# Patient Record
Sex: Female | Born: 1990 | Race: White | Hispanic: No | Marital: Single | Smoking: Former smoker
Health system: Southern US, Community
[De-identification: ages and names within clinical notes are randomized; demographics above are authoritative.]

## PROBLEM LIST (undated history)

## (undated) ENCOUNTER — Inpatient Hospital Stay (HOSPITAL_COMMUNITY): Admission: RE | Payer: Self-pay | Source: Ambulatory Visit

## (undated) ENCOUNTER — Inpatient Hospital Stay (HOSPITAL_COMMUNITY): Payer: Self-pay

## (undated) DIAGNOSIS — Z803 Family history of malignant neoplasm of breast: Secondary | ICD-10-CM

## (undated) DIAGNOSIS — E079 Disorder of thyroid, unspecified: Secondary | ICD-10-CM

## (undated) DIAGNOSIS — B999 Unspecified infectious disease: Secondary | ICD-10-CM

## (undated) DIAGNOSIS — R51 Headache: Secondary | ICD-10-CM

## (undated) DIAGNOSIS — K219 Gastro-esophageal reflux disease without esophagitis: Secondary | ICD-10-CM

## (undated) HISTORY — DX: Family history of malignant neoplasm of breast: Z80.3

## (undated) HISTORY — DX: Gastro-esophageal reflux disease without esophagitis: K21.9

## (undated) HISTORY — PX: MOUTH SURGERY: SHX715

---
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
## 2010-10-03 LAB — URINALYSIS, ROUTINE W REFLEX MICROSCOPIC
Bilirubin Urine: NEGATIVE
Ketones, ur: NEGATIVE
Specific Gravity, Urine: 1.016
Urobilinogen, UA: 1
pH: 6

## 2010-10-03 LAB — URINE MICROSCOPIC-ADD ON

## 2010-10-03 LAB — URINE CULTURE: Colony Count: 50000

## 2012-07-07 LAB — CULTURE, OB URINE: Urine Culture, OB: NEGATIVE

## 2012-07-07 LAB — CYSTIC FIBROSIS DIAGNOSTIC STUDY: INTERPRETATION-CFDNA: NEGATIVE

## 2012-07-07 LAB — OB RESULTS CONSOLE HIV ANTIBODY (ROUTINE TESTING): HIV: NONREACTIVE

## 2012-07-07 LAB — OB RESULTS CONSOLE RPR: RPR: NONREACTIVE

## 2012-07-07 LAB — OB RESULTS CONSOLE GC/CHLAMYDIA
CHLAMYDIA, DNA PROBE: NEGATIVE
GC PROBE AMP, GENITAL: NEGATIVE

## 2012-07-07 LAB — OB RESULTS CONSOLE HEPATITIS B SURFACE ANTIGEN: Hepatitis B Surface Ag: NEGATIVE

## 2012-07-07 LAB — OB RESULTS CONSOLE PLATELET COUNT: Platelets: 243 10*3/uL

## 2012-07-07 LAB — OB RESULTS CONSOLE RUBELLA ANTIBODY, IGM: Rubella: IMMUNE

## 2012-07-07 LAB — OB RESULTS CONSOLE HGB/HCT, BLOOD
HCT: 42 %
Hemoglobin: 14.4 g/dL

## 2012-07-07 LAB — CYTOLOGY - PAP: Pap: NEGATIVE

## 2012-11-24 LAB — OB RESULTS CONSOLE HGB/HCT, BLOOD
HEMATOCRIT: 38 %
Hemoglobin: 12.6 g/dL

## 2013-01-04 ENCOUNTER — Inpatient Hospital Stay (HOSPITAL_COMMUNITY)
Admission: AD | Admit: 2013-01-04 | Discharge: 2013-01-04 | Disposition: A | Discharge status: Home / Self Care | Payer: Medicaid - Out of State | Source: Ambulatory Visit | Attending: Obstetrics & Gynecology | Admitting: Obstetrics & Gynecology

## 2013-01-04 ENCOUNTER — Encounter (HOSPITAL_COMMUNITY): Payer: Self-pay | Admitting: *Deleted

## 2013-01-04 DIAGNOSIS — O4700 False labor before 37 completed weeks of gestation, unspecified trimester: Secondary | ICD-10-CM | POA: Insufficient documentation

## 2013-01-04 DIAGNOSIS — Z87891 Personal history of nicotine dependence: Secondary | ICD-10-CM | POA: Insufficient documentation

## 2013-01-04 DIAGNOSIS — R109 Unspecified abdominal pain: Secondary | ICD-10-CM | POA: Insufficient documentation

## 2013-01-04 DIAGNOSIS — R42 Dizziness and giddiness: Secondary | ICD-10-CM

## 2013-01-04 DIAGNOSIS — N898 Other specified noninflammatory disorders of vagina: Secondary | ICD-10-CM

## 2013-01-04 DIAGNOSIS — M549 Dorsalgia, unspecified: Secondary | ICD-10-CM | POA: Insufficient documentation

## 2013-01-04 HISTORY — DX: Disorder of thyroid, unspecified: E07.9

## 2013-01-04 HISTORY — DX: Headache: R51

## 2013-01-04 LAB — URINE MICROSCOPIC-ADD ON

## 2013-01-04 LAB — WET PREP, GENITAL
Trich, Wet Prep: NONE SEEN
Yeast Wet Prep HPF POC: NONE SEEN

## 2013-01-04 LAB — FETAL FIBRONECTIN: Fetal Fibronectin: NEGATIVE

## 2013-01-04 LAB — URINALYSIS, ROUTINE W REFLEX MICROSCOPIC
Bilirubin Urine: NEGATIVE
Glucose, UA: NEGATIVE mg/dL
Ketones, ur: NEGATIVE mg/dL
pH: 6 (ref 5.0–8.0)

## 2013-01-04 MED ORDER — LACTATED RINGERS IV BOLUS (SEPSIS)
1000.0000 mL | Freq: Once | INTRAVENOUS | Status: AC
  Administered 2013-01-04: 1000 mL via INTRAVENOUS

## 2013-01-04 MED ORDER — METRONIDAZOLE 500 MG PO TABS: 500.0000 mg | ORAL_TABLET | Freq: Two times a day (BID) | ORAL | Status: DC

## 2013-01-04 NOTE — Progress Notes (Signed)
Patient states she had episodes of dizziness and fainting when she was around 22 years old. Unknown etiology. States she had none of this with prior pregnancy. States after she delivered, she had problems with thyroid and was on medication for about 1 year. States she stopped medication on her own due to loss of insurance. At some point when tested again, values were normal so medication not resumed. States she has had episodes of dizziness with this pregnancy and passed out one time.

## 2013-01-04 NOTE — MAU Note (Addendum)
Hasn't been seen in a month, recently moved here.  Sent over by [facility] for check up (they have her prenatal record), does not have an appt yet, waiting on insurance.  Has been having a lot of pain, stretching in upper abd and back pain. Mild UTI symptoms

## 2013-01-04 NOTE — MAU Provider Note (Signed)
History     CSN: [id number]  Arrival date and time: 01/04/13 1352   None     Chief Complaint  Patient presents with  . Abdominal Pain  . Back Pain   HPI Dana Gonzalez is a 22 y.o. G3P1101 at [redacted]w[redacted]d by pt report of R=21. Pt has transferred her care from [facility] and has not yet established care in the area. Pt is currently trying to adjust her medicaid to the area. Pt states she is here to "get checked out." on further review of systems, pt reports feeling dizzy when standing or walking for prolonged periods of time, Pt also reports 1 episode of syncope. Pt reports poor fluid intake. Normal fetal movement, no LOF, no VB, ?ctx. When she walks from >44min she starts having pain in her back that moves to the front of her abdomen.   Pt states that her first pregnancy was induced at 38wks for size and her second pregnancy was and IUFD from an umbilical cord complication at 8 months. Reports no problems with HTN or Diabetes with this pregnancy. Reports not needing thyroid medication this pregnancy as well.  OB History   Grav Para Term Preterm Abortions TAB SAB Ect Mult Living   3 2 1 1      1       Past Medical History  Diagnosis Date  . Thyroid disease   . NWGNFAOZ(308.6)     Past Surgical History  Procedure Laterality Date  . Vaginal delivery      X 2  . Mouth surgery      Family History  Problem Relation Age of Onset  . Cancer Mother   . Hypertension Father   . Diabetes Maternal Grandmother   . Heart disease Paternal Grandmother     History  Substance Use Topics  . Smoking status: Former Smoker    Types: Cigarettes  . Smokeless tobacco: Never Used  . Alcohol Use: No    Allergies: Not on File  Prescriptions prior to admission  Medication Sig Dispense Refill  . calcium carbonate (TUMS - DOSED IN MG ELEMENTAL CALCIUM) 500 MG chewable tablet Chew 2 tablets by mouth at bedtime as needed for indigestion or heartburn.      . Prenatal Vit-Fe Fumarate-FA (PRENATAL  MULTIVITAMIN) TABS tablet Take 1 tablet by mouth daily at 12 noon.        ROS No f/c, sob, n/v, d/c, weakness, cp, headache, urinary symptoms, some constipation  Physical Exam   Blood pressure 118/78, pulse 95, temperature 98.3 F (36.8 C), temperature source Oral, resp. rate 18, height 5\' 5"  (1.651 m), weight 81.647 kg (180 lb), SpO2 99.00%.  Physical Exam VSS, NAD, +orthostatics based on pulse RRR no mgt CTAB no wrc Gravid, NTTP, ND No c/c/e Dilation: 3 Effacement (%): 20 Station: -2 Exam by:: Dr. Ike Gonzalez  SSE: white discharge, thick appearing cervix  FHT: 140s mod var, mult accels >15x15 no decels Toco: q101min ctx  Results for Dana, Gonzalez (MRN [id number]) as of 01/04/2013 15:56  Ref. Range 01/04/2013 14:19 01/04/2013 15:12  Color, Urine Latest Range: YELLOW  YELLOW   APPearance Latest Range: CLEAR  CLEAR   Specific Gravity, Urine Latest Range: 1.005-1.030  1.015   pH Latest Range: 5.0-8.0  6.0   Glucose Latest Range: NEGATIVE mg/dL NEGATIVE   Bilirubin Urine Latest Range: NEGATIVE  NEGATIVE   Ketones, ur Latest Range: NEGATIVE mg/dL NEGATIVE   Protein Latest Range: NEGATIVE mg/dL NEGATIVE   Urobilinogen, UA Latest Range: 0.0-1.0 mg/dL 0.2  Nitrite Latest Range: NEGATIVE  NEGATIVE   Leukocytes, UA Latest Range: NEGATIVE  SMALL (A)   Hgb urine dipstick Latest Range: NEGATIVE  NEGATIVE   WBC, UA Latest Range: <3 WBC/hpf 0-2   Squamous Epithelial / LPF Latest Range: RARE  MANY (A)   Bacteria, UA Latest Range: RARE  FEW (A)   Fetal Fibronectin Latest Range: NEGATIVE   NEGATIVE   Results for Dana, Gonzalez (MRN [id number]) as of 01/04/2013 16:24  Ref. Range 01/04/2013 15:45  Yeast Wet Prep HPF POC Latest Range: NONE SEEN  NONE SEEN  Trich, Wet Prep Latest Range: NONE SEEN  NONE SEEN  Clue Cells Wet Prep HPF POC Latest Range: NONE SEEN  FEW (A)  WBC, Wet Prep HPF POC Latest Range: NONE SEEN  FEW (A)    MAU Course  Procedures  MDM FFN, Wet mount, GC/C Requested  medical records   Assessment and Plan  Dana Gonzalez is a 22 y.o. G3P1101 at [redacted]w[redacted]d with care in [facility]. FFN negative. No steroids at this time. Will send message to our clinic to establish care. Recommend pt decrease her activity to a level below back pain/ctx. Pt to increase hydration with 1/2 gatorade 1/2 water. Discharged at this time.  +clue cells, and discharge, will tx for BV with flagyl 500mg  BID x7d Dana Gonzalez 01/04/2013, 3:48 PM

## 2013-01-04 NOTE — MAU Note (Signed)
Passed out when shopping a couple wks ago,  Any time she is standing or goes for a walk, she gets dizzy

## 2013-01-07 NOTE — L&D Delivery Note (Signed)
Delivery Note At 9:03 AM a viable female was delivered via Vaginal, Spontaneous Delivery (Presentation: Left Occiput Anterior).  APGAR: 9, 9; weight .   Placenta status: Intact, Spontaneous.  Cord: 3 vessels with the following complications: None.   Anesthesia: Epidural  Episiotomy: None Lacerations: 2nd degree Suture Repair: 3.0 vicryl Est. Blood Loss (mL): 350  Mom to postpartum.  Baby to Couplet care / Skin to Skin.  Mother admitted in active labor and was augmented with pit. Progressed to complete. Came to room with 3 sets of contractions delivered NSVD over 2nd degree tear. Active management with pit and traction delivered intact placenta w/ 3v cord. Tear repaired in usual manner with 3.0 vicryl on CT. EBL 350. Uterine sweep with several large clots then hemostatic. Counts correct.  Fredrik Rigger 02/07/2013, 9:26 AM

## 2013-01-10 ENCOUNTER — Inpatient Hospital Stay (HOSPITAL_COMMUNITY)
Admission: AD | Admit: 2013-01-10 | Discharge: 2013-01-11 | Disposition: A | Discharge status: Home / Self Care | Payer: Medicaid - Out of State | Source: Ambulatory Visit | Attending: Obstetrics & Gynecology | Admitting: Obstetrics & Gynecology

## 2013-01-10 ENCOUNTER — Encounter (HOSPITAL_COMMUNITY): Payer: Self-pay | Admitting: *Deleted

## 2013-01-10 DIAGNOSIS — O479 False labor, unspecified: Secondary | ICD-10-CM

## 2013-01-10 DIAGNOSIS — O4700 False labor before 37 completed weeks of gestation, unspecified trimester: Secondary | ICD-10-CM | POA: Insufficient documentation

## 2013-01-10 DIAGNOSIS — Z87891 Personal history of nicotine dependence: Secondary | ICD-10-CM | POA: Insufficient documentation

## 2013-01-10 NOTE — MAU Note (Signed)
Pt G3 P1 at 34.1wks with contractions every 61min.  SVE 3cm on 12/30.

## 2013-01-11 DIAGNOSIS — O479 False labor, unspecified: Secondary | ICD-10-CM

## 2013-01-11 LAB — URINALYSIS, ROUTINE W REFLEX MICROSCOPIC
Bilirubin Urine: NEGATIVE
Glucose, UA: NEGATIVE mg/dL
Hgb urine dipstick: NEGATIVE
Ketones, ur: NEGATIVE mg/dL
Leukocytes, UA: NEGATIVE
NITRITE: NEGATIVE
PROTEIN: NEGATIVE mg/dL
UROBILINOGEN UA: 0.2 mg/dL (ref 0.0–1.0)
pH: 6 (ref 5.0–8.0)

## 2013-01-11 MED ORDER — ONDANSETRON 4 MG PO TBDP
4.0000 mg | ORAL_TABLET | Freq: Once | ORAL | Status: AC
  Administered 2013-01-11: 4 mg via ORAL
  Filled 2013-01-11: qty 1

## 2013-01-11 MED ORDER — OXYCODONE-ACETAMINOPHEN 5-325 MG PO TABS
2.0000 | ORAL_TABLET | Freq: Once | ORAL | Status: AC
  Administered 2013-01-11: 2 via ORAL
  Filled 2013-01-11: qty 2

## 2013-01-11 NOTE — MAU Provider Note (Signed)
History     CSN: [id number]  Arrival date and time: 01/10/13 2313   None     Chief Complaint  Patient presents with  . Contractions   HPI Dana Gonzalez is a 23 y.o. G3P1101 at [redacted]w[redacted]d by pt report of R=21 who presents for 3 hours of regular contractions.  Pt has transferred her care from [facility] and has not yet established care in the area.  Pt reports poor fluid intake- only drinks [place] tea all day long. Normal fetal movement, no LOF, no VB. Was seen in MAU last week and was contracting on toco without feeling them at that time and had a negative FFN.   Pt states that her first pregnancy was induced at 38wks for size and her second pregnancy was and IUFD from an umbilical cord complication at 8 months. Reports no problems with HTN or Diabetes with this pregnancy. Reports not needing thyroid medication this pregnancy as well although has needed it in the past.   OB History   Grav Para Term Preterm Abortions TAB SAB Ect Mult Living   3 2 1 1      1       Past Medical History  Diagnosis Date  . Thyroid disease   . WYOVZCHY(850.2)     Past Surgical History  Procedure Laterality Date  . Vaginal delivery      X 2  . Mouth surgery      Family History  Problem Relation Age of Onset  . Cancer Mother   . Hypertension Father   . Diabetes Maternal Grandmother   . Heart disease Paternal Grandmother     History  Substance Use Topics  . Smoking status: Former Smoker    Types: Cigarettes  . Smokeless tobacco: Never Used  . Alcohol Use: No    Allergies: No Known Allergies  Prescriptions prior to admission  Medication Sig Dispense Refill  . calcium carbonate (TUMS - DOSED IN MG ELEMENTAL CALCIUM) 500 MG chewable tablet Chew 2 tablets by mouth at bedtime as needed for indigestion or heartburn.      . metroNIDAZOLE (FLAGYL) 500 MG tablet Take 1 tablet (500 mg total) by mouth 2 (two) times daily.  14 tablet  0  . Prenatal Vit-Fe Fumarate-FA (PRENATAL MULTIVITAMIN) TABS tablet Take  1 tablet by mouth daily at 12 noon.        ROS No f/c, sob, n/v, d/c, weakness, cp, headache, urinary symptoms, some constipation  Physical Exam   Blood pressure 132/67, pulse 100, temperature 98 F (36.7 C), temperature source Oral, resp. rate 18, height 5\' 7"  (1.702 m), weight 82.736 kg (182 lb 6.4 oz).  Physical Exam VSS, NAD,  RRR no m/r/g CTAB  Gravid, NTTP, ND No c/c/e Dilation: 3 Effacement (%): 30 Station: -3 Presentation: Vertex Exam by:: Dana Becton MD   FHT: 145, mod var, mult accels >15x15 no decels Toco: q2-42min ctx  Results for HELIA, HAESE (MRN [id number]) as of 01/04/2013 15:56  Ref. Range 01/04/2013 14:19 01/04/2013 15:12  Color, Urine Latest Range: YELLOW  YELLOW   APPearance Latest Range: CLEAR  CLEAR   Specific Gravity, Urine Latest Range: 1.005-1.030  1.015   pH Latest Range: 5.0-8.0  6.0   Glucose Latest Range: NEGATIVE mg/dL NEGATIVE   Bilirubin Urine Latest Range: NEGATIVE  NEGATIVE   Ketones, ur Latest Range: NEGATIVE mg/dL NEGATIVE   Protein Latest Range: NEGATIVE mg/dL NEGATIVE   Urobilinogen, UA Latest Range: 0.0-1.0 mg/dL 0.2   Nitrite Latest Range: NEGATIVE  NEGATIVE  Leukocytes, UA Latest Range: NEGATIVE  SMALL (A)   Hgb urine dipstick Latest Range: NEGATIVE  NEGATIVE   WBC, UA Latest Range: <3 WBC/hpf 0-2   Squamous Epithelial / LPF Latest Range: RARE  MANY (A)   Bacteria, UA Latest Range: RARE  FEW (A)   Fetal Fibronectin Latest Range: NEGATIVE   NEGATIVE   Results for PHILLIP, SANDLER (MRN [id number]) as of 01/04/2013 16:24  Ref. Range 01/04/2013 15:45  Yeast Wet Prep HPF POC Latest Range: NONE SEEN  NONE SEEN  Trich, Wet Prep Latest Range: NONE SEEN  NONE SEEN  Clue Cells Wet Prep HPF POC Latest Range: NONE SEEN  FEW (A)  WBC, Wet Prep HPF POC Latest Range: NONE SEEN  FEW (A)    MAU Course  Procedures     Assessment and Plan  Dana Gonzalez is a 23 y.o. G3P1101 at [redacted]w[redacted]d with care in [facility] and here for contraction pain.    Cervix unchanged from one week ago No indications for steroids, FFN or tocolysis given GA.  Based on story, suspect some component of dehydration and too much caffeine Po hydrated here in the MAU with improvement in symptoms but continued contractions on TOCO - pt comfortable though and palpating mild on exam Cervix rechecked in 2 hours and unchanged- likely braxton hicks Finish last day of flagyl for BV.  - cont rehydrating with things that are noncaffeinated  F/u as scheduled in [facility] on 1/7 FWB- cat I tracing    PTL precautions discussed   Rip Hawes L 01/11/2013, 12:27 AM

## 2013-01-11 NOTE — Discharge Instructions (Signed)

## 2013-01-12 NOTE — MAU Provider Note (Signed)
Attestation of Attending Supervision of Fellow: Evaluation and management procedures were performed by the Fellow under my supervision and collaboration.  I have reviewed the Fellow's note and chart, and I agree with the management and plan.    

## 2013-01-13 ENCOUNTER — Encounter: Payer: Self-pay | Admitting: Family Medicine

## 2013-01-21 ENCOUNTER — Inpatient Hospital Stay (HOSPITAL_COMMUNITY)
Admission: AD | Admit: 2013-01-21 | Discharge: 2013-01-21 | Disposition: A | Discharge status: Home / Self Care | Payer: Medicaid Other | Source: Ambulatory Visit | Attending: Obstetrics & Gynecology | Admitting: Obstetrics & Gynecology

## 2013-01-21 ENCOUNTER — Inpatient Hospital Stay (HOSPITAL_COMMUNITY): Payer: Medicaid Other

## 2013-01-21 ENCOUNTER — Telehealth: Payer: Self-pay | Admitting: General Practice

## 2013-01-21 ENCOUNTER — Encounter (HOSPITAL_COMMUNITY): Payer: Self-pay | Admitting: *Deleted

## 2013-01-21 DIAGNOSIS — R109 Unspecified abdominal pain: Secondary | ICD-10-CM | POA: Insufficient documentation

## 2013-01-21 DIAGNOSIS — O4690 Antepartum hemorrhage, unspecified, unspecified trimester: Secondary | ICD-10-CM

## 2013-01-21 DIAGNOSIS — O4700 False labor before 37 completed weeks of gestation, unspecified trimester: Secondary | ICD-10-CM | POA: Insufficient documentation

## 2013-01-21 LAB — CBC
HCT: 35.6 % — ABNORMAL LOW (ref 36.0–46.0)
Hemoglobin: 12.4 g/dL (ref 12.0–15.0)
MCH: 32.9 pg (ref 26.0–34.0)
MCHC: 34.8 g/dL (ref 30.0–36.0)
MCV: 94.4 fL (ref 78.0–100.0)
PLATELETS: 180 10*3/uL (ref 150–400)
RBC: 3.77 MIL/uL — AB (ref 3.87–5.11)
RDW: 14 % (ref 11.5–15.5)
WBC: 8.6 10*3/uL (ref 4.0–10.5)

## 2013-01-21 LAB — URINALYSIS, ROUTINE W REFLEX MICROSCOPIC
BILIRUBIN URINE: NEGATIVE
GLUCOSE, UA: NEGATIVE mg/dL
KETONES UR: NEGATIVE mg/dL
Nitrite: NEGATIVE
PH: 7.5 (ref 5.0–8.0)
PROTEIN: NEGATIVE mg/dL
Specific Gravity, Urine: <1.005 — ABNORMAL LOW (ref 1.005–1.030)
Urobilinogen, UA: 0.2 mg/dL (ref 0.0–1.0)

## 2013-01-21 LAB — URINE MICROSCOPIC-ADD ON

## 2013-01-21 LAB — TYPE AND SCREEN
ABO/RH(D): A POS
Antibody Screen: NEGATIVE

## 2013-01-21 LAB — ABO/RH: ABO/RH(D): A POS

## 2013-01-21 NOTE — MAU Note (Signed)
Pt reports having some vaginal bleeding that started today. reports some mild cramping and good fetal movement.

## 2013-01-21 NOTE — MAU Provider Note (Signed)
None     Chief Complaint:  Vaginal Bleeding   Dana Gonzalez is  23 y.o. G3P1101 at [redacted]w[redacted]d presents complaining of Vaginal Bleeding Pt reports today she had vaginal bleeding on wiping after urination. No blood in the toilet only on TP. Pt reports mild cramping, excellent fetal movement, no LOF. Pt has hx of third trimester IUFD at ~34wk. Pt here for evaluation of bleeding  Otherwise pt reports in normal state of health. Recently transferred care to [facility] from out of state and has not been seen at clinic yet. Review of patient records unable to find blood type or 20wk [place].  Obstetrical/Gynecological History: OB History   Grav Para Term Preterm Abortions TAB SAB Ect Mult Living   3 2 1 1      1      Past Medical History: Past Medical History  Diagnosis Date  . Thyroid disease   . ONGEXBMW(413.2)     Past Surgical History: Past Surgical History  Procedure Laterality Date  . Vaginal delivery      X 2  . Mouth surgery      Family History: Family History  Problem Relation Age of Onset  . Cancer Mother   . Hypertension Father   . Diabetes Maternal Grandmother   . Heart disease Paternal Grandmother     Social History: History  Substance Use Topics  . Smoking status: Former Smoker    Types: Cigarettes  . Smokeless tobacco: Never Used  . Alcohol Use: No    Allergies: No Known Allergies  Meds:  Prescriptions prior to admission  Medication Sig Dispense Refill  . calcium carbonate (TUMS - DOSED IN MG ELEMENTAL CALCIUM) 500 MG chewable tablet Chew 2 tablets by mouth at bedtime as needed for indigestion or heartburn.      . Prenatal Vit-Fe Fumarate-FA (PRENATAL MULTIVITAMIN) TABS tablet Take 1 tablet by mouth daily at 12 noon.        Review of Systems -   Review of Systems  No f/c, sob, nv, d/c, ha, weaknnes, CP, urinary symptoms or other complaints at [facility] stime.   Physical Exam  Blood pressure 107/71, pulse 121, temperature 98.2 F (36.8 C), temperature source  Oral, resp. rate 18. GENERAL: Well-developed, well-nourished female in no acute distress.  LUNGS: Clear to auscultation bilaterally.  HEART: Regular rate and rhythm. ABDOMEN: Soft, nontender, nondistended, gravid.  EXTREMITIES: Nontender, no edema, 2+ distal pulses. Dilation: 1 Effacement (%): 30 Cervical Position: Posterior Station: -3 Presentation: Vertex Exam by:: Dr. Leslie Andrea  Presentation: cephalic  SSE: dark red blood in mucous from cervical Os. Os appeared closed  FHT:  Baseline rate 150-160s bpm   Variability moderate  Accelerations present   Decelerations none Contractions: uterine irritability   Labs: Results for orders placed during the hospital encounter of 01/21/13 (from the past 24 hour(s))  CBC   Collection Time    01/21/13  1:25 PM      Result Value Range   WBC 8.6  4.0 - 10.5 K/uL   RBC 3.77 (*) 3.87 - 5.11 MIL/uL   Hemoglobin 12.4  12.0 - 15.0 g/dL   HCT 35.6 (*) 36.0 - 46.0 %   MCV 94.4  78.0 - 100.0 fL   MCH 32.9  26.0 - 34.0 pg   MCHC 34.8  30.0 - 36.0 g/dL   RDW 14.0  11.5 - 15.5 %   Platelets 180  150 - 400 K/uL   Imaging Studies:  No results found.  MDM: unable to find [place]  or blood type from [facility]. Will eval for palcenta location and for abruption. Will also collect blood type for possible rhogam.  Assessment: Dana Gonzalez is  23 y.o. G3P1101 at [redacted]w[redacted]d presents with minimal vaginal bleeding from the OS. Likely related to cervical change. Reassuring placenta location, non painful, low concern for abruption. Reassuring fetal status. Encouraged pt to have strict PTl precautions. Pt comfortable with discharge.  Discussed with Dr. Fanny Dance, Lyda Kalata 1/15/20151:48 PM

## 2013-01-21 NOTE — Discharge Instructions (Signed)
Vaginal Bleeding During Pregnancy, Third Trimester A small amount of bleeding (spotting) from the vagina is relatively common in pregnancy. Various things can cause bleeding or spotting in pregnancy. Sometimes the bleeding is normal and is not a problem. However, bleeding during the third trimester can also be a sign of something serious for the mother and the baby. Be sure to tell your health care provider about any vaginal bleeding right away.  Some possible causes of vaginal bleeding during the third trimester include:   The placenta may be partially or completely covering the opening to the cervix (placenta previa).   The placenta may have separated from the uterus (abruption of the placenta).   There may be an infection or growth on the cervix.   You may be starting labor, called discharging of the mucus plug.   The placenta may grow into the muscle layer of the uterus (placenta accreta).  HOME CARE INSTRUCTIONS  Watch your condition for any changes. The following actions may help to lessen any discomfort you are feeling:   Follow your health care provider's instructions for limiting your activity. If your health care provider orders bed rest, you may need to stay in bed and only get up to use the bathroom. However, your health care provider may allow you to continue light activity.  If needed, make plans for someone to help with your regular activities and responsibilities while you are on bed rest.  Keep track of the number of pads you use each day, how often you change pads, and how soaked (saturated) they are. Write this down.  Do not use tampons. Do not douche.  Do not have sexual intercourse or orgasms until approved by your health care provider.  Follow your health care provider's advice about lifting, driving, and physical activities.  If you pass any tissue from your vagina, save the tissue so you can show it to your health care provider.   Only take over-the-counter  or prescription medicines as directed by your health care provider.  Do not take aspirin because it can make you bleed.   Keep all follow-up appointments as directed by your health care provider. SEEK MEDICAL CARE IF:  You have any vaginal bleeding during any part of your pregnancy.  You have cramps or labor pains. SEEK IMMEDIATE MEDICAL CARE IF:   You have severe cramps or pain in your back or belly (abdomen).  You have a fever, not controlled by medicine.  You have chills.  You have a gush of fluid from the vagina.  You pass large clots or tissue from your vagina.  Your bleeding increases.  You feel lightheaded or weak.  You pass out.  You feel less movement or no movement of the baby.  MAKE SURE YOU:  Understand these instructions.  Will watch your condition.  Will get help right away if you are not doing well or get worse. Document Released: 03/16/2002 Document Revised: 10/14/2012 Document Reviewed: 08/31/2012 Exit[employer] Patient Information 2014 [employer].  

## 2013-01-21 NOTE — Telephone Encounter (Signed)
Patient called in to front office stating she just went to the bathroom and saw some bright red blood when she wiped and she hasn't been seen here in our office yet but has an appt. Asked patient to quantify the amount of blood and if she had had sex recently. Patient stated she hasn't had sex recently and there was enough blood to worry her. Encouraged her to go to MAU for evaluation to see what's going on but not to worry at this point. Patient verbalized understanding stated she would go and had no further questions

## 2013-01-25 ENCOUNTER — Encounter: Payer: Self-pay | Admitting: Obstetrics and Gynecology

## 2013-01-25 ENCOUNTER — Ambulatory Visit (INDEPENDENT_AMBULATORY_CARE_PROVIDER_SITE_OTHER): Payer: Medicaid Other | Admitting: Obstetrics and Gynecology

## 2013-01-25 VITALS — BP 121/79 | Temp 97.0°F | Wt 181.7 lb

## 2013-01-25 DIAGNOSIS — O0933 Supervision of pregnancy with insufficient antenatal care, third trimester: Secondary | ICD-10-CM | POA: Insufficient documentation

## 2013-01-25 DIAGNOSIS — O0993 Supervision of high risk pregnancy, unspecified, third trimester: Secondary | ICD-10-CM | POA: Insufficient documentation

## 2013-01-25 DIAGNOSIS — O0930 Supervision of pregnancy with insufficient antenatal care, unspecified trimester: Secondary | ICD-10-CM

## 2013-01-25 DIAGNOSIS — Z8759 Personal history of other complications of pregnancy, childbirth and the puerperium: Secondary | ICD-10-CM | POA: Insufficient documentation

## 2013-01-25 DIAGNOSIS — O3660X0 Maternal care for excessive fetal growth, unspecified trimester, not applicable or unspecified: Secondary | ICD-10-CM

## 2013-01-25 DIAGNOSIS — O09299 Supervision of pregnancy with other poor reproductive or obstetric history, unspecified trimester: Secondary | ICD-10-CM

## 2013-01-25 LAB — POCT URINALYSIS DIP (DEVICE)
Bilirubin Urine: NEGATIVE
GLUCOSE, UA: NEGATIVE mg/dL
KETONES UR: NEGATIVE mg/dL
Nitrite: NEGATIVE
Protein, ur: NEGATIVE mg/dL
SPECIFIC GRAVITY, URINE: 1.015 (ref 1.005–1.030)
Urobilinogen, UA: 0.2 mg/dL (ref 0.0–1.0)
pH: 7 (ref 5.0–8.0)

## 2013-01-25 LAB — OB RESULTS CONSOLE GBS: GBS: POSITIVE

## 2013-01-25 NOTE — Patient Instructions (Signed)
Third Trimester of Pregnancy The third trimester is from week 29 through week 42, months 7 through 9. The third trimester is a time when the fetus is growing rapidly. At the end of the ninth month, the fetus is about 20 inches in length and weighs 6 10 pounds.  BODY CHANGES Your body goes through many changes during pregnancy. The changes vary from woman to woman.   Your weight will continue to increase. You can expect to gain 25 35 pounds (11 16 kg) by the end of the pregnancy.  You may begin to get stretch marks on your hips, abdomen, and breasts.  You may urinate more often because the fetus is moving lower into your pelvis and pressing on your bladder.  You may develop or continue to have heartburn as a result of your pregnancy.  You may develop constipation because certain hormones are causing the muscles that push waste through your intestines to slow down.  You may develop hemorrhoids or swollen, bulging veins (varicose veins).  You may have pelvic pain because of the weight gain and pregnancy hormones relaxing your joints between the bones in your pelvis. Back aches may result from over exertion of the muscles supporting your posture.  Your breasts will continue to grow and be tender. A yellow discharge may leak from your breasts called colostrum.  Your belly button may stick out.  You may feel short of breath because of your expanding uterus.  You may notice the fetus "dropping," or moving lower in your abdomen.  You may have a bloody mucus discharge. This usually occurs a few days to a week before labor begins.  Your cervix becomes thin and soft (effaced) near your due date. WHAT TO EXPECT AT YOUR PRENATAL EXAMS  You will have prenatal exams every 2 weeks until week 36. Then, you will have weekly prenatal exams. During a routine prenatal visit:  You will be weighed to make sure you and the fetus are growing normally.  Your blood pressure is taken.  Your abdomen will  be measured to track your baby's growth.  The fetal heartbeat will be listened to.  Any test results from the previous visit will be discussed.  You may have a cervical check near your due date to see if you have effaced. At around 36 weeks, your caregiver will check your cervix. At the same time, your caregiver will also perform a test on the secretions of the vaginal tissue. This test is to determine if a type of bacteria, Group B streptococcus, is present. Your caregiver will explain this further. Your caregiver may ask you:  What your birth plan is.  How you are feeling.  If you are feeling the baby move.  If you have had any abnormal symptoms, such as leaking fluid, bleeding, severe headaches, or abdominal cramping.  If you have any questions. Other tests or screenings that may be performed during your third trimester include:  Blood tests that check for low iron levels (anemia).  Fetal testing to check the health, activity level, and growth of the fetus. Testing is done if you have certain medical conditions or if there are problems during the pregnancy. FALSE LABOR You may feel small, irregular contractions that eventually go away. These are called Braxton Hicks contractions, or false labor. Contractions may last for hours, days, or even weeks before true labor sets in. If contractions come at regular intervals, intensify, or become painful, it is best to be seen by your caregiver.    SIGNS OF LABOR   Menstrual-like cramps.  Contractions that are 5 minutes apart or less.  Contractions that start on the top of the uterus and spread down to the lower abdomen and back.  A sense of increased pelvic pressure or back pain.  A watery or bloody mucus discharge that comes from the vagina. If you have any of these signs before the 37th week of pregnancy, call your caregiver right away. You need to go to the hospital to get checked immediately. HOME CARE INSTRUCTIONS   Avoid all  smoking, herbs, alcohol, and unprescribed drugs. These chemicals affect the formation and growth of the baby.  Follow your caregiver's instructions regarding medicine use. There are medicines that are either safe or unsafe to take during pregnancy.  Exercise only as directed by your caregiver. Experiencing uterine cramps is a good sign to stop exercising.  Continue to eat regular, healthy meals.  Wear a good support bra for breast tenderness.  Do not use hot tubs, steam rooms, or saunas.  Wear your seat belt at all times when driving.  Avoid raw meat, uncooked cheese, cat litter boxes, and soil used by cats. These carry germs that can cause birth defects in the baby.  Take your prenatal vitamins.  Try taking a stool softener (if your caregiver approves) if you develop constipation. Eat more high-fiber foods, such as fresh vegetables or fruit and whole grains. Drink plenty of fluids to keep your urine clear or pale yellow.  Take warm sitz baths to soothe any pain or discomfort caused by hemorrhoids. Use hemorrhoid cream if your caregiver approves.  If you develop varicose veins, wear support hose. Elevate your feet for 15 minutes, 3 4 times a day. Limit salt in your diet.  Avoid heavy lifting, wear low heal shoes, and practice good posture.  Rest a lot with your legs elevated if you have leg cramps or low back pain.  Visit your dentist if you have not gone during your pregnancy. Use a soft toothbrush to brush your teeth and be gentle when you floss.  A sexual relationship may be continued unless your caregiver directs you otherwise.  Do not travel far distances unless it is absolutely necessary and only with the approval of your caregiver.  Take prenatal classes to understand, practice, and ask questions about the labor and delivery.  Make a trial run to the hospital.  Pack your hospital bag.  Prepare the baby's nursery.  Continue to go to all your prenatal visits as directed  by your caregiver. SEEK MEDICAL CARE IF:  You are unsure if you are in labor or if your water has broken.  You have dizziness.  You have mild pelvic cramps, pelvic pressure, or nagging pain in your abdominal area.  You have persistent nausea, vomiting, or diarrhea.  You have a bad smelling vaginal discharge.  You have pain with urination. SEEK IMMEDIATE MEDICAL CARE IF:   You have a fever.  You are leaking fluid from your vagina.  You have spotting or bleeding from your vagina.  You have severe abdominal cramping or pain.  You have rapid weight loss or gain.  You have shortness of breath with chest pain.  You notice sudden or extreme swelling of your face, hands, ankles, feet, or legs.  You have not felt your baby move in over an hour.  You have severe headaches that do not go away with medicine.  You have vision changes. Document Released: 12/18/2000 Document Revised: 08/26/2012 Document Reviewed:   02/25/2012 [employer] Patient Information 2014 [place].  Contraception Choices Contraception (birth control) is the use of any methods or devices to prevent pregnancy. Below are some methods to help avoid pregnancy. HORMONAL METHODS   Contraceptive implant This is a thin, plastic tube containing progesterone hormone. It does not contain estrogen hormone. Your health care provider inserts the tube in the inner part of the upper arm. The tube can remain in place for up to 3 years. After 3 years, the implant must be removed. The implant prevents the ovaries from releasing an egg (ovulation), thickens the cervical mucus to prevent sperm from entering the uterus, and thins the lining of the inside of the uterus.  Progesterone-only injections These injections are given every 3 months by your health care provider to prevent pregnancy. This synthetic progesterone hormone stops the ovaries from releasing eggs. It also thickens cervical mucus and changes the uterine lining. This  makes it harder for sperm to survive in the uterus.  Birth control pills These pills contain estrogen and progesterone hormone. They work by preventing the ovaries from releasing eggs (ovulation). They also cause the cervical mucus to thicken, preventing the sperm from entering the uterus. Birth control pills are prescribed by a health care provider.Birth control pills can also be used to treat heavy periods.  Minipill This type of birth control pill contains only the progesterone hormone. They are taken every day of each month and must be prescribed by your health care provider.  Birth control patch The patch contains hormones similar to those in birth control pills. It must be changed once a week and is prescribed by a health care provider.  Vaginal ring The ring contains hormones similar to those in birth control pills. It is left in the vagina for 3 weeks, removed for 1 week, and then a new one is put back in place. The patient must be comfortable inserting and removing the ring from the vagina.A health care provider's prescription is necessary.  Emergency contraception Emergency contraceptives prevent pregnancy after unprotected sexual intercourse. This pill can be taken right after sex or up to 5 days after unprotected sex. It is most effective the sooner you take the pills after having sexual intercourse. Most emergency contraceptive pills are available without a prescription. Check with your pharmacist. Do not use emergency contraception as your only form of birth control. BARRIER METHODS   Female condom This is a thin sheath (latex or rubber) that is worn over the penis during sexual intercourse. It can be used with spermicide to increase effectiveness.  Female condom. This is a soft, loose-fitting sheath that is put into the vagina before sexual intercourse.  Diaphragm This is a soft, latex, dome-shaped barrier that must be fitted by a health care provider. It is inserted into the vagina,  along with a spermicidal jelly. It is inserted before intercourse. The diaphragm should be left in the vagina for 6 to 8 hours after intercourse.  Cervical cap This is a round, soft, latex or plastic cup that fits over the cervix and must be fitted by a health care provider. The cap can be left in place for up to 48 hours after intercourse.  Sponge This is a soft, circular piece of polyurethane foam. The sponge has spermicide in it. It is inserted into the vagina after wetting it and before sexual intercourse.  Spermicides These are chemicals that kill or block sperm from entering the cervix and uterus. They come in the form of creams, jellies,  suppositories, foam, or tablets. They do not require a prescription. They are inserted into the vagina with an applicator before having sexual intercourse. The process must be repeated every time you have sexual intercourse. INTRAUTERINE CONTRACEPTION  Intrauterine device (IUD) This is a T-shaped device that is put in a woman's uterus during a menstrual period to prevent pregnancy. There are 2 types:  Copper IUD This type of IUD is wrapped in copper wire and is placed inside the uterus. Copper makes the uterus and fallopian tubes produce a fluid that kills sperm. It can stay in place for 10 years.  Hormone IUD This type of IUD contains the hormone progestin (synthetic progesterone). The hormone thickens the cervical mucus and prevents sperm from entering the uterus, and it also thins the uterine lining to prevent implantation of a fertilized egg. The hormone can weaken or kill the sperm that get into the uterus. It can stay in place for 3 5 years, depending on which type of IUD is used. PERMANENT METHODS OF CONTRACEPTION  Female tubal ligation This is when the woman's fallopian tubes are surgically sealed, tied, or blocked to prevent the egg from traveling to the uterus.  Hysteroscopic sterilization This involves placing a small coil or insert into each  fallopian tube. Your doctor uses a technique called hysteroscopy to do the procedure. The device causes scar tissue to form. This results in permanent blockage of the fallopian tubes, so the sperm cannot fertilize the egg. It takes about 3 months after the procedure for the tubes to become blocked. You must use another form of birth control for these 3 months.  Female sterilization This is when the female has the tubes that carry sperm tied off (vasectomy).This blocks sperm from entering the vagina during sexual intercourse. After the procedure, the man can still ejaculate fluid (semen). NATURAL PLANNING METHODS  Natural family planning This is not having sexual intercourse or using a barrier method (condom, diaphragm, cervical cap) on days the woman could become pregnant.  Calendar method This is keeping track of the length of each menstrual cycle and identifying when you are fertile.  Ovulation method This is avoiding sexual intercourse during ovulation.  Symptothermal method This is avoiding sexual intercourse during ovulation, using a thermometer and ovulation symptoms.  Post ovulation method This is timing sexual intercourse after you have ovulated. Regardless of which type or method of contraception you choose, it is important that you use condoms to protect against the transmission of sexually transmitted infections (STIs). Talk with your health care provider about which form of contraception is most appropriate for you. Document Released: 12/24/2004 Document Revised: 08/26/2012 Document Reviewed: 06/18/2012 [facility] Patient Information 2014 [place].  Breastfeeding Deciding to breastfeed is one of the best choices you can make for you and your baby. A change in hormones during pregnancy causes your breast tissue to grow and increases the number and size of your milk ducts. These hormones also allow proteins, sugars, and fats from your blood supply to make breast milk in your  milk-producing glands. Hormones prevent breast milk from being released before your baby is born as well as prompt milk flow after birth. Once breastfeeding has begun, thoughts of your baby, as well as his or her sucking or crying, can stimulate the release of milk from your milk-producing glands.  BENEFITS OF BREASTFEEDING For Your Baby  Your first milk (colostrum) helps your baby's digestive system function better.   There are antibodies in your milk that help your baby  fight off infections.   Your baby has a lower incidence of asthma, allergies, and sudden infant death syndrome.   The nutrients in breast milk are better for your baby than infant formulas and are designed uniquely for your baby's needs.   Breast milk improves your baby's brain development.   Your baby is less likely to develop other conditions, such as childhood obesity, asthma, or type 2 diabetes mellitus.  For You   Breastfeeding helps to create a very special bond between you and your baby.   Breastfeeding is convenient. Breast milk is always available at the correct temperature and costs nothing.   Breastfeeding helps to burn calories and helps you lose the weight gained during pregnancy.   Breastfeeding makes your uterus contract to its prepregnancy size faster and slows bleeding (lochia) after you give birth.   Breastfeeding helps to lower your risk of developing type 2 diabetes mellitus, osteoporosis, and breast or ovarian cancer later in life. SIGNS THAT YOUR BABY IS HUNGRY Early Signs of Hunger  Increased alertness or activity.  Stretching.  Movement of the head from side to side.  Movement of the head and opening of the mouth when the corner of the mouth or cheek is stroked (rooting).  Increased sucking sounds, smacking lips, cooing, sighing, or squeaking.  Hand-to-mouth movements.  Increased sucking of fingers or hands. Late Signs of Hunger  Fussing.  Intermittent crying. Extreme  Signs of Hunger Signs of extreme hunger will require calming and consoling before your baby will be able to breastfeed successfully. Do not wait for the following signs of extreme hunger to occur before you initiate breastfeeding:   Restlessness.  A loud, strong cry.   Screaming. BREASTFEEDING BASICS Breastfeeding Initiation  Find a comfortable place to sit or lie down, with your neck and back well supported.  Place a pillow or rolled up blanket under your baby to bring him or her to the level of your breast (if you are seated). Nursing pillows are specially designed to help support your arms and your baby while you breastfeed.  Make sure that your baby's abdomen is facing your abdomen.   Gently massage your breast. With your fingertips, massage from your chest wall toward your nipple in a circular motion. This encourages milk flow. You may need to continue this action during the feeding if your milk flows slowly.  Support your breast with 4 fingers underneath and your thumb above your nipple. Make sure your fingers are well away from your nipple and your baby's mouth.   Stroke your baby's lips gently with your finger or nipple.   When your baby's mouth is open wide enough, quickly bring your baby to your breast, placing your entire nipple and as much of the colored area around your nipple (areola) as possible into your baby's mouth.   More areola should be visible above your baby's upper lip than below the lower lip.   Your baby's tongue should be between his or her lower gum and your breast.   Ensure that your baby's mouth is correctly positioned around your nipple (latched). Your baby's lips should create a seal on your breast and be turned out (everted).  It is common for your baby to suck about 2 3 minutes in order to start the flow of breast milk. Latching Teaching your baby how to latch on to your breast properly is very important. An improper latch can cause nipple  pain and decreased milk supply for you and poor weight  gain in your baby. Also, if your baby is not latched onto your nipple properly, he or she may swallow some air during feeding. This can make your baby fussy. Burping your baby when you switch breasts during the feeding can help to get rid of the air. However, teaching your baby to latch on properly is still the best way to prevent fussiness from swallowing air while breastfeeding. Signs that your baby has successfully latched on to your nipple:    Silent tugging or silent sucking, without causing you pain.   Swallowing heard between every 3 4 sucks.    Muscle movement above and in front of his or her ears while sucking.  Signs that your baby has not successfully latched on to nipple:   Sucking sounds or smacking sounds from your baby while breastfeeding.  Nipple pain. If you think your baby has not latched on correctly, slip your finger into the corner of your baby's mouth to break the suction and place it between your baby's gums. Attempt breastfeeding initiation again. Signs of Successful Breastfeeding Signs from your baby:   A gradual decrease in the number of sucks or complete cessation of sucking.   Falling asleep.   Relaxation of his or her body.   Retention of a small amount of milk in his or her mouth.   Letting go of your breast by himself or herself. Signs from you:  Breasts that have increased in firmness, weight, and size 1 3 hours after feeding.   Breasts that are softer immediately after breastfeeding.  Increased milk volume, as well as a change in milk consistency and color by the 5th day of breastfeeding.   Nipples that are not sore, cracked, or bleeding. Signs That Your [employer] is Getting Enough Milk  Wetting at least 3 diapers in a 24-hour period. The urine should be clear and pale yellow by age 33 days.  At least 3 stools in a 24-hour period by age 33 days. The stool should be soft and yellow.  At  least 3 stools in a 24-hour period by age 25 days. The stool should be seedy and yellow.  No loss of weight greater than 10% of birth weight during the first 39 days of age.  Average weight gain of 4 7 ounces (120 210 mL) per week after age 22 days.  Consistent daily weight gain by age 20 days, without weight loss after the age of 2 weeks. After a feeding, your baby may spit up a small amount. This is common. BREASTFEEDING FREQUENCY AND DURATION Frequent feeding will help you make more milk and can prevent sore nipples and breast engorgement. Breastfeed when you feel the need to reduce the fullness of your breasts or when your baby shows signs of hunger. This is called "breastfeeding on demand." Avoid introducing a pacifier to your baby while you are working to establish breastfeeding (the first 4 6 weeks after your baby is born). After this time you may choose to use a pacifier. Research has shown that pacifier use during the first year of a baby's life decreases the risk of sudden infant death syndrome (SIDS). Allow your baby to feed on each breast as long as he or she wants. Breastfeed until your baby is finished feeding. When your baby unlatches or falls asleep while feeding from the first breast, offer the second breast. Because newborns are often sleepy in the first few weeks of life, you may need to awaken your baby to get him or  her to feed. Breastfeeding times will vary from baby to baby. However, the following rules can serve as a guide to help you ensure that your baby is properly fed:  Newborns (babies 64 weeks of age or younger) may breastfeed every 1 3 hours.  Newborns should not go longer than 3 hours during the day or 5 hours during the night without breastfeeding.  You should breastfeed your baby a minimum of 8 times in a 24-hour period until you begin to introduce solid foods to your baby at around 74 months of age. BREAST MILK PUMPING Pumping and storing breast milk allows you to  ensure that your baby is exclusively fed your breast milk, even at times when you are unable to breastfeed. This is especially important if you are going back to work while you are still breastfeeding or when you are not able to be present during feedings. Your lactation consultant can give you guidelines on how long it is safe to store breast milk.  A breast pump is a machine that allows you to pump milk from your breast into a sterile bottle. The pumped breast milk can then be stored in a refrigerator or freezer. Some breast pumps are operated by hand, while others use electricity. Ask your lactation consultant which type will work best for you. Breast pumps can be purchased, but some hospitals and breastfeeding support groups lease breast pumps on a monthly basis. A lactation consultant can teach you how to hand express breast milk, if you prefer not to use a pump.  CARING FOR YOUR BREASTS WHILE YOU BREASTFEED Nipples can become dry, cracked, and sore while breastfeeding. The following recommendations can help keep your breasts moisturized and healthy:  Avoid using soap on your nipples.   Wear a supportive bra. Although not required, special nursing bras and tank tops are designed to allow access to your breasts for breastfeeding without taking off your entire bra or top. Avoid wearing underwire style bras or extremely tight bras.  Air dry your nipples for 3 81minutes after each feeding.   Use only cotton bra pads to absorb leaked breast milk. Leaking of breast milk between feedings is normal.   Use lanolin on your nipples after breastfeeding. Lanolin helps to maintain your skin's normal moisture barrier. If you use pure lanolin you do not need to wash it off before feeding your baby again. Pure lanolin is not toxic to your baby. You may also hand express a few drops of breast milk and gently massage that milk into your nipples and allow the milk to air dry. In the first few weeks after giving  birth, some women experience extremely full breasts (engorgement). Engorgement can make your breasts feel heavy, warm, and tender to the touch. Engorgement peaks within 3 5 days after you give birth. The following recommendations can help ease engorgement:  Completely empty your breasts while breastfeeding or pumping. You may want to start by applying warm, moist heat (in the shower or with warm water-soaked hand towels) just before feeding or pumping. This increases circulation and helps the milk flow. If your baby does not completely empty your breasts while breastfeeding, pump any extra milk after he or she is finished.  Wear a snug bra (nursing or regular) or tank top for 1 2 days to signal your body to slightly decrease milk production.  Apply ice packs to your breasts, unless this is too uncomfortable for you.  Make sure that your baby is latched on and positioned  properly while breastfeeding. If engorgement persists after 48 hours of following these recommendations, contact your health care provider or a Science writer. OVERALL HEALTH CARE RECOMMENDATIONS WHILE BREASTFEEDING  Eat healthy foods. Alternate between meals and snacks, eating 3 of each per day. Because what you eat affects your breast milk, some of the foods may make your baby more irritable than usual. Avoid eating these foods if you are sure that they are negatively affecting your baby.  Drink milk, fruit juice, and water to satisfy your thirst (about 10 glasses a day).   Rest often, relax, and continue to take your prenatal vitamins to prevent fatigue, stress, and anemia.  Continue breast self-awareness checks.  Avoid chewing and smoking tobacco.  Avoid alcohol and drug use. Some medicines that may be harmful to your baby can pass through breast milk. It is important to ask your health care provider before taking any medicine, including all over-the-counter and prescription medicine as well as vitamin and herbal  supplements. It is possible to become pregnant while breastfeeding. If birth control is desired, ask your health care provider about options that will be safe for your baby. SEEK MEDICAL CARE IF:   You feel like you want to stop breastfeeding or have become frustrated with breastfeeding.  You have painful breasts or nipples.  Your nipples are cracked or bleeding.  Your breasts are red, tender, or warm.  You have a swollen area on either breast.  You have a fever or chills.  You have nausea or vomiting.  You have drainage other than breast milk from your nipples.  Your breasts do not become full before feedings by the 5th day after you give birth.  You feel sad and depressed.  Your baby is too sleepy to eat well.  Your baby is having trouble sleeping.   Your baby is wetting less than 3 diapers in a 24-hour period.  Your baby has less than 3 stools in a 24-hour period.  Your baby's skin or the white part of his or her eyes becomes yellow.   Your baby is not gaining weight by 40 days of age. SEEK IMMEDIATE MEDICAL CARE IF:   Your baby is overly tired (lethargic) and does not want to wake up and feed.  Your baby develops an unexplained fever. Document Released: 12/24/2004 Document Revised: 08/26/2012 Document Reviewed: 06/17/2012 [facility] Patient Information 2014 [place].

## 2013-01-25 NOTE — Progress Notes (Signed)
IOL scheduled on February 7th @ 0700.  [place] scheduled for January 23rd for 0730

## 2013-01-25 NOTE — Progress Notes (Signed)
   Subjective:    Dana Gonzalez is a [id number] [redacted]w[redacted]d being seen today for her first obstetrical visit.  Her obstetrical history is significant for lapse in care, history of stillborn at 82 weeks. Patient does intend to breast feed. Pregnancy history fully reviewed.  Patient reports no complaints.  Filed Vitals:   01/25/13 0949  BP: 121/79  Temp: 97 F (36.1 C)  Weight: 181 lb 11.2 oz (82.419 kg)    HISTORY: OB History  Gravida Para Term Preterm AB SAB TAB Ectopic Multiple Living  3 2 1 1  0 0 0 0 0 1    # Outcome Date GA Lbr Len/2nd Weight Sex Delivery Anes PTL Lv  3 CUR           2 TRM 05/18/08 [redacted]w[redacted]d  8 lb 6 oz (3.799 kg) F SVD EPI  Y  1 PRE 10/22/06 [redacted]w[redacted]d    SVD Local  SB     Comments: amniotic band around umbilical cord     Past Medical History  Diagnosis Date  . Thyroid disease   . DJSHFWYO(378.5)    Past Surgical History  Procedure Laterality Date  . Vaginal delivery      X 2  . Mouth surgery     Family History  Problem Relation Age of Onset  . Cancer Mother   . Hypertension Father   . Diabetes Maternal Grandmother   . Heart disease Paternal Grandmother      Exam    Uterus:     Pelvic Exam:    Perineum: Normal Perineum   Vulva: normal   Vagina:  normal mucosa, normal discharge   pH:    Cervix: 2/thick/long/post   Adnexa: not evaluated   Bony Pelvis: android  System: Breast:  normal appearance, no masses or tenderness   Skin: normal coloration and turgor, no rashes    Neurologic: oriented, no focal deficits   Extremities: normal strength, tone, and muscle mass   HEENT extra ocular movement intact   Mouth/Teeth mucous membranes moist, pharynx normal without lesions   Neck supple and no masses   Cardiovascular: regular rate and rhythm   Respiratory:  chest clear, no wheezing, crepitations, rhonchi, normal symmetric air entry   Abdomen: soft, gravid   Urinary:       Assessment:    Pregnancy: [id number] Patient Active Problem List   Diagnosis  Date Noted  . Insufficient prenatal care in third trimester 01/25/2013  . Supervision of high risk pregnancy in third trimester 01/25/2013  . History of stillbirth 01/25/2013        Plan:     Initial labs drawn. Prenatal vitamins. Problem list reviewed and updated. Genetic Screening discussed Quad Screen: results reviewed.  Ultrasound discussed; fetal survey: ordered. Will schedule induction at 39 weeks secondary to history of stillborn at 32 weeks Will start twice weekly NST Cultures today FM/labor precautions reviewed Patient undecided on birth control- information provided  Follow up in 1 weeks. 50% of 30 min visit spent on counseling and coordination of care.     Dana Gonzalez 01/25/2013  Addemdum: Patient left before NST. Will contact to reschedule

## 2013-01-25 NOTE — Progress Notes (Signed)
Pulse- 118 Reports vaginal pressure and occasional contractions

## 2013-01-26 ENCOUNTER — Ambulatory Visit (INDEPENDENT_AMBULATORY_CARE_PROVIDER_SITE_OTHER): Payer: Medicaid Other | Admitting: *Deleted

## 2013-01-26 VITALS — BP 124/71

## 2013-01-26 DIAGNOSIS — O09299 Supervision of pregnancy with other poor reproductive or obstetric history, unspecified trimester: Secondary | ICD-10-CM

## 2013-01-26 DIAGNOSIS — Z8759 Personal history of other complications of pregnancy, childbirth and the puerperium: Secondary | ICD-10-CM

## 2013-01-26 LAB — GC/CHLAMYDIA PROBE AMP
CT PROBE, AMP APTIMA: NEGATIVE
GC PROBE AMP APTIMA: NEGATIVE

## 2013-01-26 NOTE — Progress Notes (Signed)
NST reviewed and reactive.  Kei Mcelhiney L. Harraway-Smith, M.D., FACOG    

## 2013-01-26 NOTE — Progress Notes (Signed)
P-82 

## 2013-01-27 ENCOUNTER — Encounter: Payer: Self-pay | Admitting: Obstetrics and Gynecology

## 2013-01-27 DIAGNOSIS — O99820 Streptococcus B carrier state complicating pregnancy: Secondary | ICD-10-CM | POA: Insufficient documentation

## 2013-01-27 LAB — CULTURE, BETA STREP (GROUP B ONLY)

## 2013-01-28 ENCOUNTER — Telehealth (HOSPITAL_COMMUNITY): Payer: Self-pay | Admitting: *Deleted

## 2013-01-28 NOTE — Telephone Encounter (Signed)
Preadmission screen  

## 2013-01-29 ENCOUNTER — Ambulatory Visit (INDEPENDENT_AMBULATORY_CARE_PROVIDER_SITE_OTHER): Payer: Medicaid Other | Admitting: *Deleted

## 2013-01-29 ENCOUNTER — Ambulatory Visit (HOSPITAL_COMMUNITY): Payer: Medicaid Other

## 2013-01-29 ENCOUNTER — Ambulatory Visit (HOSPITAL_COMMUNITY)
Admission: RE | Admit: 2013-01-29 | Discharge: 2013-01-29 | Disposition: A | Discharge status: Home / Self Care | Payer: Medicaid Other | Source: Ambulatory Visit | Attending: Obstetrics and Gynecology | Admitting: Obstetrics and Gynecology

## 2013-01-29 VITALS — BP 111/69

## 2013-01-29 DIAGNOSIS — Z8759 Personal history of other complications of pregnancy, childbirth and the puerperium: Secondary | ICD-10-CM

## 2013-01-29 DIAGNOSIS — O09299 Supervision of pregnancy with other poor reproductive or obstetric history, unspecified trimester: Secondary | ICD-10-CM | POA: Insufficient documentation

## 2013-01-29 DIAGNOSIS — O0930 Supervision of pregnancy with insufficient antenatal care, unspecified trimester: Secondary | ICD-10-CM | POA: Insufficient documentation

## 2013-01-29 DIAGNOSIS — O0993 Supervision of high risk pregnancy, unspecified, third trimester: Secondary | ICD-10-CM

## 2013-01-29 DIAGNOSIS — Z3689 Encounter for other specified antenatal screening: Secondary | ICD-10-CM | POA: Insufficient documentation

## 2013-01-29 NOTE — Progress Notes (Signed)
NST 01/29/13 reactive

## 2013-01-29 NOTE — Progress Notes (Signed)
P = 97   [place] for growth done today

## 2013-02-01 ENCOUNTER — Encounter: Payer: Self-pay | Admitting: Obstetrics and Gynecology

## 2013-02-01 ENCOUNTER — Ambulatory Visit (INDEPENDENT_AMBULATORY_CARE_PROVIDER_SITE_OTHER): Payer: Medicaid Other | Admitting: Obstetrics & Gynecology

## 2013-02-01 VITALS — BP 126/83 | Temp 97.5°F | Wt 184.8 lb

## 2013-02-01 DIAGNOSIS — O09299 Supervision of pregnancy with other poor reproductive or obstetric history, unspecified trimester: Secondary | ICD-10-CM

## 2013-02-01 DIAGNOSIS — O99820 Streptococcus B carrier state complicating pregnancy: Secondary | ICD-10-CM

## 2013-02-01 DIAGNOSIS — O0933 Supervision of pregnancy with insufficient antenatal care, third trimester: Secondary | ICD-10-CM

## 2013-02-01 DIAGNOSIS — O09899 Supervision of other high risk pregnancies, unspecified trimester: Secondary | ICD-10-CM

## 2013-02-01 DIAGNOSIS — O0930 Supervision of pregnancy with insufficient antenatal care, unspecified trimester: Secondary | ICD-10-CM

## 2013-02-01 DIAGNOSIS — O3660X0 Maternal care for excessive fetal growth, unspecified trimester, not applicable or unspecified: Secondary | ICD-10-CM | POA: Insufficient documentation

## 2013-02-01 DIAGNOSIS — Z23 Encounter for immunization: Secondary | ICD-10-CM

## 2013-02-01 DIAGNOSIS — Z22330 Carrier of Group B streptococcus: Secondary | ICD-10-CM

## 2013-02-01 DIAGNOSIS — Z8759 Personal history of other complications of pregnancy, childbirth and the puerperium: Secondary | ICD-10-CM

## 2013-02-01 DIAGNOSIS — O0993 Supervision of high risk pregnancy, unspecified, third trimester: Secondary | ICD-10-CM

## 2013-02-01 LAB — POCT URINALYSIS DIP (DEVICE)
Bilirubin Urine: NEGATIVE
Glucose, UA: NEGATIVE mg/dL
KETONES UR: NEGATIVE mg/dL
NITRITE: NEGATIVE
PH: 7 (ref 5.0–8.0)
Protein, ur: NEGATIVE mg/dL
Specific Gravity, Urine: 1.015 (ref 1.005–1.030)
Urobilinogen, UA: 0.2 mg/dL (ref 0.0–1.0)

## 2013-02-01 MED ORDER — TETANUS-DIPHTH-ACELL PERTUSSIS 5-2.5-18.5 LF-MCG/0.5 IM SUSP: 0.5000 mL | Freq: Once | INTRAMUSCULAR | Status: DC

## 2013-02-01 NOTE — Progress Notes (Signed)
P= 103  Pt reports seeing mucous plug.  Has concerns about baby's weight on ultrasound this past Friday.

## 2013-02-01 NOTE — Patient Instructions (Signed)
Return to clinic for any obstetric concerns or go to MAU for evaluation  

## 2013-02-01 NOTE — Progress Notes (Signed)
01/29/13 EFW 3823g (8 lbs 7 oz)/>90%tile, AC >97%tile, AFI 45.62 cm, cephalic. Allayed fears about ultrasound; ultrasound EFW is not without error.  Will treat like any other patient in labor. NST performed today was reviewed and was found to be reactive.  Continue recommended antenatal testing and prenatal care. Counseled about Tdap vaccine, will get today No other complaints or concerns.  Fetal movement and labor precautions reviewed.

## 2013-02-05 ENCOUNTER — Ambulatory Visit (INDEPENDENT_AMBULATORY_CARE_PROVIDER_SITE_OTHER): Payer: Medicaid Other | Admitting: *Deleted

## 2013-02-05 VITALS — BP 137/71

## 2013-02-05 DIAGNOSIS — Z8759 Personal history of other complications of pregnancy, childbirth and the puerperium: Secondary | ICD-10-CM

## 2013-02-05 DIAGNOSIS — O09299 Supervision of pregnancy with other poor reproductive or obstetric history, unspecified trimester: Secondary | ICD-10-CM

## 2013-02-05 LAB — US OB FOLLOW UP

## 2013-02-05 NOTE — Progress Notes (Signed)
P=106 

## 2013-02-05 NOTE — Progress Notes (Signed)
NST Reactive on 02/05/13

## 2013-02-06 ENCOUNTER — Inpatient Hospital Stay (HOSPITAL_COMMUNITY)
Admission: AD | Admit: 2013-02-06 | Discharge: 2013-02-08 | DRG: 775 | Disposition: A | Discharge status: Home / Self Care | Payer: Medicaid Other | Source: Ambulatory Visit | Attending: Obstetrics and Gynecology | Admitting: Obstetrics and Gynecology

## 2013-02-06 ENCOUNTER — Encounter (HOSPITAL_COMMUNITY): Payer: Self-pay | Admitting: *Deleted

## 2013-02-06 DIAGNOSIS — O09299 Supervision of pregnancy with other poor reproductive or obstetric history, unspecified trimester: Secondary | ICD-10-CM

## 2013-02-06 DIAGNOSIS — Z87891 Personal history of nicotine dependence: Secondary | ICD-10-CM

## 2013-02-06 DIAGNOSIS — O0930 Supervision of pregnancy with insufficient antenatal care, unspecified trimester: Secondary | ICD-10-CM

## 2013-02-06 DIAGNOSIS — IMO0001 Reserved for inherently not codable concepts without codable children: Secondary | ICD-10-CM

## 2013-02-06 DIAGNOSIS — E079 Disorder of thyroid, unspecified: Secondary | ICD-10-CM | POA: Diagnosis present

## 2013-02-06 DIAGNOSIS — O99344 Other mental disorders complicating childbirth: Secondary | ICD-10-CM | POA: Diagnosis present

## 2013-02-06 DIAGNOSIS — Z22330 Carrier of Group B streptococcus: Secondary | ICD-10-CM

## 2013-02-06 DIAGNOSIS — O99892 Other specified diseases and conditions complicating childbirth: Secondary | ICD-10-CM | POA: Diagnosis present

## 2013-02-06 DIAGNOSIS — O99284 Endocrine, nutritional and metabolic diseases complicating childbirth: Secondary | ICD-10-CM

## 2013-02-06 DIAGNOSIS — F1210 Cannabis abuse, uncomplicated: Secondary | ICD-10-CM | POA: Diagnosis present

## 2013-02-06 DIAGNOSIS — E0789 Other specified disorders of thyroid: Secondary | ICD-10-CM | POA: Diagnosis present

## 2013-02-06 DIAGNOSIS — O9989 Other specified diseases and conditions complicating pregnancy, childbirth and the puerperium: Secondary | ICD-10-CM

## 2013-02-06 LAB — CBC
HCT: 36 % (ref 36.0–46.0)
HEMOGLOBIN: 12.9 g/dL (ref 12.0–15.0)
MCH: 33.5 pg (ref 26.0–34.0)
MCHC: 35.8 g/dL (ref 30.0–36.0)
MCV: 93.5 fL (ref 78.0–100.0)
Platelets: 186 10*3/uL (ref 150–400)
RBC: 3.85 MIL/uL — ABNORMAL LOW (ref 3.87–5.11)
RDW: 13.7 % (ref 11.5–15.5)
WBC: 11.4 10*3/uL — ABNORMAL HIGH (ref 4.0–10.5)

## 2013-02-06 MED ORDER — IBUPROFEN 600 MG PO TABS
600.0000 mg | ORAL_TABLET | Freq: Four times a day (QID) | ORAL | Status: DC | PRN
  Administered 2013-02-07: 600 mg via ORAL
  Filled 2013-02-06: qty 1

## 2013-02-06 MED ORDER — PHENYLEPHRINE 40 MCG/ML (10ML) SYRINGE FOR IV PUSH (FOR BLOOD PRESSURE SUPPORT)
80.0000 ug | PREFILLED_SYRINGE | INTRAVENOUS | Status: DC | PRN
  Filled 2013-02-06: qty 10
  Filled 2013-02-06: qty 2

## 2013-02-06 MED ORDER — LACTATED RINGERS IV SOLN: 500.0000 mL | INTRAVENOUS | Status: DC | PRN

## 2013-02-06 MED ORDER — FENTANYL 2.5 MCG/ML BUPIVACAINE 1/10 % EPIDURAL INFUSION (WH - ANES)
14.0000 mL/h | INTRAMUSCULAR | Status: DC | PRN
  Filled 2013-02-06: qty 125

## 2013-02-06 MED ORDER — DEXTROSE 5 % IV SOLN
2.5000 10*6.[IU] | INTRAVENOUS | Status: DC
  Administered 2013-02-07 (×2): 2.5 10*6.[IU] via INTRAVENOUS
  Filled 2013-02-06 (×5): qty 2.5

## 2013-02-06 MED ORDER — PENICILLIN G POTASSIUM 5000000 UNITS IJ SOLR
5.0000 10*6.[IU] | Freq: Once | INTRAMUSCULAR | Status: AC
  Administered 2013-02-06: 5 10*6.[IU] via INTRAVENOUS
  Filled 2013-02-06: qty 5

## 2013-02-06 MED ORDER — LIDOCAINE HCL (PF) 1 % IJ SOLN
30.0000 mL | INTRAMUSCULAR | Status: DC | PRN
  Administered 2013-02-07: 30 mL via SUBCUTANEOUS
  Filled 2013-02-06 (×2): qty 30

## 2013-02-06 MED ORDER — LACTATED RINGERS IV SOLN: 500.0000 mL | Freq: Once | INTRAVENOUS | Status: DC

## 2013-02-06 MED ORDER — CITRIC ACID-SODIUM CITRATE 334-500 MG/5ML PO SOLN
30.0000 mL | ORAL | Status: DC | PRN
  Administered 2013-02-06: 30 mL via ORAL
  Filled 2013-02-06: qty 15

## 2013-02-06 MED ORDER — PHENYLEPHRINE 40 MCG/ML (10ML) SYRINGE FOR IV PUSH (FOR BLOOD PRESSURE SUPPORT)
80.0000 ug | PREFILLED_SYRINGE | INTRAVENOUS | Status: DC | PRN
  Filled 2013-02-06: qty 2

## 2013-02-06 MED ORDER — EPHEDRINE 5 MG/ML INJ
10.0000 mg | INTRAVENOUS | Status: AC | PRN
  Administered 2013-02-07 (×2): 10 mg via INTRAVENOUS

## 2013-02-06 MED ORDER — DIPHENHYDRAMINE HCL 50 MG/ML IJ SOLN: 12.5000 mg | INTRAMUSCULAR | Status: DC | PRN

## 2013-02-06 MED ORDER — ONDANSETRON HCL 4 MG/2ML IJ SOLN
4.0000 mg | Freq: Four times a day (QID) | INTRAMUSCULAR | Status: DC | PRN
  Administered 2013-02-06: 4 mg via INTRAVENOUS
  Filled 2013-02-06: qty 2

## 2013-02-06 MED ORDER — ACETAMINOPHEN 325 MG PO TABS: 650.0000 mg | ORAL_TABLET | ORAL | Status: DC | PRN

## 2013-02-06 MED ORDER — OXYTOCIN 40 UNITS IN LACTATED RINGERS INFUSION - SIMPLE MED
62.5000 mL/h | INTRAVENOUS | Status: DC
  Filled 2013-02-06: qty 1000

## 2013-02-06 MED ORDER — CALCIUM CARBONATE ANTACID 500 MG PO CHEW
2.0000 | CHEWABLE_TABLET | Freq: Every evening | ORAL | Status: DC | PRN
  Administered 2013-02-07: 400 mg via ORAL
  Filled 2013-02-06: qty 1

## 2013-02-06 MED ORDER — OXYCODONE-ACETAMINOPHEN 5-325 MG PO TABS: 1.0000 | ORAL_TABLET | ORAL | Status: DC | PRN

## 2013-02-06 MED ORDER — EPHEDRINE 5 MG/ML INJ
10.0000 mg | INTRAVENOUS | Status: DC | PRN
  Administered 2013-02-07: 10 mg via INTRAVENOUS
  Filled 2013-02-06 (×2): qty 4
  Filled 2013-02-06: qty 2

## 2013-02-06 MED ORDER — OXYTOCIN BOLUS FROM INFUSION: 500.0000 mL | INTRAVENOUS | Status: DC

## 2013-02-06 MED ORDER — LACTATED RINGERS IV SOLN
INTRAVENOUS | Status: DC
Start: 2013-02-06 — End: 2013-02-07
  Administered 2013-02-06 – 2013-02-07 (×2): via INTRAVENOUS

## 2013-02-06 NOTE — MAU Note (Signed)
Contractions irregular all day. More regular for an hour. Denies leaking fld or bleeding. 4cm last ck

## 2013-02-06 NOTE — H&P (Signed)
Dana Gonzalez is a 23 y.o. female G3P1101 at [redacted]w[redacted]d presenting for painful contractions that started this morning but were irregular. For the past few hours they have been stronger and regular, about every 3 minutes. This pregnancy has been relatively uncomplicated but she has been getting biweekly NSTs due to a h/o 32w stillborn and her current pregnancy had an EFW of 3800g at 36 weeks. Maternal Medical History:  Reason for admission: Contractions.  Nausea.  Contractions: Onset was 13-24 hours ago.   Frequency: regular.   Perceived severity is moderate.    Fetal activity: Perceived fetal activity is normal.   Last perceived fetal movement was within the past hour.    Prenatal complications: Bleeding.   Prenatal Complications - Diabetes: none.    OB History   Grav Para Term Preterm Abortions TAB SAB Ect Mult Living   3 2 1 1  0 0 0 0 0 1     Past Medical History  Diagnosis Date  . Thyroid disease   . KKXFGHWE(993.7)    Past Surgical History  Procedure Laterality Date  . Vaginal delivery      X 2  . Mouth surgery     Family History: family history includes Cancer in her mother; Diabetes in her maternal grandmother; Heart disease in her paternal grandmother; Hypertension in her father. Social History:  reports that she has quit smoking. Her smoking use included Cigarettes. She smoked 0.00 packs per day. She has never used smokeless tobacco. She reports that she uses illicit drugs (Marijuana). She reports that she does not drink alcohol.   Prenatal Transfer Tool  Maternal Diabetes: No Genetic Screening: Normal Maternal Ultrasounds/Referrals: Abnormal:  Findings:   Other:LGA Fetal Ultrasounds or other Referrals:  None Maternal Substance Abuse:  No Significant Maternal Medications:  None Significant Maternal Lab Results:  Lab values include: Group B Strep positive Other Comments:  Late PNC  Review of Systems  Constitutional: Negative for fever.  Gastrointestinal: Positive  for heartburn and abdominal pain. Negative for nausea, vomiting and diarrhea.  All other systems reviewed and are negative.    Dilation: 5 Effacement (%): 60 Station: -2 Exam by:: K.WIslon<RN Blood pressure 132/74, pulse 114, temperature 98.2 F (36.8 C), resp. rate 20, height 5\' 7"  (1.702 m), weight 83.553 kg (184 lb 3.2 oz), SpO2 100.00%. Maternal Exam:  Uterine Assessment: Contraction strength is moderate.  Contraction duration is 60 seconds. Contraction frequency is regular.   Abdomen: Estimated fetal weight is 3800g on 36w u/s.   Fetal presentation: vertex  Introitus: Normal vulva. Normal vagina.  Vagina is negative for discharge.  Ferning test: not done.  Nitrazine test: not done.  Pelvis: adequate for delivery.   Cervix: Cervix evaluated by digital exam.     Fetal Exam Fetal Monitor Review: Mode: ultrasound.   Baseline rate: 150.  Variability: moderate (6-25 bpm).   Pattern: accelerations present and no decelerations.    Fetal State Assessment: Category I - tracings are normal.     Physical Exam  Nursing note and vitals reviewed. Constitutional: She is oriented to person, place, and time. She appears well-developed and well-nourished. No distress.  HENT:  Head: Normocephalic and atraumatic.  Eyes: Conjunctivae are normal. Right eye exhibits no discharge. Left eye exhibits no discharge.  Neck: Normal range of motion.  Cardiovascular: Normal rate.   Respiratory: Effort normal.  GI: Soft. She exhibits no distension. There is no tenderness.  Genitourinary: Vagina normal and uterus normal. No vaginal discharge found.  Musculoskeletal: Normal range of motion.  She exhibits no edema and no tenderness.  Neurological: She is alert and oriented to person, place, and time.  Skin: Skin is warm and dry. No rash noted. She is not diaphoretic.  Psychiatric: She has a normal mood and affect. Her behavior is normal.    Prenatal labs: ABO, Rh: --/--/A POS, A POS (01/15  1325) Antibody: NEG (01/15 1325) Rubella: Immune (07/01 0000) RPR: Nonreactive (07/01 0000)  HBsAg: Negative (07/01 0000)  HIV: Non-reactive (07/01 0000)  GBS: Positive (01/19 0000)   Assessment/Plan: Pt is a 23 y.o. G3P1101 at [redacted]w[redacted]d who presents with painful contractions and cervical dilation. LaborAdmitPlan #Labor: Expectant management, active labor at term #Pain: epidural on request #FWB: category 1 #ID: GBS positive - start penicillin G prophylaxis   Beverlyn Roux 02/06/2013, 8:31 PM

## 2013-02-07 ENCOUNTER — Encounter (HOSPITAL_COMMUNITY): Payer: Self-pay | Admitting: *Deleted

## 2013-02-07 ENCOUNTER — Inpatient Hospital Stay (HOSPITAL_COMMUNITY): Payer: Medicaid Other | Admitting: Anesthesiology

## 2013-02-07 ENCOUNTER — Encounter (HOSPITAL_COMMUNITY): Payer: Medicaid Other | Admitting: Anesthesiology

## 2013-02-07 DIAGNOSIS — E0789 Other specified disorders of thyroid: Secondary | ICD-10-CM

## 2013-02-07 DIAGNOSIS — O99344 Other mental disorders complicating childbirth: Secondary | ICD-10-CM

## 2013-02-07 DIAGNOSIS — F1210 Cannabis abuse, uncomplicated: Secondary | ICD-10-CM

## 2013-02-07 LAB — RPR: RPR: NONREACTIVE

## 2013-02-07 MED ORDER — TERBUTALINE SULFATE 1 MG/ML IJ SOLN: 0.2500 mg | Freq: Once | INTRAMUSCULAR | Status: DC

## 2013-02-07 MED ORDER — SIMETHICONE 80 MG PO CHEW: 80.0000 mg | CHEWABLE_TABLET | ORAL | Status: DC | PRN

## 2013-02-07 MED ORDER — DIPHENHYDRAMINE HCL 25 MG PO CAPS: 25.0000 mg | ORAL_CAPSULE | Freq: Four times a day (QID) | ORAL | Status: DC | PRN

## 2013-02-07 MED ORDER — TETANUS-DIPHTH-ACELL PERTUSSIS 5-2.5-18.5 LF-MCG/0.5 IM SUSP: 0.5000 mL | Freq: Once | INTRAMUSCULAR | Status: DC

## 2013-02-07 MED ORDER — WITCH HAZEL-GLYCERIN EX PADS: 1.0000 "application " | MEDICATED_PAD | CUTANEOUS | Status: DC | PRN

## 2013-02-07 MED ORDER — PRENATAL MULTIVITAMIN CH
1.0000 | ORAL_TABLET | Freq: Every day | ORAL | Status: DC
  Administered 2013-02-07 – 2013-02-08 (×2): 1 via ORAL
  Filled 2013-02-07 (×2): qty 1

## 2013-02-07 MED ORDER — OXYTOCIN 40 UNITS IN LACTATED RINGERS INFUSION - SIMPLE MED
1.0000 m[IU]/min | INTRAVENOUS | Status: DC
  Administered 2013-02-07: 2 m[IU]/min via INTRAVENOUS

## 2013-02-07 MED ORDER — LANOLIN HYDROUS EX OINT: TOPICAL_OINTMENT | CUTANEOUS | Status: DC | PRN

## 2013-02-07 MED ORDER — ONDANSETRON HCL 4 MG PO TABS: 4.0000 mg | ORAL_TABLET | ORAL | Status: DC | PRN

## 2013-02-07 MED ORDER — ONDANSETRON HCL 4 MG/2ML IJ SOLN: 4.0000 mg | INTRAMUSCULAR | Status: DC | PRN

## 2013-02-07 MED ORDER — BENZOCAINE-MENTHOL 20-0.5 % EX AERO: 1.0000 "application " | INHALATION_SPRAY | CUTANEOUS | Status: DC | PRN

## 2013-02-07 MED ORDER — ZOLPIDEM TARTRATE 5 MG PO TABS: 5.0000 mg | ORAL_TABLET | Freq: Every evening | ORAL | Status: DC | PRN

## 2013-02-07 MED ORDER — SENNOSIDES-DOCUSATE SODIUM 8.6-50 MG PO TABS
2.0000 | ORAL_TABLET | ORAL | Status: DC
  Administered 2013-02-08: 2 via ORAL
  Filled 2013-02-07: qty 2

## 2013-02-07 MED ORDER — DIBUCAINE 1 % RE OINT: 1.0000 "application " | TOPICAL_OINTMENT | RECTAL | Status: DC | PRN

## 2013-02-07 MED ORDER — TERBUTALINE SULFATE 1 MG/ML IJ SOLN: 0.2500 mg | Freq: Once | INTRAMUSCULAR | Status: DC | PRN

## 2013-02-07 MED ORDER — FENTANYL 2.5 MCG/ML BUPIVACAINE 1/10 % EPIDURAL INFUSION (WH - ANES)
INTRAMUSCULAR | Status: DC | PRN
  Administered 2013-02-07: 14 mL/h via EPIDURAL

## 2013-02-07 MED ORDER — LIDOCAINE HCL (PF) 1 % IJ SOLN
INTRAMUSCULAR | Status: DC | PRN
  Administered 2013-02-07 (×3): 5 mL

## 2013-02-07 MED ORDER — OXYCODONE-ACETAMINOPHEN 5-325 MG PO TABS
1.0000 | ORAL_TABLET | ORAL | Status: DC | PRN
  Filled 2013-02-07: qty 1

## 2013-02-07 MED ORDER — IBUPROFEN 600 MG PO TABS
600.0000 mg | ORAL_TABLET | Freq: Four times a day (QID) | ORAL | Status: DC
  Administered 2013-02-07 – 2013-02-08 (×4): 600 mg via ORAL
  Filled 2013-02-07 (×4): qty 1

## 2013-02-07 NOTE — Anesthesia Preprocedure Evaluation (Signed)
Anesthesia Evaluation  Patient identified by MRN, date of birth, ID band Patient awake    Reviewed: Allergy & Precautions, H&P , NPO status , Patient's Chart, lab work & pertinent test results  History of Anesthesia Complications Negative for: history of anesthetic complications  Airway Mallampati: II TM Distance: >3 FB Neck ROM: full    Dental no notable dental hx. (+) Teeth Intact   Pulmonary neg pulmonary ROS, former smoker,  breath sounds clear to auscultation  Pulmonary exam normal       Cardiovascular negative cardio ROS  Rhythm:regular Rate:Normal     Neuro/Psych negative neurological ROS  negative psych ROS   GI/Hepatic negative GI ROS, Neg liver ROS,   Endo/Other  negative endocrine ROS  Renal/GU negative Renal ROS  negative genitourinary   Musculoskeletal   Abdominal Normal abdominal exam  (+)   Peds  Hematology negative hematology ROS (+)   Anesthesia Other Findings   Reproductive/Obstetrics (+) Pregnancy                           Anesthesia Physical Anesthesia Plan  ASA: II  Anesthesia Plan: Epidural   Post-op Pain Management:    Induction:   Airway Management Planned:   Additional Equipment:   Intra-op Plan:   Post-operative Plan:   Informed Consent: I have reviewed the patients History and Physical, chart, labs and discussed the procedure including the risks, benefits and alternatives for the proposed anesthesia with the patient or authorized representative who has indicated his/her understanding and acceptance.     Plan Discussed with:   Anesthesia Plan Comments:         Anesthesia Quick Evaluation  

## 2013-02-07 NOTE — H&P (Signed)
Attestation of Attending Supervision of Advanced Practitioner (CNM/NP): Evaluation and management procedures were performed by the Advanced Practitioner under my supervision and collaboration.  I have reviewed the Advanced Practitioner's note and chart, and I agree with the management and plan.  Mychelle Kendra 02/07/2013 3:16 AM

## 2013-02-07 NOTE — Lactation Note (Signed)
This note was copied from the chart of Dana Johari Bennetts. Lactation Consultation Note  Patient Name: Dana Gonzalez LXBWI'O Date: 02/07/2013 Reason for consult: Initial assessment Baby 1 hours old. Mom states that this is the first child she has attempted to breastfeed. Assisted mom to latch baby in football position. Reviewed basics of breastfeeding. Assisted mom to hand express drops of colostrum. Baby latched well and sucked rhythmically. Demonstrated to mom how to coax lower lip out. Mom reports no pain. Mom continued to nurse after [facility] left the room. Mom given [facility] brochure and aware of OP/BFSG services. Enc mom to call out for assistance as needed. Enc STS and feeding with cues.   Maternal Data Formula Feeding for Exclusion: No Has patient been taught Hand Expression?: Yes Does the patient have breastfeeding experience prior to this delivery?: No  Feeding Feeding Type: Breast Fed Length of feed: 0 min (attempted-too sleepy)  LATCH Score/Interventions Latch: Grasps breast easily, tongue down, lips flanged, rhythmical sucking. Intervention(s): Adjust position;Assist with latch;Breast compression  Audible Swallowing: A few with stimulation Intervention(s): Hand expression  Type of Nipple: Everted at rest and after stimulation  Comfort (Breast/Nipple): Soft / non-tender     Hold (Positioning): Assistance needed to correctly position infant at breast and maintain latch. Intervention(s): Breastfeeding basics reviewed;Support Pillows;Position options;Skin to skin  LATCH Score: 8  Lactation Tools Discussed/Used     Consult Status Consult Status: Follow-up Date: 02/08/13 Follow-up type: In-patient    Inocente Salles 02/07/2013, 9:36 PM

## 2013-02-07 NOTE — Anesthesia Procedure Notes (Signed)
Epidural Patient location during procedure: OB  Staffing Anesthesiologist: Montez Hageman Performed by: anesthesiologist   Preanesthetic Checklist Completed: patient identified, site marked, surgical consent, pre-op evaluation, timeout performed, IV checked, risks and benefits discussed and monitors and equipment checked  Epidural Patient position: sitting Prep: ChloraPrep Patient monitoring: heart rate, continuous pulse ox and blood pressure Approach: right paramedian Injection technique: LOR saline  Needle:  Needle type: Tuohy  Needle gauge: 17 G Needle length: 9 cm and 9 Needle insertion depth: 6 cm Catheter type: closed end flexible Catheter size: 20 Guage Catheter at skin depth: 10 cm Test dose: negative  Assessment Events: blood not aspirated, injection not painful, no injection resistance, negative IV test and no paresthesia  Additional Notes   Patient tolerated the insertion well without complications.

## 2013-02-07 NOTE — H&P (Addendum)
I spoke with and examined patient and agree with resident's note and plan of care.  Initiated pnc w/ [facility] [employer], last documented visit there at 27.3wks, then transferred to [facility] at 36.2wks. EFW 3823g/8lb7oz/>90% w/ AC >97% at 36.6wks, normal 1hr glucola of [address] CNM, [facility] 02/07/2013 2:30 AM

## 2013-02-07 NOTE — Anesthesia Postprocedure Evaluation (Signed)
  Anesthesia Post-op Note  Patient: Dana Gonzalez  Procedure(s) Performed: * No procedures listed *  Patient Location: Mother/Baby  Anesthesia Type:Epidural  Level of Consciousness: awake and alert   Airway and Oxygen Therapy: Patient Spontanous Breathing  Post-op Pain: mild  Post-op Assessment: Patient's Cardiovascular Status Stable, Respiratory Function Stable, No signs of Nausea or vomiting, Pain level controlled, No headache, No residual numbness and No residual motor weakness  Post-op Vital Signs: stable  Complications: No apparent anesthesia complications

## 2013-02-07 NOTE — Progress Notes (Signed)
Patient ID: Lorean Ekstrand, female   DOB: July 03, 1990, 23 y.o.   MRN: [id number] Camaryn Lumbert is a 23 y.o. G3P1101 at [redacted]w[redacted]d  admitted for active labor  Subjective: Breathing well w/ uc's, does want an epidural  Objective: BP 129/74  Pulse 114  Temp(Src) 98.2 F (36.8 C) (Oral)  Resp 18  Ht 5\' 7"  (1.702 m)  Wt 83.553 kg (184 lb 3.2 oz)  BMI 28.84 kg/m2  SpO2 100%    FHT:  FHR: 150 bpm, variability: moderate,  accelerations:  Present,  decelerations:  Absent UC:   q 2-6  SVE:   Dilation: 6 Effacement (%): 80 Station: -2 (Bulging bag) Exam by:: Knute Neu, CNM  Labs: Lab Results  Component Value Date   WBC 11.4* 02/06/2013   HGB 12.9 02/06/2013   HCT 36.0 02/06/2013   MCV 93.5 02/06/2013   PLT 186 02/06/2013    Assessment / Plan: SOL, more effaced since last exam, now w/ bbow. Pt wants to get epidural then will consider arom vs. pit augment  Labor: progressing slowly, just entering active labor Fetal Wellbeing:  Category I Pain Control:  Labor support without medications and requesting epidural Pre-eclampsia: n/a I/D:  pcn for gbs pos Anticipated MOD:  NSVD  Tawnya Crook CNM, WHNP-BC 02/07/2013, 2:25 AM

## 2013-02-07 NOTE — Progress Notes (Signed)
Dana Gonzalez is a 23 y.o. G3P1101 at [redacted]w[redacted]d admitted for active labor  Subjective:   Objective: BP 107/51  Pulse 103  Temp(Src) 98.2 F (36.8 C) (Oral)  Resp 18  Ht 5\' 7"  (1.702 m)  Wt 83.553 kg (184 lb 3.2 oz)  BMI 28.84 kg/m2  SpO2 100%   Total I/O In: -  Out: 400 [Urine:400]  FHT:  FHR: 150 bpm, variability: moderate,  accelerations:  Present,  decelerations:  Absent UC:   irregular, every 2-5 minutes SVE:   Dilation: 7 Effacement (%): 80 Station: -2 Exam by:: OINOMV, RN  Labs: Lab Results  Component Value Date   WBC 11.4* 02/06/2013   HGB 12.9 02/06/2013   HCT 36.0 02/06/2013   MCV 93.5 02/06/2013   PLT 186 02/06/2013    Assessment / Plan: Augmentation of labor, progressing well on pitocin  Labor: No change over past 2 hours, AROM at 7:15 with clear fluid Preeclampsia:  no signs or symptoms of toxicity Fetal Wellbeing:  Category I Pain Control:  Epidural I/D:  n/a Anticipated MOD:  NSVD  Beverlyn Roux 02/07/2013, 7:21 AM

## 2013-02-07 NOTE — Progress Notes (Signed)
Clinical Social Work Department PSYCHOSOCIAL ASSESSMENT - MATERNAL/CHILD 02/07/2013  Patient:  Dana Gonzalez, Dana Gonzalez  Account Number:  [id number]  Admit Date:  02/06/2013  Ardine Eng Name:   Venia Carbon    Clinical Social Worker:  Mena Simonis, LCSW   Date/Time:  02/07/2013 02:30 PM  Date Referred:  02/07/2013      Referred reason  [facility]  Substance Abuse   Other referral source:    I:  FAMILY / HOME ENVIRONMENT Child's legal guardian:  PARENT  Guardian - Name Guardian - Age Guardian - Address  [address], [place] [address]   Other household support members/support persons Other support:    II  PSYCHOSOCIAL DATA Information Source:    Occupational hygienist Employment:   FOB is employed   Museum/gallery curator resources:  [employer] [place] / Grade:   Maternity Care Coordinator / Child Services Coordination / Early Interventions:  Cultural issues impacting care:    III  STRENGTHS Strengths  Supportive family/friends  Home prepared for Child (including basic supplies)  Adequate Resources   Strength comment:    IV  RISK FACTORS AND CURRENT PROBLEMS Current Problem:       V  SOCIAL WORK ASSESSMENT Acknowledged order for Social Work consult to assess mother's history of marijuana and late [facility].  Mother was receptive to social work intervention.   She is a single parent with one other dependent age 25.  Parents cohabitate. FOB is reportedly very supportive.  Mother states that she moved to [facility] from [facility] Nov. 2014 and did have [facility] while living in [place].  She denies any hx of mental illness.  Mother states that she tried marijuana once when she was age 23.   She denies hx of any other illicit drug.  UDS on newborn was negative.  Mother was informed of the hospital's drug screening policy.  No acute social concerns related at this time.   Mother informed of social work Fish farm manager.       VI SOCIAL WORK PLAN Social Work Plan  No Further Intervention Required / No Barriers to Discharge   Type of pt/family education:   If child protective services report - county:   If child protective services report - date:   Information/referral to community resources comment:   Other social work plan:   Will continue to monitor UDS.

## 2013-02-08 ENCOUNTER — Other Ambulatory Visit: Payer: Medicaid Other

## 2013-02-08 NOTE — Progress Notes (Signed)
UR chart review completed.  

## 2013-02-08 NOTE — Discharge Instructions (Signed)

## 2013-02-08 NOTE — Discharge Summary (Signed)
Obstetric Discharge Summary Reason for Admission: onset of labor Prenatal Procedures: none Intrapartum Procedures: spontaneous vaginal delivery Postpartum Procedures: none Complications-Operative and Postpartum: 2nd degree perineal laceration Hemoglobin  Date Value Range Status  02/06/2013 12.9  12.0 - 15.0 g/dL Final  11/24/2012 12.6   Final     HCT  Date Value Range Status  02/06/2013 36.0  36.0 - 46.0 % Final  11/24/2012 38   Final    Physical Exam:  General: alert, cooperative, appears stated age and no distress Lochia: appropriate Uterine Fundus: firm Incision: n/a DVT Evaluation: No evidence of DVT seen on physical exam. No cords or calf tenderness. No significant calf/ankle edema.  Discharge Diagnoses: Term Pregnancy-delivered  Discharge Information: Date: 02/08/2013 Activity: pelvic rest Diet: routine Medications: PNV and Ibuprofen Condition: stable Instructions: refer to practice specific booklet Discharge to: home Follow-up Information   Follow up with [facility]. Schedule an appointment as soon as possible for a visit in 4 weeks. (Call as soon as possible and make appt for 2-4 weeks from your delivery date)    Specialty:  Obstetrics and Gynecology   Contact information:   [place] [address] [address] Arrived in active labor and was augmented with pitocin.  GBS positive warranted pcn. Baby girl Delivered NSVD. 2nd degree laceration sutured in the usual fashion. Low BP initially postpartum now resolved. SW consult for late prenatal care and history of substance abuse. Baby with negative UDS. No further interventions by SW at this time.   MOC: Desires Nexplanon or IUD MOF: Breast  Newborn Data: Live born female  Birth Weight: 8 lb 15.6 oz (4071 g) APGAR: 9, 9  Home with mother.  Wilnette Kales 02/08/2013, 6:57 AM  I have seen and examined this patient and agree with above documentation in  the resident's note.   Ebbie Latus, M.D. [facility] Fellow 02/08/2013 8:06 AM

## 2013-02-08 NOTE — Lactation Note (Signed)
This note was copied from the chart of Dana Layli Capshaw. Lactation Consultation Note  Patient Name: Dana Gonzalez [id number] Date: 02/08/2013 Reason for consult: Follow-up assessment Per mom baby has fed in the last hour .  Presently baby sleepy, woke up for a short interval  Attempted at the breast , baby sleepy, Lc encouraged skin to skin. Discussed milk coming in and prevention and tx of sore and engorged breast./ Instructed on use hand pump and shells . Encouraged mom to call for a latch check with feeding cues.   Maternal Data    Feeding Feeding Type: Breast Fed Length of feed: 15 min  LATCH Score/Interventions Latch: Too sleepy or reluctant, no latch achieved, no sucking elicited. Intervention(s): Skin to skin;Teach feeding cues;Waking techniques Intervention(s): Adjust position;Assist with latch;Breast massage;Breast compression  Audible Swallowing: None  Type of Nipple: Everted at rest and after stimulation (semi compress able areolas , shells provided )  Comfort (Breast/Nipple): Soft / non-tender     Hold (Positioning): Assistance needed to correctly position infant at breast and maintain latch. Intervention(s): Breastfeeding basics reviewed;Support Pillows;Position options;Skin to skin  LATCH Score: 5  Lactation Tools Discussed/Used Tools: Pump;Shells Shell Type: Inverted Breast pump type: Manual Pump Review: Setup, frequency, and cleaning Initiated by:: MAI  Date initiated:: 02/08/13   Consult Status Consult Status: Follow-up (enc to call for a latch check ) Date: 02/08/13 Follow-up type: In-patient    Myer Haff 02/08/2013, 11:31 AM

## 2013-02-09 ENCOUNTER — Encounter: Payer: Self-pay | Admitting: Nurse Practitioner

## 2013-02-10 ENCOUNTER — Ambulatory Visit (HOSPITAL_COMMUNITY)
Admission: RE | Admit: 2013-02-10 | Discharge: 2013-02-10 | Disposition: A | Discharge status: Home / Self Care | Payer: Medicaid Other | Source: Ambulatory Visit | Attending: Obstetrics and Gynecology | Admitting: Obstetrics and Gynecology

## 2013-02-10 NOTE — Lactation Note (Signed)
Adult Lactation Consultation Outpatient Visit Note  Patient Name: Dana Gonzalez                                 RJJO Venia Carbon, DOB [birth year], now 13 days old Date of Birth: 1990-07-14                                            Birth Weight  8 lb. 15.6 oz Gestational Age at Delivery: [redacted]w[redacted]d Type of Delivery: SVB  Breastfeeding History: Frequency of Breastfeeding: Attempting to BF every 2-3 hour but baby will not sustain a latch Length of Feeding: less than 5 minutes Voids: 4-5/day Stools: 5-6/day, liquid yellow  Supplementing / Method: Pumping:  Type of Pump:  Harmony Hand Pump   Frequency:  occasionally  Volume:  None  Comments: Mom is here for feeding assessment, baby is not sustaining a latch, her nipples are cracked, sore. Baby was at [facility] yesterday and weight loss had increased. Mom started to supplement yesterday with Dory Horn formula 30 ml every 3 hours. Mom reports her breast began to fill starting last night. Mom also reports unsuccessful breastfeeding with her 25 year old. The 25 year old could not sustain a latch either so she switched to formula/bottle feeding. Mom would like to be successful with this baby at the breast. Mom has history of hyperthyroid she reports a few years ago, but her labs were normal with this pregnancy. She has not been under treatment. Mom does not have [facility] yet, she reports recently moving to this area and did not get her [facility] transferred.    Consultation Evaluation: Dana Gonzalez is jaundiced today at this visit, very sleepy. Mom's right nipple has scab, the left nipple is red with some bruising. Mom's nipples are erect, but with short nipple shaft. Mom's breast are becoming engorged. Had Mom hand express small amount of colostrum prior to attempting to latch Dana Gonzalez.   Initial Feeding Assessment: Pre-feed Weight:   8 lb. 4.9 oz/3768 gm Post-feed Weight:   8 lb. 5.6 oz/3786 gm Amount Transferred:  18 ml from right breast in football hold. Comments: Could  not get Dana Gonzalez to sustain a latch so initiated #16 nipple shield. After several attempts, she latched and with lots of stimulation sustained a suckling pattern. Mom's nipple started to bleed where the scab broke open, changed the nipple shield to #20 and Mom reported this felt better. Lots of stimulation and waking techniques to keep Dana Gonzalez actively nursing, but she nursed for 12 minutes, transferring 18 ml. Mom's breast softening with baby nursing. Some intermittent dimpling noted with baby at the breast. Demonstrated to Mom how to bring bottom lip down and obtain depth with latch.   Additional Feeding Assessment: Pre-feed Weight:   8 lb. 5.6 oz/3768 gm Post-feed Weight:   8 lb. 5.8 oz/3792 gm Amount Transferred:  6 ml from left breast with nursing for 11 minutes in football hold. Comments:  Used #16 nipple shield on left breast as this fits better and Mom tolerated this well. Again baby very sleepy at the breast, lots of stimulation and awakening needed to keep her nursing. After weighing Dana Gonzalez, re-latched her to the left breast in cross cradle hold and she demonstrated a better suckling pattern and was more awake at the breast.  Additional Feeding Assessment: Pre-feed Weight:  8 lb. 5.8 oz/3792 gm Post-feed Weight:   8 lb. 6.0 oz/3798 gm Amount Transferred:  6 ml  Comments:  See above note. After this feeding had Mom post pump right breast using Harmony Hand Pump. She received 35 ml of EBM and gave this back to baby Dana Gonzalez via bottle with slow flow nipple.   Total Breast milk Transferred this Visit: 30 ml. At breast, 35 ml of EBM via bottle for total of 65 ml this feeding.  Total Supplement Given:   Additional Interventions: Engorgement care reviewed with Mom and written instructions give. Care for sore nipples reviewed with Mom, Comfort gels given with instructions.  Changed flange size to 27 for hand pump. Mom to call [facility] and get registered again. LC to send [facility] referral for  pump. Discussed feeding plan options as Mom does not have much help at home and also has elderly grandmother to care for and 69 year old. Plan A - Breastfeed every 2-3 hours keep baby nursing for 15-20 minutes each breast. Use nipple shield as instructed to help with latch. Post pump to comfort as part of engorgement treatment and give baby back any amount of EBM received. Follow engorgement plan. Plan B - Breastfeed every 2-3 hours, 1 breast for up to 30 minutes, pump the other breast for 15 minutes and give the baby any EBM received. Alternate each feeding the breast she pumps. Engorgement plan includes BF every 2-3 hours, not missing any feedings, pre-pump as needed to help with latch, keep baby actively nursing for 15-20 minutes, post pump as needed to comfort, ice packs prn.  Monitor voids/stools, refer to page 24 of Baby N Me booklet.  Follow-Up Peds today at 3:00 pm Support group Monday, 7:00 pm or Tuesday, 11:00 am.     Katrine Coho 02/10/2013, 2:47 PM

## 2013-02-11 ENCOUNTER — Other Ambulatory Visit: Payer: Medicaid Other

## 2013-02-13 ENCOUNTER — Inpatient Hospital Stay (HOSPITAL_COMMUNITY): Admission: RE | Admit: 2013-02-13 | Payer: Medicaid Other | Source: Ambulatory Visit

## 2013-03-08 ENCOUNTER — Ambulatory Visit: Payer: Medicaid Other | Admitting: Nurse Practitioner

## 2013-03-22 ENCOUNTER — Ambulatory Visit (INDEPENDENT_AMBULATORY_CARE_PROVIDER_SITE_OTHER): Payer: Medicaid Other | Admitting: Medical

## 2013-03-22 ENCOUNTER — Encounter: Payer: Self-pay | Admitting: Medical

## 2013-03-22 DIAGNOSIS — IMO0002 Reserved for concepts with insufficient information to code with codable children: Secondary | ICD-10-CM | POA: Insufficient documentation

## 2013-03-22 NOTE — Progress Notes (Signed)
Patient ID: Dana Gonzalez, female   DOB: 1990/02/25, 22 y.o.   MRN: [id number] Subjective:     Dana Gonzalez is a 23 y.o. female who presents for a postpartum visit. She is 6 weeks postpartum following a spontaneous vaginal delivery. I have fully reviewed the prenatal and intrapartum course. The delivery was at 38.1 gestational weeks. Outcome: spontaneous vaginal delivery. Anesthesia: epidural. Postpartum course has been normal. Baby's course has been normal. Baby is feeding by bottle - Gerber Gentle. Bleeding no bleeding. Bowel function is normal. Bladder function is normal. Patient is sexually active. Contraception method is none. Postpartum depression screening: negative.  The following portions of the patient's history were reviewed and updated as appropriate: allergies, current medications, past family history, past medical history, past social history, past surgical history and problem list.  Review of Systems Pertinent items are noted in HPI.   Objective:    BP 123/73  Pulse 81  Temp(Src) 98 F (36.7 C) (Oral)  Ht 5\' 6"  (1.676 m)  Wt 174 lb 4.8 oz (79.062 kg)  BMI 28.15 kg/m2  Breastfeeding? No  General:  alert and cooperative   Breasts:  not performed  Lungs: clear to auscultation bilaterally  Heart:  regular rate and rhythm, S1, S2 normal, no murmur, click, rub or gallop  Abdomen: soft, non-tender; bowel sounds normal; no masses,  no organomegaly   Vulva:  not evaluated  Vagina: not evaluated  Cervix:  not evaluated  Corpus: not examined  Adnexa:  not evaluated  Rectal Exam: Not performed.        Assessment:     Normal postpartum exam. Pap smear not done at today's visit.  last pap performed 07/07/12 was negative  Plan:    1. Contraception: none 2. Discussed birth control options. Patient will decide between Depo provera and Mirena IUD a 3. Follow up in: for birth control initiation . patient to call [facility] clinic for an appointment or as needed.

## 2013-03-22 NOTE — Progress Notes (Signed)
Patient might be interested in depo shot for birth control; does report unprotected sex yesterday

## 2013-03-22 NOTE — Patient Instructions (Signed)

## 2013-09-09 ENCOUNTER — Ambulatory Visit (INDEPENDENT_AMBULATORY_CARE_PROVIDER_SITE_OTHER): Payer: Medicaid Other

## 2013-09-09 DIAGNOSIS — R300 Dysuria: Secondary | ICD-10-CM

## 2013-09-09 LAB — POCT URINALYSIS DIP (DEVICE)
BILIRUBIN URINE: NEGATIVE
GLUCOSE, UA: 100 mg/dL — AB
Hgb urine dipstick: NEGATIVE
Ketones, ur: NEGATIVE mg/dL
NITRITE: POSITIVE — AB
Protein, ur: NEGATIVE mg/dL
Specific Gravity, Urine: 1.015 (ref 1.005–1.030)
UROBILINOGEN UA: 1 mg/dL (ref 0.0–1.0)
pH: 8.5 — ABNORMAL HIGH (ref 5.0–8.0)

## 2013-09-09 NOTE — Progress Notes (Signed)
Patient here today for urinalysis for possible UTI r/t burning with urination. Urinalysis obtained and nitrates and leuks present. Sent for culture. Informed patient we will call her with RX for ABX once we have identified the bacteria causing her UTI and appropriate ABX to treat. Patient verbalized understanding and stated we could leave a detailed message on voicemail as it is a private phone (patient works at night and sleeps during the day).

## 2013-09-10 ENCOUNTER — Other Ambulatory Visit: Payer: Self-pay | Admitting: Obstetrics & Gynecology

## 2013-09-10 MED ORDER — SULFAMETHOXAZOLE-TRIMETHOPRIM 800-160 MG PO TABS: 1.0000 | ORAL_TABLET | Freq: Two times a day (BID) | ORAL | Status: DC

## 2013-09-10 NOTE — Progress Notes (Unsigned)
Pt still having urinary symptoms.  + nitrites on UA on 09/09/13.  Will start Bactirm bid for 5 days

## 2013-09-12 LAB — URINE CULTURE

## 2013-11-08 ENCOUNTER — Encounter: Payer: Self-pay | Admitting: Medical

## 2013-11-30 ENCOUNTER — Encounter (HOSPITAL_COMMUNITY): Payer: Self-pay | Admitting: *Deleted

## 2013-11-30 ENCOUNTER — Inpatient Hospital Stay (HOSPITAL_COMMUNITY)
Admission: RE | Admit: 2013-11-30 | Discharge: 2013-11-30 | Disposition: A | Discharge status: Home / Self Care | Payer: Medicaid Other | Source: Intra-hospital | Attending: Obstetrics and Gynecology | Admitting: Obstetrics and Gynecology

## 2013-11-30 ENCOUNTER — Inpatient Hospital Stay (HOSPITAL_COMMUNITY): Payer: Medicaid Other

## 2013-11-30 DIAGNOSIS — R109 Unspecified abdominal pain: Secondary | ICD-10-CM

## 2013-11-30 DIAGNOSIS — O26899 Other specified pregnancy related conditions, unspecified trimester: Secondary | ICD-10-CM

## 2013-11-30 DIAGNOSIS — Z3201 Encounter for pregnancy test, result positive: Secondary | ICD-10-CM | POA: Diagnosis not present

## 2013-11-30 DIAGNOSIS — O9989 Other specified diseases and conditions complicating pregnancy, childbirth and the puerperium: Secondary | ICD-10-CM

## 2013-11-30 DIAGNOSIS — N898 Other specified noninflammatory disorders of vagina: Secondary | ICD-10-CM | POA: Insufficient documentation

## 2013-11-30 DIAGNOSIS — Z3A00 Weeks of gestation of pregnancy not specified: Secondary | ICD-10-CM | POA: Diagnosis not present

## 2013-11-30 DIAGNOSIS — O26891 Other specified pregnancy related conditions, first trimester: Secondary | ICD-10-CM | POA: Diagnosis not present

## 2013-11-30 DIAGNOSIS — Z87891 Personal history of nicotine dependence: Secondary | ICD-10-CM | POA: Insufficient documentation

## 2013-11-30 HISTORY — DX: Unspecified infectious disease: B99.9

## 2013-11-30 LAB — URINALYSIS, ROUTINE W REFLEX MICROSCOPIC
BILIRUBIN URINE: NEGATIVE
Glucose, UA: NEGATIVE mg/dL
Hgb urine dipstick: NEGATIVE
KETONES UR: NEGATIVE mg/dL
Leukocytes, UA: NEGATIVE
NITRITE: NEGATIVE
Protein, ur: NEGATIVE mg/dL
Specific Gravity, Urine: 1.02 (ref 1.005–1.030)
UROBILINOGEN UA: 0.2 mg/dL (ref 0.0–1.0)
pH: 6 (ref 5.0–8.0)

## 2013-11-30 LAB — CBC
HEMATOCRIT: 41.1 % (ref 36.0–46.0)
Hemoglobin: 14.3 g/dL (ref 12.0–15.0)
MCH: 32.8 pg (ref 26.0–34.0)
MCHC: 34.8 g/dL (ref 30.0–36.0)
MCV: 94.3 fL (ref 78.0–100.0)
Platelets: 227 10*3/uL (ref 150–400)
RBC: 4.36 MIL/uL (ref 3.87–5.11)
RDW: 12.5 % (ref 11.5–15.5)
WBC: 12 10*3/uL — AB (ref 4.0–10.5)

## 2013-11-30 LAB — WET PREP, GENITAL
CLUE CELLS WET PREP: NONE SEEN
TRICH WET PREP: NONE SEEN
Yeast Wet Prep HPF POC: NONE SEEN

## 2013-11-30 LAB — POCT PREGNANCY, URINE: Preg Test, Ur: POSITIVE — AB

## 2013-11-30 LAB — HCG, QUANTITATIVE, PREGNANCY: hCG, Beta Chain, Quant, S: 107530 m[IU]/mL — ABNORMAL HIGH (ref <5)

## 2013-11-30 LAB — HIV ANTIBODY (ROUTINE TESTING W REFLEX): HIV 1&2 Ab, 4th Generation: NONREACTIVE

## 2013-11-30 NOTE — Discharge Instructions (Signed)

## 2013-11-30 NOTE — MAU Provider Note (Signed)
History     CSN: [id number]  Arrival date and time: 11/30/13 1221   First Provider Initiated Contact with Patient 11/30/13 1313      Chief Complaint  Patient presents with  . Possible Pregnancy  . Abdominal Cramping  . Vaginal Discharge   Possible Pregnancy Associated symptoms include abdominal pain, nausea and vomiting. Pertinent negatives include no chest pain, chills, fever, headaches or rash.  Abdominal Cramping Associated symptoms include nausea and vomiting. Pertinent negatives include no dysuria, fever, headaches or hematuria.  Vaginal Discharge The patient's primary symptoms include vaginal discharge. Associated symptoms include abdominal pain, nausea and vomiting. Pertinent negatives include no chills, dysuria, fever, headaches, hematuria or rash.   Ms. Dana Gonzalez is a 23 year old [id number] presents to the ED with complaints of abdominal cramping, vaginal discharge, and possible pregnancy. Patient states her abdominal cramping started one week ago and worsened last night. She states the cramping feels like "period cramping" but is located in her RLQ region. She states that moving around makes the pain worse. She notes that she took a pregnancy test last week and it came back "sort of positive." She is unsure of when her last menstrual period was due to irregular periods since her last newborn 9 months ago. The last time she can recall any sort of vaginal bleeding was in September.  The patient also notes she has increased vaginal discharge that is thin.     Past Medical History  Diagnosis Date  . Thyroid disease   . Headache(784.0)   . Infection     UTI    Past Surgical History  Procedure Laterality Date  . Vaginal delivery      X 2  . Mouth surgery      Family History  Problem Relation Age of Onset  . Cancer Mother     breast to bone  . Hypertension Father   . Diabetes Maternal Grandmother   . Stroke Maternal Grandmother   . Heart disease Paternal Grandmother      History  Substance Use Topics  . Smoking status: Former Smoker    Types: Cigarettes  . Smokeless tobacco: Never Used     Comment: Aug 2015  . Alcohol Use: No    Allergies: No Known Allergies  Prescriptions prior to admission  Medication Sig Dispense Refill Last Dose  . Multiple Vitamin (MULTIVITAMIN WITH MINERALS) TABS tablet Take 1 tablet by mouth daily.   11/29/2013 at Unknown time  . sulfamethoxazole-trimethoprim (BACTRIM DS) 800-160 MG per tablet Take 1 tablet by mouth 2 (two) times daily. (Patient not taking: Reported on 11/30/2013) 10 tablet 0     Review of Systems  Constitutional: Negative for fever and chills.  Respiratory: Negative for shortness of breath.   Cardiovascular: Negative for chest pain.  Gastrointestinal: Positive for nausea, vomiting and abdominal pain.  Genitourinary: Positive for vaginal discharge. Negative for dysuria and hematuria.       Vaginal discharge   Skin: Negative for rash.  Neurological: Negative for headaches.   Physical Exam   Blood pressure 125/67, pulse 102, temperature 98.2 F (36.8 C), temperature source Oral, resp. rate 16, height 5' 6.5" (1.689 m), weight 77.293 kg (170 lb 6.4 oz), SpO2 100 %, not currently breastfeeding.  Physical Exam  Constitutional: She is oriented to person, place, and time. She appears well-developed and well-nourished.  HENT:  Head: Normocephalic.  Neck: Normal range of motion.  Cardiovascular: Normal rate, regular rhythm and normal heart sounds.   Respiratory: Effort normal and  breath sounds normal. No respiratory distress.  GI: Soft. Bowel sounds are normal. There is tenderness.  RLQ  Genitourinary: There is rash on the right labia. No tenderness in the vagina. No signs of injury around the vagina. Vaginal discharge found.  Neurological: She is alert and oriented to person, place, and time.  Skin: Skin is warm and dry.   Results for orders placed or performed during the hospital encounter of  11/30/13 (from the past 24 hour(s))  Urinalysis, Routine w reflex microscopic     Status: None   Collection Time: 11/30/13 12:42 PM  Result Value Ref Range   Color, Urine YELLOW YELLOW   APPearance CLEAR CLEAR   Specific Gravity, Urine 1.020 1.005 - 1.030   pH 6.0 5.0 - 8.0   Glucose, UA NEGATIVE NEGATIVE mg/dL   Hgb urine dipstick NEGATIVE NEGATIVE   Bilirubin Urine NEGATIVE NEGATIVE   Ketones, ur NEGATIVE NEGATIVE mg/dL   Protein, ur NEGATIVE NEGATIVE mg/dL   Urobilinogen, UA 0.2 0.0 - 1.0 mg/dL   Nitrite NEGATIVE NEGATIVE   Leukocytes, UA NEGATIVE NEGATIVE  Pregnancy, urine POC     Status: Abnormal   Collection Time: 11/30/13 12:47 PM  Result Value Ref Range   Preg Test, Ur POSITIVE (A) NEGATIVE  CBC     Status: Abnormal   Collection Time: 11/30/13  1:50 PM  Result Value Ref Range   WBC 12.0 (H) 4.0 - 10.5 K/uL   RBC 4.36 3.87 - 5.11 MIL/uL   Hemoglobin 14.3 12.0 - 15.0 g/dL   HCT 41.1 36.0 - 46.0 %   MCV 94.3 78.0 - 100.0 fL   MCH 32.8 26.0 - 34.0 pg   MCHC 34.8 30.0 - 36.0 g/dL   RDW 12.5 11.5 - 15.5 %   Platelets 227 150 - 400 K/uL  hCG, quantitative, pregnancy     Status: Abnormal   Collection Time: 11/30/13  1:50 PM  Result Value Ref Range   hCG, Beta Chain, Quant, S 107530 (H) <5 mIU/mL  Wet prep, genital     Status: Abnormal   Collection Time: 11/30/13  2:09 PM  Result Value Ref Range   Yeast Wet Prep HPF POC NONE SEEN NONE SEEN   Trich, Wet Prep NONE SEEN NONE SEEN   Clue Cells Wet Prep HPF POC NONE SEEN NONE SEEN   WBC, Wet Prep HPF POC FEW (A) NONE SEEN    US Ob Comp Less 14 Wks  11/30/2013   CLINICAL DATA:  Pregnant, abdominal pain  EXAM: OBSTETRIC <14 WK [place] AND TRANSVAGINAL OB US  TECHNIQUE: Both transabdominal and transvaginal ultrasound examinations were performed for complete evaluation of the gestation as well as the maternal uterus, adnexal regions, and pelvic cul-de-sac. Transvaginal technique was performed to assess early pregnancy.  COMPARISON:   None.  FINDINGS: Intrauterine gestational sac: Visualized/normal in shape.  Yolk sac:  Present  Embryo:  Present  Cardiac Activity: Present  Heart Rate:  172 bpm  CRL:   21.4  mm   8 w 6 d                  [place] EDC: 07/06/2014  Maternal uterus/adnexae: Small subchorionic hemorrhage.  Right ovary is within normal limits, measuring 2.9 x 1.9 x 2.3 cm.  Left ovary is within normal limits, measuring 1.5 x 1.1 x 1.1 cm.  No free fluid.  IMPRESSION: Single live intrauterine gestation, measuring 8 weeks 6 days by crown-rump length.   Electronically Signed   By: Bertis Ruddy  [place] Pink M.D.   On: 11/30/2013 15:25   US Ob Transvaginal  11/30/2013   CLINICAL DATA:  Pregnant, abdominal pain  EXAM: OBSTETRIC <14 WK [place] AND TRANSVAGINAL OB US  TECHNIQUE: Both transabdominal and transvaginal ultrasound examinations were performed for complete evaluation of the gestation as well as the maternal uterus, adnexal regions, and pelvic cul-de-sac. Transvaginal technique was performed to assess early pregnancy.  COMPARISON:  None.  FINDINGS: Intrauterine gestational sac: Visualized/normal in shape.  Yolk sac:  Present  Embryo:  Present  Cardiac Activity: Present  Heart Rate:  172 bpm  CRL:   21.4  mm   8 w 6 d                  [place] EDC: 07/06/2014  Maternal uterus/adnexae: Small subchorionic hemorrhage.  Right ovary is within normal limits, measuring 2.9 x 1.9 x 2.3 cm.  Left ovary is within normal limits, measuring 1.5 x 1.1 x 1.1 cm.  No free fluid.  IMPRESSION: Single live intrauterine gestation, measuring 8 weeks 6 days by crown-rump length.   Electronically Signed   By: Julian Hy M.D.   On: 11/30/2013 15:25    MAU Course  Procedures  MDM Ectopic pregnancy work up (hcg, ultrasound, pelvic exam) -wet prep, gc testing   Assessment and Plan  1.  Abdominal cramping  - rule out ectopic pregnancy   - HCG >100,000   - U/S showed intrauterine pregnancy. Patient notified of small subchorionic hemorrhage  2. Vaginal  discharge  - wet prep- negative  - gc test- will follow up if positive  3. Pregnancy  - Will follow up with prenatal care at outside facility or clinic.  Pt given contact information.  Wynonia Lawman K 11/30/2013, 1:26 PM   I have seen this patient and agree with the above PA student's note.  [place], [place] Certified Nurse-Midwife

## 2013-11-30 NOTE — MAU Note (Signed)
Patient states she has had a pregnancy test at home that was slightly positive. States she started cramping last night. Has had a vaginal discharge for a while. Patient has a 14 month old and states her periods have not been regular since that delivery. Nausea, no vomiting.

## 2013-12-01 LAB — GC/CHLAMYDIA PROBE AMP
CT PROBE, AMP APTIMA: NEGATIVE
GC Probe RNA: NEGATIVE

## 2013-12-09 ENCOUNTER — Encounter: Payer: Self-pay | Admitting: Obstetrics & Gynecology

## 2013-12-24 ENCOUNTER — Other Ambulatory Visit: Payer: Self-pay | Admitting: Obstetrics & Gynecology

## 2013-12-24 ENCOUNTER — Encounter: Payer: Self-pay | Admitting: Obstetrics & Gynecology

## 2013-12-24 ENCOUNTER — Ambulatory Visit (INDEPENDENT_AMBULATORY_CARE_PROVIDER_SITE_OTHER): Payer: Medicaid Other | Admitting: Obstetrics & Gynecology

## 2013-12-24 VITALS — BP 129/74 | HR 83 | Temp 97.8°F | Wt 169.0 lb

## 2013-12-24 DIAGNOSIS — O0991 Supervision of high risk pregnancy, unspecified, first trimester: Secondary | ICD-10-CM

## 2013-12-24 DIAGNOSIS — O0990 Supervision of high risk pregnancy, unspecified, unspecified trimester: Secondary | ICD-10-CM | POA: Insufficient documentation

## 2013-12-24 DIAGNOSIS — Z369 Encounter for antenatal screening, unspecified: Secondary | ICD-10-CM

## 2013-12-24 LAB — POCT URINALYSIS DIP (DEVICE)
BILIRUBIN URINE: NEGATIVE
Glucose, UA: NEGATIVE mg/dL
HGB URINE DIPSTICK: NEGATIVE
KETONES UR: NEGATIVE mg/dL
Leukocytes, UA: NEGATIVE
NITRITE: NEGATIVE
Protein, ur: NEGATIVE mg/dL
SPECIFIC GRAVITY, URINE: 1.015 (ref 1.005–1.030)
Urobilinogen, UA: 0.2 mg/dL (ref 0.0–1.0)
pH: 7 (ref 5.0–8.0)

## 2013-12-24 LAB — HIV ANTIBODY (ROUTINE TESTING W REFLEX): HIV 1&2 Ab, 4th Generation: NONREACTIVE

## 2013-12-24 NOTE — Progress Notes (Signed)
Weight gain 25-35 lbs New ob packet given Flu vaccine given from work, pt to bring in documentation

## 2013-12-24 NOTE — Progress Notes (Signed)
   Subjective: [name]    Dana Gonzalez is a [id number] [redacted]w[redacted]d being seen today for her first obstetrical visit.  Her obstetrical history is significant for h/o stillbirth . Patient does intend to breast feed. Pregnancy history fully reviewed.  Patient reports no complaints.  Filed Vitals:   12/24/13 0905  BP: 129/74  Pulse: 83  Temp: 97.8 F (36.6 C)  Weight: 169 lb (76.658 kg)    HISTORY: OB History  Gravida Para Term Preterm AB SAB TAB Ectopic Multiple Living  4 3 2 1  0 0 0 0 0 2    # Outcome Date GA Lbr Len/2nd Weight Sex Delivery Anes PTL Lv  4 Current           3 Term 02/07/13 [redacted]w[redacted]d 12:26 / 00:37 8 lb 15.6 oz (4.07 kg) F Vag-Spont EPI  Y  2 Term 05/18/08 [redacted]w[redacted]d  8 lb 6 oz (3.799 kg) F Vag-Spont EPI  Y  1 Preterm 10/22/06 [redacted]w[redacted]d    Vag-Spont Local  FD     Comments: amniotic band around umbilical cord     Past Medical History  Diagnosis Date  . Thyroid disease   . Headache(784.0)   . Infection     UTI   Past Surgical History  Procedure Laterality Date  . Vaginal delivery      X 2  . Mouth surgery     Family History  Problem Relation Age of Onset  . Cancer Mother     breast to bone  . Hypertension Father   . Diabetes Maternal Grandmother   . Stroke Maternal Grandmother   . Heart disease Paternal Grandmother      Exam    Uterus:     Pelvic Exam:    Perineum: No Hemorrhoids   Vulva: normal   Vagina:  normal mucosa   pH:     Cervix: no lesions   Adnexa: normal adnexa   Bony Pelvis: gynecoid  System: Breast:      Skin: normal coloration and turgor, no rashes    Neurologic: oriented, normal mood   Extremities: normal strength, tone, and muscle mass   HEENT thyroid without masses   Mouth/Teeth dental hygiene good   Neck supple   Cardiovascular: regular rate and rhythm   Respiratory:  appears well, vitals normal, no respiratory distress, acyanotic, normal RR   Abdomen: soft, non-tender; bowel sounds normal; no masses,  no organomegaly   Urinary:  urethral meatus normal      Assessment:    Pregnancy: [id number] Patient Active Problem List   Diagnosis Date Noted  . Supervision of high risk pregnancy, antepartum 12/24/2013  . History of stillbirth 01/25/2013        Plan:     Initial labs drawn. Prenatal vitamins. Problem list reviewed and updated. Genetic Screening discussed First Screen: ordered.  Ultrasound discussed; fetal survey: 18-20 weeks.  Follow up in 4 weeks. 50% of 30 min visit spent on counseling and coordination of care.      Janelle Culton 12/24/2013

## 2013-12-24 NOTE — Patient Instructions (Signed)
Prenatal Care  WHAT IS PRENATAL CARE?  Prenatal care means health care during your pregnancy, before your baby is born. It is very important to take care of yourself and your baby during your pregnancy by:   Getting early prenatal care. If you know you are pregnant, or think you might be pregnant, call your health care provider as soon as possible. Schedule a visit for a prenatal exam.  Getting regular prenatal care. Follow your health care provider's schedule for blood and other necessary tests. Do not miss appointments.  Doing everything you can to keep yourself and your baby healthy during your pregnancy.  Getting complete care. Prenatal care should include evaluation of the medical, dietary, educational, psychological, and social needs of you and your significant other. The medical and genetic history of your family and the family of your baby's father should be discussed with your health care provider.  Discussing with your health care provider:  Prescription, over-the-counter, and herbal medicines that you take.  Any history of substance abuse, alcohol use, smoking, and illegal drug use.  Any history of domestic abuse and violence.  Immunizations you have received.  Your nutrition and diet.  The amount of exercise you do.  Any environmental and occupational hazards to which you are exposed.  History of sexually transmitted infections for both you and your partner.  Previous pregnancies you have had. WHY IS PRENATAL CARE SO IMPORTANT?  By regularly seeing your health care provider, you help ensure that problems can be identified early so that they can be treated as soon as possible. Other problems might be prevented. Many studies have shown that early and regular prenatal care is important for the health of mothers and their babies.  HOW CAN I TAKE CARE OF MYSELF WHILE I AM PREGNANT?  Here are ways to take care of yourself and your baby:   Start or continue taking your  multivitamin with 400 micrograms (mcg) of folic acid every day.  Get early and regular prenatal care. It is very important to see a health care provider during your pregnancy. Your health care provider will check at each visit to make sure that you and your baby are healthy. If there are any problems, action can be taken right away to help you and your baby.  Eat a healthy diet that includes:  Fruits.  Vegetables.  Foods low in saturated fat.  Whole grains.  Calcium-rich foods, such as milk, yogurt, and hard cheeses.  Drink 6-8 glasses of liquids a day.  Unless your health care provider tells you not to, try to be physically active for 30 minutes, most days of the week. If you are pressed for time, you can get your activity in through 10-minute segments, three times a day.  Do not smoke, drink alcohol, or use drugs. These can cause long-term damage to your baby. Talk with your health care provider about steps to take to stop smoking. Talk with a member of your faith community, a counselor, a trusted friend, or your health care provider if you are concerned about your alcohol or drug use.  Ask your health care provider before taking any medicine, even over-the-counter medicines. Some medicines are not safe to take during pregnancy.  Get plenty of rest and sleep.  Avoid hot tubs and saunas during pregnancy.  Do not have X-rays taken unless absolutely necessary and with the recommendation of your health care provider. A lead shield can be placed on your abdomen to protect your baby when   X-rays are taken in other parts of your body.  Do not empty the cat litter when you are pregnant. It may contain a parasite that causes an infection called toxoplasmosis, which can cause birth defects. Also, use gloves when working in garden areas used by cats.  Do not eat uncooked or undercooked meats or fish.  Do not eat soft, mold-ripened cheeses (Brie, Camembert, and chevre) or soft, blue-veined  cheese (Danish blue and Roquefort).  Stay away from toxic chemicals like:  Insecticides.  Solvents (some cleaners or paint thinners).  Lead.  Mercury.  Sexual intercourse may continue until the end of the pregnancy, unless you have a medical problem or there is a problem with the pregnancy and your health care provider tells you not to.  Do not wear high-heel shoes, especially during the second half of the pregnancy. You can lose your balance and fall.  Do not take long trips, unless absolutely necessary. Be sure to see your health care provider before going on the trip.  Do not sit in one position for more than 2 hours when on a trip.  Take a copy of your medical records when going on a trip. Know where a hospital is located in the city you are visiting, in case of an emergency.  Most dangerous household products will have pregnancy warnings on their labels. Ask your health care provider about products if you are unsure.  Limit or eliminate your caffeine intake from coffee, tea, sodas, medicines, and chocolate.  Many women continue working through pregnancy. Staying active might help you stay healthier. If you have a question about the safety or the hours you work at your particular job, talk with your health care provider.  Get informed:  Read books.  Watch videos.  Go to childbirth classes for you and your significant other.  Talk with experienced moms.  Ask your health care provider about childbirth education classes for you and your partner. Classes can help you and your partner prepare for the birth of your baby.  Ask about a baby doctor (pediatrician) and methods and pain medicine for labor, delivery, and possible cesarean delivery. HOW OFTEN SHOULD I SEE MY HEALTH CARE PROVIDER DURING PREGNANCY?  Your health care provider will give you a schedule for your prenatal visits. You will have visits more often as you get closer to the end of your pregnancy. An average  pregnancy lasts about 40 weeks.  A typical schedule includes visiting your health care provider:   About once each month during your first 6 months of pregnancy.  Every 2 weeks during the next 2 months.  Weekly in the last month, until the delivery date. Your health care provider will probably want to see you more often if:  You are older than 35 years.  Your pregnancy is high risk because you have certain health problems or problems with the pregnancy, such as:  Diabetes.  High blood pressure.  The baby is not growing on schedule, according to the dates of the pregnancy. Your health care provider will do special tests to make sure you and your baby are not having any serious problems. WHAT HAPPENS DURING PRENATAL VISITS?   At your first prenatal visit, your health care provider will do a physical exam and talk to you about your health history and the health history of your partner and your family. Your health care provider will be able to tell you what date to expect your baby to be born on.  Your   first physical exam will include checks of your blood pressure, measurements of your height and weight, and an exam of your pelvic organs. Your health care provider will do a Pap test if you have not had one recently and will do cultures of your cervix to make sure there is no infection.  At each prenatal visit, there will be tests of your blood, urine, blood pressure, weight, and the progress of the baby will be checked.  At your later prenatal visits, your health care provider will check how you are doing and how your baby is developing. You may have a number of tests done as your pregnancy progresses.  Ultrasound exams are often used to check on your baby's growth and health.  You may have more urine and blood tests, as well as special tests, if needed. These may include amniocentesis to examine fluid in the pregnancy sac, stress tests to check how the baby responds to contractions, or a  biophysical profile to measure your baby's well-being. Your health care provider will explain the tests and why they are necessary.  You should be tested for high blood sugar (gestational diabetes) between the 24th and 28th weeks of your pregnancy.  You should discuss with your health care provider your plans to breastfeed or bottle-feed your baby.  Each visit is also a chance for you to learn about staying healthy during pregnancy and to ask questions. Document Released: 12/27/2002 Document Revised: 12/29/2012 Document Reviewed: 03/10/2013 Exit[employer] Patient Information 2015 [employer]. This information is not intended to replace advice given to you by your health care provider. Make sure you discuss any questions you have with your health care provider.  

## 2013-12-24 NOTE — Progress Notes (Signed)
Scheduled with MFM 12/22 for first trimester screen

## 2013-12-27 LAB — OBSTETRIC PANEL
Antibody Screen: NEGATIVE
Basophils Absolute: 0 10*3/uL (ref 0.0–0.1)
Basophils Relative: 0 % (ref 0–1)
Eosinophils Absolute: 0.1 10*3/uL (ref 0.0–0.7)
Eosinophils Relative: 1 % (ref 0–5)
HCT: 42.1 % (ref 36.0–46.0)
HEMOGLOBIN: 14.8 g/dL (ref 12.0–15.0)
Hepatitis B Surface Ag: NEGATIVE
LYMPHS ABS: 1.4 10*3/uL (ref 0.7–4.0)
Lymphocytes Relative: 16 % (ref 12–46)
MCH: 32.4 pg (ref 26.0–34.0)
MCHC: 35.2 g/dL (ref 30.0–36.0)
MCV: 92.1 fL (ref 78.0–100.0)
MONOS PCT: 4 % (ref 3–12)
MPV: 9.8 fL (ref 9.4–12.4)
Monocytes Absolute: 0.3 10*3/uL (ref 0.1–1.0)
NEUTROS ABS: 6.8 10*3/uL (ref 1.7–7.7)
NEUTROS PCT: 79 % — AB (ref 43–77)
Platelets: 224 10*3/uL (ref 150–400)
RBC: 4.57 MIL/uL (ref 3.87–5.11)
RDW: 13.2 % (ref 11.5–15.5)
RH TYPE: POSITIVE
Rubella: 5.04 Index — ABNORMAL HIGH (ref <0.90)
WBC: 8.6 10*3/uL (ref 4.0–10.5)

## 2013-12-27 LAB — CULTURE, OB URINE: Colony Count: 65000

## 2013-12-28 ENCOUNTER — Ambulatory Visit (HOSPITAL_COMMUNITY)
Admission: RE | Admit: 2013-12-28 | Discharge: 2013-12-28 | Disposition: A | Discharge status: Home / Self Care | Payer: Medicaid Other | Source: Ambulatory Visit | Attending: Obstetrics & Gynecology | Admitting: Obstetrics & Gynecology

## 2013-12-28 DIAGNOSIS — Z369 Encounter for antenatal screening, unspecified: Secondary | ICD-10-CM | POA: Insufficient documentation

## 2013-12-28 DIAGNOSIS — Z36 Encounter for antenatal screening of mother: Secondary | ICD-10-CM | POA: Insufficient documentation

## 2013-12-28 DIAGNOSIS — Z3A12 12 weeks gestation of pregnancy: Secondary | ICD-10-CM | POA: Insufficient documentation

## 2014-01-05 ENCOUNTER — Other Ambulatory Visit (HOSPITAL_COMMUNITY): Payer: Self-pay

## 2014-01-07 NOTE — L&D Delivery Note (Signed)
Patient is 24 y.o. [id number] [redacted]w[redacted]d admitted in active preterm labor, hx of   Delivery Note At 9:40 AM a viable female was delivered via Vaginal, Spontaneous Delivery (Presentation: ; Occiput Anterior).  APGAR: 9, 9; weight pending.   Placenta status: Intact, Spontaneous.  Cord: 3 vessels with the following complications: None.  Anesthesia: Epidural  Episiotomy: None Lacerations: None Est. Blood Loss (mL): 651  Mom to postpartum.  Baby to Couplet care / Skin to Skin.  Upon arrival patient was complete and pushing. She pushed with good maternal effort to deliver a healthy baby girl. Baby delivered without difficulty, was noted to have good tone and place on maternal abdomen for oral suctioning, drying and stimulation. Delayed cord clamping performed. Patient with blood loss concerning for placental abruption. FHT was reassuring throughout delivery. Placenta was delivered intact with 3V cord. Pitocin was started and uterus massaged. Patient had continued bleeding and vaginal vault was cleared of clots and placental membrane removed. Bleeding then slowed. Cytotec 800 was placed rectally. Counts of sharps, instruments, and lap pads were all correct. Vaginal canal and perineum was inspected and only with superficial perineum laceration that did not require repair.   Luiz Blare, DO 06/04/2014, 10:06 AM PGY-1, [place]

## 2014-01-25 ENCOUNTER — Ambulatory Visit (INDEPENDENT_AMBULATORY_CARE_PROVIDER_SITE_OTHER): Payer: Self-pay | Admitting: Physician Assistant

## 2014-01-25 VITALS — BP 126/66 | HR 82 | Wt 173.7 lb

## 2014-01-25 DIAGNOSIS — Z3492 Encounter for supervision of normal pregnancy, unspecified, second trimester: Secondary | ICD-10-CM

## 2014-01-25 LAB — POCT URINALYSIS DIP (DEVICE)
Bilirubin Urine: NEGATIVE
Glucose, UA: NEGATIVE mg/dL
Hgb urine dipstick: NEGATIVE
KETONES UR: NEGATIVE mg/dL
NITRITE: NEGATIVE
PH: 7 (ref 5.0–8.0)
Protein, ur: NEGATIVE mg/dL
Specific Gravity, Urine: 1.02 (ref 1.005–1.030)
Urobilinogen, UA: 0.2 mg/dL (ref 0.0–1.0)

## 2014-01-25 NOTE — Progress Notes (Signed)
Pt seen with NP student, Micker Samios.  I agree with assessment and plan.  KTC

## 2014-01-25 NOTE — Progress Notes (Signed)
Pt reports heartburn when lying flat, denies n/v. Prescribed Zantac OTC and given dietary recommendations. Pt denies vaginal bleeding, LOF, abd pain. Pt reports birth plan for vaginal birth and LORC, educated on IUD vs nexplanon. Pt reports plan to breastfeed. Scheduled for anatomical [place].  Dana Bianca, FNP-S

## 2014-01-25 NOTE — Progress Notes (Signed)
No complaints today.

## 2014-01-25 NOTE — Patient Instructions (Signed)
Second Trimester of Pregnancy The second trimester is from week 13 through week 28, months 4 through 6. The second trimester is often a time when you feel your best. Your body has also adjusted to being pregnant, and you begin to feel better physically. Usually, morning sickness has lessened or quit completely, you may have more energy, and you may have an increase in appetite. The second trimester is also a time when the fetus is growing rapidly. At the end of the sixth month, the fetus is about 9 inches long and weighs about 1 pounds. You will likely begin to feel the baby move (quickening) between 18 and 20 weeks of the pregnancy. BODY CHANGES Your body goes through many changes during pregnancy. The changes vary from woman to woman.   Your weight will continue to increase. You will notice your lower abdomen bulging out.  You may begin to get stretch marks on your hips, abdomen, and breasts.  You may develop headaches that can be relieved by medicines approved by your health care provider.  You may urinate more often because the fetus is pressing on your bladder.  You may develop or continue to have heartburn as a result of your pregnancy.  You may develop constipation because certain hormones are causing the muscles that push waste through your intestines to slow down.  You may develop hemorrhoids or swollen, bulging veins (varicose veins).  You may have back pain because of the weight gain and pregnancy hormones relaxing your joints between the bones in your pelvis and as a result of a shift in weight and the muscles that support your balance.  Your breasts will continue to grow and be tender.  Your gums may bleed and may be sensitive to brushing and flossing.  Dark spots or blotches (chloasma, mask of pregnancy) may develop on your face. This will likely fade after the baby is born.  A dark line from your belly button to the pubic area (linea nigra) may appear. This will likely fade  after the baby is born.  You may have changes in your hair. These can include thickening of your hair, rapid growth, and changes in texture. Some women also have hair loss during or after pregnancy, or hair that feels dry or thin. Your hair will most likely return to normal after your baby is born. WHAT TO EXPECT AT YOUR PRENATAL VISITS During a routine prenatal visit:  You will be weighed to make sure you and the fetus are growing normally.  Your blood pressure will be taken.  Your abdomen will be measured to track your baby's growth.  The fetal heartbeat will be listened to.  Any test results from the previous visit will be discussed. Your health care provider may ask you:  How you are feeling.  If you are feeling the baby move.  If you have had any abnormal symptoms, such as leaking fluid, bleeding, severe headaches, or abdominal cramping.  If you have any questions. Other tests that may be performed during your second trimester include:  Blood tests that check for:  Low iron levels (anemia).  Gestational diabetes (between 24 and 28 weeks).  Rh antibodies.  Urine tests to check for infections, diabetes, or protein in the urine.  An ultrasound to confirm the proper growth and development of the baby.  An amniocentesis to check for possible genetic problems.  Fetal screens for spina bifida and Down syndrome. HOME CARE INSTRUCTIONS   Avoid all smoking, herbs, alcohol, and unprescribed   drugs. These chemicals affect the formation and growth of the baby.  Follow your health care provider's instructions regarding medicine use. There are medicines that are either safe or unsafe to take during pregnancy.  Exercise only as directed by your health care provider. Experiencing uterine cramps is a good sign to stop exercising.  Continue to eat regular, healthy meals.  Wear a good support bra for breast tenderness.  Do not use hot tubs, steam rooms, or saunas.  Wear your  seat belt at all times when driving.  Avoid raw meat, uncooked cheese, cat litter boxes, and soil used by cats. These carry germs that can cause birth defects in the baby.  Take your prenatal vitamins.  Try taking a stool softener (if your health care provider approves) if you develop constipation. Eat more high-fiber foods, such as fresh vegetables or fruit and whole grains. Drink plenty of fluids to keep your urine clear or pale yellow.  Take warm sitz baths to soothe any pain or discomfort caused by hemorrhoids. Use hemorrhoid cream if your health care provider approves.  If you develop varicose veins, wear support hose. Elevate your feet for 15 minutes, 3-4 times a day. Limit salt in your diet.  Avoid heavy lifting, wear low heel shoes, and practice good posture.  Rest with your legs elevated if you have leg cramps or low back pain.  Visit your dentist if you have not gone yet during your pregnancy. Use a soft toothbrush to brush your teeth and be gentle when you floss.  A sexual relationship may be continued unless your health care provider directs you otherwise.  Continue to go to all your prenatal visits as directed by your health care provider. SEEK MEDICAL CARE IF:   You have dizziness.  You have mild pelvic cramps, pelvic pressure, or nagging pain in the abdominal area.  You have persistent nausea, vomiting, or diarrhea.  You have a bad smelling vaginal discharge.  You have pain with urination. SEEK IMMEDIATE MEDICAL CARE IF:   You have a fever.  You are leaking fluid from your vagina.  You have spotting or bleeding from your vagina.  You have severe abdominal cramping or pain.  You have rapid weight gain or loss.  You have shortness of breath with chest pain.  You notice sudden or extreme swelling of your face, hands, ankles, feet, or legs.  You have not felt your baby move in over an hour.  You have severe headaches that do not go away with  medicine.  You have vision changes. Document Released: 12/18/2000 Document Revised: 12/29/2012 Document Reviewed: 02/25/2012 ExitCare Patient Information 2015 [employer]. This information is not intended to replace advice given to you by your health care provider. Make sure you discuss any questions you have with your health care provider.  

## 2014-02-11 ENCOUNTER — Ambulatory Visit (HOSPITAL_COMMUNITY)
Admission: RE | Admit: 2014-02-11 | Discharge: 2014-02-11 | Disposition: A | Discharge status: Home / Self Care | Payer: Medicaid Other | Source: Ambulatory Visit | Attending: Physician Assistant | Admitting: Physician Assistant

## 2014-02-11 DIAGNOSIS — Z3A19 19 weeks gestation of pregnancy: Secondary | ICD-10-CM | POA: Insufficient documentation

## 2014-02-11 DIAGNOSIS — Z36 Encounter for antenatal screening of mother: Secondary | ICD-10-CM | POA: Insufficient documentation

## 2014-02-11 DIAGNOSIS — O09292 Supervision of pregnancy with other poor reproductive or obstetric history, second trimester: Secondary | ICD-10-CM | POA: Insufficient documentation

## 2014-02-11 DIAGNOSIS — Z3689 Encounter for other specified antenatal screening: Secondary | ICD-10-CM | POA: Insufficient documentation

## 2014-02-11 DIAGNOSIS — Z3492 Encounter for supervision of normal pregnancy, unspecified, second trimester: Secondary | ICD-10-CM | POA: Insufficient documentation

## 2014-02-14 ENCOUNTER — Telehealth: Payer: Self-pay

## 2014-02-14 DIAGNOSIS — O0991 Supervision of high risk pregnancy, unspecified, first trimester: Secondary | ICD-10-CM

## 2014-02-14 NOTE — Telephone Encounter (Signed)
-----   Message from Paticia Stack, PA-C sent at 02/13/2014  3:51 PM EST ----- Regarding: needs u/s Please schedule follow up ultrasound around 3/4 to complete anatomy survey.   KTC

## 2014-02-14 NOTE — Telephone Encounter (Signed)
F/u U/S scheduled for 03/11/14 at 1130. Called patient-- straight to voicemail. Left message informing her of U/S appointment date, time and location as well as clinic appointment on 2/16 and to call clinic with questions or concerns.

## 2014-02-22 ENCOUNTER — Encounter: Payer: Self-pay | Admitting: Physician Assistant

## 2014-02-22 ENCOUNTER — Ambulatory Visit (INDEPENDENT_AMBULATORY_CARE_PROVIDER_SITE_OTHER): Payer: Self-pay | Admitting: Physician Assistant

## 2014-02-22 VITALS — BP 121/68 | HR 101 | Temp 98.2°F | Wt 173.6 lb

## 2014-02-22 DIAGNOSIS — O0992 Supervision of high risk pregnancy, unspecified, second trimester: Secondary | ICD-10-CM

## 2014-02-22 LAB — POCT URINALYSIS DIP (DEVICE)
Glucose, UA: NEGATIVE mg/dL
HGB URINE DIPSTICK: NEGATIVE
KETONES UR: NEGATIVE mg/dL
LEUKOCYTES UA: NEGATIVE
NITRITE: NEGATIVE
PH: 6.5 (ref 5.0–8.0)
PROTEIN: 30 mg/dL — AB
Specific Gravity, Urine: 1.02 (ref 1.005–1.030)
Urobilinogen, UA: 1 mg/dL (ref 0.0–1.0)

## 2014-02-22 NOTE — Progress Notes (Signed)
20 weeks, stable.  Just getting over GI bug with lots of nausea and vomiting.  No more today.   Endorses fetal movement.  Denies LOF, vag bleeding, dysuria.   PNV qd RTC 4 weeks

## 2014-02-22 NOTE — Patient Instructions (Signed)
Second Trimester of Pregnancy The second trimester is from week 13 through week 28, months 4 through 6. The second trimester is often a time when you feel your best. Your body has also adjusted to being pregnant, and you begin to feel better physically. Usually, morning sickness has lessened or quit completely, you may have more energy, and you may have an increase in appetite. The second trimester is also a time when the fetus is growing rapidly. At the end of the sixth month, the fetus is about 9 inches long and weighs about 1 pounds. You will likely begin to feel the baby move (quickening) between 18 and 20 weeks of the pregnancy. BODY CHANGES Your body goes through many changes during pregnancy. The changes vary from woman to woman.   Your weight will continue to increase. You will notice your lower abdomen bulging out.  You may begin to get stretch marks on your hips, abdomen, and breasts.  You may develop headaches that can be relieved by medicines approved by your health care provider.  You may urinate more often because the fetus is pressing on your bladder.  You may develop or continue to have heartburn as a result of your pregnancy.  You may develop constipation because certain hormones are causing the muscles that push waste through your intestines to slow down.  You may develop hemorrhoids or swollen, bulging veins (varicose veins).  You may have back pain because of the weight gain and pregnancy hormones relaxing your joints between the bones in your pelvis and as a result of a shift in weight and the muscles that support your balance.  Your breasts will continue to grow and be tender.  Your gums may bleed and may be sensitive to brushing and flossing.  Dark spots or blotches (chloasma, mask of pregnancy) may develop on your face. This will likely fade after the baby is born.  A dark line from your belly button to the pubic area (linea nigra) may appear. This will likely fade  after the baby is born.  You may have changes in your hair. These can include thickening of your hair, rapid growth, and changes in texture. Some women also have hair loss during or after pregnancy, or hair that feels dry or thin. Your hair will most likely return to normal after your baby is born. WHAT TO EXPECT AT YOUR PRENATAL VISITS During a routine prenatal visit:  You will be weighed to make sure you and the fetus are growing normally.  Your blood pressure will be taken.  Your abdomen will be measured to track your baby's growth.  The fetal heartbeat will be listened to.  Any test results from the previous visit will be discussed. Your health care provider may ask you:  How you are feeling.  If you are feeling the baby move.  If you have had any abnormal symptoms, such as leaking fluid, bleeding, severe headaches, or abdominal cramping.  If you have any questions. Other tests that may be performed during your second trimester include:  Blood tests that check for:  Low iron levels (anemia).  Gestational diabetes (between 24 and 28 weeks).  Rh antibodies.  Urine tests to check for infections, diabetes, or protein in the urine.  An ultrasound to confirm the proper growth and development of the baby.  An amniocentesis to check for possible genetic problems.  Fetal screens for spina bifida and Down syndrome. HOME CARE INSTRUCTIONS   Avoid all smoking, herbs, alcohol, and unprescribed   drugs. These chemicals affect the formation and growth of the baby.  Follow your health care provider's instructions regarding medicine use. There are medicines that are either safe or unsafe to take during pregnancy.  Exercise only as directed by your health care provider. Experiencing uterine cramps is a good sign to stop exercising.  Continue to eat regular, healthy meals.  Wear a good support bra for breast tenderness.  Do not use hot tubs, steam rooms, or saunas.  Wear your  seat belt at all times when driving.  Avoid raw meat, uncooked cheese, cat litter boxes, and soil used by cats. These carry germs that can cause birth defects in the baby.  Take your prenatal vitamins.  Try taking a stool softener (if your health care provider approves) if you develop constipation. Eat more high-fiber foods, such as fresh vegetables or fruit and whole grains. Drink plenty of fluids to keep your urine clear or pale yellow.  Take warm sitz baths to soothe any pain or discomfort caused by hemorrhoids. Use hemorrhoid cream if your health care provider approves.  If you develop varicose veins, wear support hose. Elevate your feet for 15 minutes, 3-4 times a day. Limit salt in your diet.  Avoid heavy lifting, wear low heel shoes, and practice good posture.  Rest with your legs elevated if you have leg cramps or low back pain.  Visit your dentist if you have not gone yet during your pregnancy. Use a soft toothbrush to brush your teeth and be gentle when you floss.  A sexual relationship may be continued unless your health care provider directs you otherwise.  Continue to go to all your prenatal visits as directed by your health care provider. SEEK MEDICAL CARE IF:   You have dizziness.  You have mild pelvic cramps, pelvic pressure, or nagging pain in the abdominal area.  You have persistent nausea, vomiting, or diarrhea.  You have a bad smelling vaginal discharge.  You have pain with urination. SEEK IMMEDIATE MEDICAL CARE IF:   You have a fever.  You are leaking fluid from your vagina.  You have spotting or bleeding from your vagina.  You have severe abdominal cramping or pain.  You have rapid weight gain or loss.  You have shortness of breath with chest pain.  You notice sudden or extreme swelling of your face, hands, ankles, feet, or legs.  You have not felt your baby move in over an hour.  You have severe headaches that do not go away with  medicine.  You have vision changes. Document Released: 12/18/2000 Document Revised: 12/29/2012 Document Reviewed: 02/25/2012 ExitCare Patient Information 2015 [employer]. This information is not intended to replace advice given to you by your health care provider. Make sure you discuss any questions you have with your health care provider.  

## 2014-02-22 NOTE — Progress Notes (Signed)
Edema- hands  Pressure- when stand up from sitting position

## 2014-03-11 ENCOUNTER — Ambulatory Visit (HOSPITAL_COMMUNITY)
Admission: RE | Admit: 2014-03-11 | Discharge: 2014-03-11 | Disposition: A | Discharge status: Home / Self Care | Payer: Medicaid Other | Source: Ambulatory Visit | Attending: Physician Assistant | Admitting: Physician Assistant

## 2014-03-11 DIAGNOSIS — O09292 Supervision of pregnancy with other poor reproductive or obstetric history, second trimester: Secondary | ICD-10-CM | POA: Insufficient documentation

## 2014-03-11 DIAGNOSIS — Z3A24 24 weeks gestation of pregnancy: Secondary | ICD-10-CM | POA: Diagnosis not present

## 2014-03-11 DIAGNOSIS — Z36 Encounter for antenatal screening of mother: Secondary | ICD-10-CM | POA: Insufficient documentation

## 2014-03-11 DIAGNOSIS — O0991 Supervision of high risk pregnancy, unspecified, first trimester: Secondary | ICD-10-CM

## 2014-03-11 DIAGNOSIS — Z0489 Encounter for examination and observation for other specified reasons: Secondary | ICD-10-CM | POA: Insufficient documentation

## 2014-03-11 DIAGNOSIS — O09299 Supervision of pregnancy with other poor reproductive or obstetric history, unspecified trimester: Secondary | ICD-10-CM | POA: Insufficient documentation

## 2014-03-11 DIAGNOSIS — IMO0002 Reserved for concepts with insufficient information to code with codable children: Secondary | ICD-10-CM | POA: Insufficient documentation

## 2014-03-11 DIAGNOSIS — Z3A23 23 weeks gestation of pregnancy: Secondary | ICD-10-CM | POA: Insufficient documentation

## 2014-03-22 ENCOUNTER — Ambulatory Visit (INDEPENDENT_AMBULATORY_CARE_PROVIDER_SITE_OTHER): Payer: Self-pay | Admitting: Family Medicine

## 2014-03-22 ENCOUNTER — Encounter: Payer: Self-pay | Admitting: Family Medicine

## 2014-03-22 VITALS — BP 125/69 | HR 89 | Temp 98.1°F | Wt 179.5 lb

## 2014-03-22 DIAGNOSIS — Z8759 Personal history of other complications of pregnancy, childbirth and the puerperium: Secondary | ICD-10-CM

## 2014-03-22 DIAGNOSIS — O09292 Supervision of pregnancy with other poor reproductive or obstetric history, second trimester: Secondary | ICD-10-CM

## 2014-03-22 DIAGNOSIS — O0992 Supervision of high risk pregnancy, unspecified, second trimester: Secondary | ICD-10-CM

## 2014-03-22 LAB — POCT URINALYSIS DIP (DEVICE)
BILIRUBIN URINE: NEGATIVE
Glucose, UA: NEGATIVE mg/dL
Hgb urine dipstick: NEGATIVE
KETONES UR: NEGATIVE mg/dL
NITRITE: NEGATIVE
Protein, ur: NEGATIVE mg/dL
Specific Gravity, Urine: 1.01 (ref 1.005–1.030)
Urobilinogen, UA: 0.2 mg/dL (ref 0.0–1.0)
pH: 6 (ref 5.0–8.0)

## 2014-03-22 NOTE — Patient Instructions (Signed)
Third Trimester of Pregnancy The third trimester is from week 29 through week 42, months 7 through 9. The third trimester is a time when the fetus is growing rapidly. At the end of the ninth month, the fetus is about 20 inches in length and weighs 6-10 pounds.  BODY CHANGES Your body goes through many changes during pregnancy. The changes vary from woman to woman.   Your weight will continue to increase. You can expect to gain 25-35 pounds (11-16 kg) by the end of the pregnancy.  You may begin to get stretch marks on your hips, abdomen, and breasts.  You may urinate more often because the fetus is moving lower into your pelvis and pressing on your bladder.  You may develop or continue to have heartburn as a result of your pregnancy.  You may develop constipation because certain hormones are causing the muscles that push waste through your intestines to slow down.  You may develop hemorrhoids or swollen, bulging veins (varicose veins).  You may have pelvic pain because of the weight gain and pregnancy hormones relaxing your joints between the bones in your pelvis. Backaches may result from overexertion of the muscles supporting your posture.  You may have changes in your hair. These can include thickening of your hair, rapid growth, and changes in texture. Some women also have hair loss during or after pregnancy, or hair that feels dry or thin. Your hair will most likely return to normal after your baby is born.  Your breasts will continue to grow and be tender. A yellow discharge may leak from your breasts called colostrum.  Your belly button may stick out.  You may feel short of breath because of your expanding uterus.  You may notice the fetus "dropping," or moving lower in your abdomen.  You may have a bloody mucus discharge. This usually occurs a few days to a week before labor begins.  Your cervix becomes thin and soft (effaced) near your due date. WHAT TO EXPECT AT YOUR  PRENATAL EXAMS  You will have prenatal exams every 2 weeks until week 36. Then, you will have weekly prenatal exams. During a routine prenatal visit:  You will be weighed to make sure you and the fetus are growing normally.  Your blood pressure is taken.  Your abdomen will be measured to track your baby's growth.  The fetal heartbeat will be listened to.  Any test results from the previous visit will be discussed.  You may have a cervical check near your due date to see if you have effaced. At around 36 weeks, your caregiver will check your cervix. At the same time, your caregiver will also perform a test on the secretions of the vaginal tissue. This test is to determine if a type of bacteria, Group B streptococcus, is present. Your caregiver will explain this further. Your caregiver may ask you:  What your birth plan is.  How you are feeling.  If you are feeling the baby move.  If you have had any abnormal symptoms, such as leaking fluid, bleeding, severe headaches, or abdominal cramping.  If you have any questions. Other tests or screenings that may be performed during your third trimester include:  Blood tests that check for low iron levels (anemia).  Fetal testing to check the health, activity level, and growth of the fetus. Testing is done if you have certain medical conditions or if there are problems during the pregnancy. FALSE LABOR You may feel small, irregular contractions that   eventually go away. These are called Braxton Hicks contractions, or false labor. Contractions may last for hours, days, or even weeks before true labor sets in. If contractions come at regular intervals, intensify, or become painful, it is best to be seen by your caregiver.  SIGNS OF LABOR   Menstrual-like cramps.  Contractions that are 5 minutes apart or less.  Contractions that start on the top of the uterus and spread down to the lower abdomen and back.  A sense of increased pelvic  pressure or back pain.  A watery or bloody mucus discharge that comes from the vagina. If you have any of these signs before the 37th week of pregnancy, call your caregiver right away. You need to go to the hospital to get checked immediately. HOME CARE INSTRUCTIONS   Avoid all smoking, herbs, alcohol, and unprescribed drugs. These chemicals affect the formation and growth of the baby.  Follow your caregiver's instructions regarding medicine use. There are medicines that are either safe or unsafe to take during pregnancy.  Exercise only as directed by your caregiver. Experiencing uterine cramps is a good sign to stop exercising.  Continue to eat regular, healthy meals.  Wear a good support bra for breast tenderness.  Do not use hot tubs, steam rooms, or saunas.  Wear your seat belt at all times when driving.  Avoid raw meat, uncooked cheese, cat litter boxes, and soil used by cats. These carry germs that can cause birth defects in the baby.  Take your prenatal vitamins.  Try taking a stool softener (if your caregiver approves) if you develop constipation. Eat more high-fiber foods, such as fresh vegetables or fruit and whole grains. Drink plenty of fluids to keep your urine clear or pale yellow.  Take warm sitz baths to soothe any pain or discomfort caused by hemorrhoids. Use hemorrhoid cream if your caregiver approves.  If you develop varicose veins, wear support hose. Elevate your feet for 15 minutes, 3-4 times a day. Limit salt in your diet.  Avoid heavy lifting, wear low heal shoes, and practice good posture.  Rest a lot with your legs elevated if you have leg cramps or low back pain.  Visit your dentist if you have not gone during your pregnancy. Use a soft toothbrush to brush your teeth and be gentle when you floss.  A sexual relationship may be continued unless your caregiver directs you otherwise.  Do not travel far distances unless it is absolutely necessary and only  with the approval of your caregiver.  Take prenatal classes to understand, practice, and ask questions about the labor and delivery.  Make a trial run to the hospital.  Pack your hospital bag.  Prepare the baby's nursery.  Continue to go to all your prenatal visits as directed by your caregiver. SEEK MEDICAL CARE IF:  You are unsure if you are in labor or if your water has broken.  You have dizziness.  You have mild pelvic cramps, pelvic pressure, or nagging pain in your abdominal area.  You have persistent nausea, vomiting, or diarrhea.  You have a bad smelling vaginal discharge.  You have pain with urination. SEEK IMMEDIATE MEDICAL CARE IF:   You have a fever.  You are leaking fluid from your vagina.  You have spotting or bleeding from your vagina.  You have severe abdominal cramping or pain.  You have rapid weight loss or gain.  You have shortness of breath with chest pain.  You notice sudden or extreme swelling   of your face, hands, ankles, feet, or legs.  You have not felt your baby move in over an hour.  You have severe headaches that do not go away with medicine.  You have vision changes. Document Released: 12/18/2000 Document Revised: 12/29/2012 Document Reviewed: 02/25/2012 [employer] Patient Information 2015 [employer]. This information is not intended to replace advice given to you by your health care provider. Make sure you discuss any questions you have with your health care provider.  Breastfeeding Deciding to breastfeed is one of the best choices you can make for you and your baby. A change in hormones during pregnancy causes your breast tissue to grow and increases the number and size of your milk ducts. These hormones also allow proteins, sugars, and fats from your blood supply to make breast milk in your milk-producing glands. Hormones prevent breast milk from being released before your baby is born as well as prompt milk flow after birth. Once  breastfeeding has begun, thoughts of your baby, as well as his or her sucking or crying, can stimulate the release of milk from your milk-producing glands.  BENEFITS OF BREASTFEEDING For Your Baby  Your first milk (colostrum) helps your baby's digestive system function better.   There are antibodies in your milk that help your baby fight off infections.   Your baby has a lower incidence of asthma, allergies, and sudden infant death syndrome.   The nutrients in breast milk are better for your baby than infant formulas and are designed uniquely for your baby's needs.   Breast milk improves your baby's brain development.   Your baby is less likely to develop other conditions, such as childhood obesity, asthma, or type 2 diabetes mellitus.  For You   Breastfeeding helps to create a very special bond between you and your baby.   Breastfeeding is convenient. Breast milk is always available at the correct temperature and costs nothing.   Breastfeeding helps to burn calories and helps you lose the weight gained during pregnancy.   Breastfeeding makes your uterus contract to its prepregnancy size faster and slows bleeding (lochia) after you give birth.   Breastfeeding helps to lower your risk of developing type 2 diabetes mellitus, osteoporosis, and breast or ovarian cancer later in life. SIGNS THAT YOUR BABY IS HUNGRY Early Signs of Hunger  Increased alertness or activity.  Stretching.  Movement of the head from side to side.  Movement of the head and opening of the mouth when the corner of the mouth or cheek is stroked (rooting).  Increased sucking sounds, smacking lips, cooing, sighing, or squeaking.  Hand-to-mouth movements.  Increased sucking of fingers or hands. Late Signs of Hunger  Fussing.  Intermittent crying. Extreme Signs of Hunger Signs of extreme hunger will require calming and consoling before your baby will be able to breastfeed successfully. Do not  wait for the following signs of extreme hunger to occur before you initiate breastfeeding:   Restlessness.  A loud, strong cry.   Screaming. BREASTFEEDING BASICS Breastfeeding Initiation  Find a comfortable place to sit or lie down, with your neck and back well supported.  Place a pillow or rolled up blanket under your baby to bring him or her to the level of your breast (if you are seated). Nursing pillows are specially designed to help support your arms and your baby while you breastfeed.  Make sure that your baby's abdomen is facing your abdomen.   Gently massage your breast. With your fingertips, massage from your chest   wall toward your nipple in a circular motion. This encourages milk flow. You may need to continue this action during the feeding if your milk flows slowly.  Support your breast with 4 fingers underneath and your thumb above your nipple. Make sure your fingers are well away from your nipple and your baby's mouth.   Stroke your baby's lips gently with your finger or nipple.   When your baby's mouth is open wide enough, quickly bring your baby to your breast, placing your entire nipple and as much of the colored area around your nipple (areola) as possible into your baby's mouth.   More areola should be visible above your baby's upper lip than below the lower lip.   Your baby's tongue should be between his or her lower gum and your breast.   Ensure that your baby's mouth is correctly positioned around your nipple (latched). Your baby's lips should create a seal on your breast and be turned out (everted).  It is common for your baby to suck about 2-3 minutes in order to start the flow of breast milk. Latching Teaching your baby how to latch on to your breast properly is very important. An improper latch can cause nipple pain and decreased milk supply for you and poor weight gain in your baby. Also, if your baby is not latched onto your nipple properly, he or she  may swallow some air during feeding. This can make your baby fussy. Burping your baby when you switch breasts during the feeding can help to get rid of the air. However, teaching your baby to latch on properly is still the best way to prevent fussiness from swallowing air while breastfeeding. Signs that your baby has successfully latched on to your nipple:    Silent tugging or silent sucking, without causing you pain.   Swallowing heard between every 3-4 sucks.    Muscle movement above and in front of his or her ears while sucking.  Signs that your baby has not successfully latched on to nipple:   Sucking sounds or smacking sounds from your baby while breastfeeding.  Nipple pain. If you think your baby has not latched on correctly, slip your finger into the corner of your baby's mouth to break the suction and place it between your baby's gums. Attempt breastfeeding initiation again. Signs of Successful Breastfeeding Signs from your baby:   A gradual decrease in the number of sucks or complete cessation of sucking.   Falling asleep.   Relaxation of his or her body.   Retention of a small amount of milk in his or her mouth.   Letting go of your breast by himself or herself. Signs from you:  Breasts that have increased in firmness, weight, and size 1-3 hours after feeding.   Breasts that are softer immediately after breastfeeding.  Increased milk volume, as well as a change in milk consistency and color by the fifth day of breastfeeding.   Nipples that are not sore, cracked, or bleeding. Signs That Your Baby is Getting Enough Milk  Wetting at least 3 diapers in a 24-hour period. The urine should be clear and pale yellow by age 5 days.  At least 3 stools in a 24-hour period by age 5 days. The stool should be soft and yellow.  At least 3 stools in a 24-hour period by age 7 days. The stool should be seedy and yellow.  No loss of weight greater than 10% of birth weight  during the first 3   days of age.  Average weight gain of 4-7 ounces (113-198 g) per week after age 4 days.  Consistent daily weight gain by age 5 days, without weight loss after the age of 2 weeks. After a feeding, your baby may spit up a small amount. This is common. BREASTFEEDING FREQUENCY AND DURATION Frequent feeding will help you make more milk and can prevent sore nipples and breast engorgement. Breastfeed when you feel the need to reduce the fullness of your breasts or when your baby shows signs of hunger. This is called "breastfeeding on demand." Avoid introducing a pacifier to your baby while you are working to establish breastfeeding (the first 4-6 weeks after your baby is born). After this time you may choose to use a pacifier. Research has shown that pacifier use during the first year of a baby's life decreases the risk of sudden infant death syndrome (SIDS). Allow your baby to feed on each breast as long as he or she wants. Breastfeed until your baby is finished feeding. When your baby unlatches or falls asleep while feeding from the first breast, offer the second breast. Because newborns are often sleepy in the first few weeks of life, you may need to awaken your baby to get him or her to feed. Breastfeeding times will vary from baby to baby. However, the following rules can serve as a guide to help you ensure that your baby is properly fed:  Newborns (babies 4 weeks of age or younger) may breastfeed every 1-3 hours.  Newborns should not go longer than 3 hours during the day or 5 hours during the night without breastfeeding.  You should breastfeed your baby a minimum of 8 times in a 24-hour period until you begin to introduce solid foods to your baby at around 6 months of age. BREAST MILK PUMPING Pumping and storing breast milk allows you to ensure that your baby is exclusively fed your breast milk, even at times when you are unable to breastfeed. This is especially important if you are  going back to work while you are still breastfeeding or when you are not able to be present during feedings. Your lactation consultant can give you guidelines on how long it is safe to store breast milk.  A breast pump is a machine that allows you to pump milk from your breast into a sterile bottle. The pumped breast milk can then be stored in a refrigerator or freezer. Some breast pumps are operated by hand, while others use electricity. Ask your lactation consultant which type will work best for you. Breast pumps can be purchased, but some hospitals and breastfeeding support groups lease breast pumps on a monthly basis. A lactation consultant can teach you how to hand express breast milk, if you prefer not to use a pump.  CARING FOR YOUR BREASTS WHILE YOU BREASTFEED Nipples can become dry, cracked, and sore while breastfeeding. The following recommendations can help keep your breasts moisturized and healthy:  Avoid using soap on your nipples.   Wear a supportive bra. Although not required, special nursing bras and tank tops are designed to allow access to your breasts for breastfeeding without taking off your entire bra or top. Avoid wearing underwire-style bras or extremely tight bras.  Air dry your nipples for 3-4minutes after each feeding.   Use only cotton bra pads to absorb leaked breast milk. Leaking of breast milk between feedings is normal.   Use lanolin on your nipples after breastfeeding. Lanolin helps to maintain your skin's   normal moisture barrier. If you use pure lanolin, you do not need to wash it off before feeding your baby again. Pure lanolin is not toxic to your baby. You may also hand express a few drops of breast milk and gently massage that milk into your nipples and allow the milk to air dry. In the first few weeks after giving birth, some women experience extremely full breasts (engorgement). Engorgement can make your breasts feel heavy, warm, and tender to the touch.  Engorgement peaks within 3-5 days after you give birth. The following recommendations can help ease engorgement:  Completely empty your breasts while breastfeeding or pumping. You may want to start by applying warm, moist heat (in the shower or with warm water-soaked hand towels) just before feeding or pumping. This increases circulation and helps the milk flow. If your baby does not completely empty your breasts while breastfeeding, pump any extra milk after he or she is finished.  Wear a snug bra (nursing or regular) or tank top for 1-2 days to signal your body to slightly decrease milk production.  Apply ice packs to your breasts, unless this is too uncomfortable for you.  Make sure that your baby is latched on and positioned properly while breastfeeding. If engorgement persists after 48 hours of following these recommendations, contact your health care provider or a lactation consultant. OVERALL HEALTH CARE RECOMMENDATIONS WHILE BREASTFEEDING  Eat healthy foods. Alternate between meals and snacks, eating 3 of each per day. Because what you eat affects your breast milk, some of the foods may make your baby more irritable than usual. Avoid eating these foods if you are sure that they are negatively affecting your baby.  Drink milk, fruit juice, and water to satisfy your thirst (about 10 glasses a day).   Rest often, relax, and continue to take your prenatal vitamins to prevent fatigue, stress, and anemia.  Continue breast self-awareness checks.  Avoid chewing and smoking tobacco.  Avoid alcohol and drug use. Some medicines that may be harmful to your baby can pass through breast milk. It is important to ask your health care provider before taking any medicine, including all over-the-counter and prescription medicine as well as vitamin and herbal supplements. It is possible to become pregnant while breastfeeding. If birth control is desired, ask your health care provider about options that  will be safe for your baby. SEEK MEDICAL CARE IF:   You feel like you want to stop breastfeeding or have become frustrated with breastfeeding.  You have painful breasts or nipples.  Your nipples are cracked or bleeding.  Your breasts are red, tender, or warm.  You have a swollen area on either breast.  You have a fever or chills.  You have nausea or vomiting.  You have drainage other than breast milk from your nipples.  Your breasts do not become full before feedings by the fifth day after you give birth.  You feel sad and depressed.  Your baby is too sleepy to eat well.  Your baby is having trouble sleeping.   Your baby is wetting less than 3 diapers in a 24-hour period.  Your baby has less than 3 stools in a 24-hour period.  Your baby's skin or the white part of his or her eyes becomes yellow.   Your baby is not gaining weight by 5 days of age. SEEK IMMEDIATE MEDICAL CARE IF:   Your baby is overly tired (lethargic) and does not want to wake up and feed.  Your baby   develops an unexplained fever. Document Released: 12/24/2004 Document Revised: 12/29/2012 Document Reviewed: 06/17/2012 [employer] Patient Information 2015 [employer]. This information is not intended to replace advice given to you by your health care provider. Make sure you discuss any questions you have with your health care provider.  

## 2014-03-22 NOTE — Progress Notes (Signed)
Edema- feet, inner thigh  Pressure-lower abd Vaginal discharge- mucous like

## 2014-03-22 NOTE — Progress Notes (Signed)
F/u u/s for growth 28 wk labs next visit with [facility]

## 2014-03-30 ENCOUNTER — Ambulatory Visit (HOSPITAL_COMMUNITY)
Admission: RE | Admit: 2014-03-30 | Discharge: 2014-03-30 | Disposition: A | Discharge status: Home / Self Care | Payer: Medicaid Other | Source: Ambulatory Visit | Attending: Family Medicine | Admitting: Family Medicine

## 2014-03-30 DIAGNOSIS — O09292 Supervision of pregnancy with other poor reproductive or obstetric history, second trimester: Secondary | ICD-10-CM | POA: Insufficient documentation

## 2014-03-30 DIAGNOSIS — Z3A26 26 weeks gestation of pregnancy: Secondary | ICD-10-CM | POA: Insufficient documentation

## 2014-04-14 ENCOUNTER — Encounter: Payer: Self-pay | Admitting: Obstetrics and Gynecology

## 2014-04-14 ENCOUNTER — Ambulatory Visit (INDEPENDENT_AMBULATORY_CARE_PROVIDER_SITE_OTHER): Payer: Medicaid Other | Admitting: Obstetrics and Gynecology

## 2014-04-14 ENCOUNTER — Encounter: Payer: Medicaid Other | Admitting: Obstetrics and Gynecology

## 2014-04-14 VITALS — BP 128/76 | HR 107 | Wt 182.8 lb

## 2014-04-14 DIAGNOSIS — Z23 Encounter for immunization: Secondary | ICD-10-CM | POA: Diagnosis not present

## 2014-04-14 DIAGNOSIS — O0993 Supervision of high risk pregnancy, unspecified, third trimester: Secondary | ICD-10-CM | POA: Diagnosis not present

## 2014-04-14 DIAGNOSIS — Z8759 Personal history of other complications of pregnancy, childbirth and the puerperium: Secondary | ICD-10-CM

## 2014-04-14 DIAGNOSIS — O09293 Supervision of pregnancy with other poor reproductive or obstetric history, third trimester: Secondary | ICD-10-CM | POA: Diagnosis not present

## 2014-04-14 LAB — CBC
HEMATOCRIT: 36.1 % (ref 36.0–46.0)
HEMOGLOBIN: 12.4 g/dL (ref 12.0–15.0)
MCH: 33.1 pg (ref 26.0–34.0)
MCHC: 34.3 g/dL (ref 30.0–36.0)
MCV: 96.3 fL (ref 78.0–100.0)
MPV: 9.8 fL (ref 8.6–12.4)
Platelets: 193 10*3/uL (ref 150–400)
RBC: 3.75 MIL/uL — ABNORMAL LOW (ref 3.87–5.11)
RDW: 14.1 % (ref 11.5–15.5)
WBC: 9.7 10*3/uL (ref 4.0–10.5)

## 2014-04-14 LAB — POCT URINALYSIS DIP (DEVICE)
BILIRUBIN URINE: NEGATIVE
GLUCOSE, UA: NEGATIVE mg/dL
Hgb urine dipstick: NEGATIVE
KETONES UR: NEGATIVE mg/dL
Nitrite: NEGATIVE
Protein, ur: NEGATIVE mg/dL
Specific Gravity, Urine: 1.015 (ref 1.005–1.030)
Urobilinogen, UA: 0.2 mg/dL (ref 0.0–1.0)
pH: 6 (ref 5.0–8.0)

## 2014-04-14 MED ORDER — TETANUS-DIPHTH-ACELL PERTUSSIS 5-2.5-18.5 LF-MCG/0.5 IM SUSP
0.5000 mL | Freq: Once | INTRAMUSCULAR | Status: AC
  Administered 2014-04-14: 0.5 mL via INTRAMUSCULAR

## 2014-04-14 NOTE — Progress Notes (Signed)
Mod leuks in urine.

## 2014-04-14 NOTE — Progress Notes (Signed)
Patient is doing well without complaints. FM/PTL precautions reviewed. 1hr GCT and labs today. Follow up growth ultrasound ordered

## 2014-04-14 NOTE — Progress Notes (Signed)
Growth U/S 05/12/14 @ 1030a with Radiology.

## 2014-04-14 NOTE — Progress Notes (Signed)
Patient reports contractions a few times a day that can be painful at times

## 2014-04-15 LAB — GLUCOSE TOLERANCE, 1 HOUR (50G) W/O FASTING: GLUCOSE 1 HOUR GTT: 89 mg/dL (ref 70–140)

## 2014-04-15 LAB — HIV ANTIBODY (ROUTINE TESTING W REFLEX): HIV: NONREACTIVE

## 2014-04-15 LAB — RPR

## 2014-04-21 ENCOUNTER — Encounter (HOSPITAL_COMMUNITY): Payer: Self-pay | Admitting: *Deleted

## 2014-04-21 ENCOUNTER — Inpatient Hospital Stay (HOSPITAL_COMMUNITY)
Admission: AD | Admit: 2014-04-21 | Discharge: 2014-04-21 | Disposition: A | Discharge status: Home / Self Care | Payer: Medicaid Other | Source: Ambulatory Visit | Attending: Obstetrics & Gynecology | Admitting: Obstetrics & Gynecology

## 2014-04-21 DIAGNOSIS — O2693 Pregnancy related conditions, unspecified, third trimester: Secondary | ICD-10-CM

## 2014-04-21 DIAGNOSIS — O4703 False labor before 37 completed weeks of gestation, third trimester: Secondary | ICD-10-CM | POA: Diagnosis not present

## 2014-04-21 DIAGNOSIS — Z3A29 29 weeks gestation of pregnancy: Secondary | ICD-10-CM | POA: Diagnosis not present

## 2014-04-21 DIAGNOSIS — K5900 Constipation, unspecified: Secondary | ICD-10-CM | POA: Insufficient documentation

## 2014-04-21 DIAGNOSIS — O26893 Other specified pregnancy related conditions, third trimester: Secondary | ICD-10-CM

## 2014-04-21 DIAGNOSIS — Z87891 Personal history of nicotine dependence: Secondary | ICD-10-CM | POA: Diagnosis not present

## 2014-04-21 DIAGNOSIS — M545 Low back pain, unspecified: Secondary | ICD-10-CM

## 2014-04-21 LAB — URINALYSIS, ROUTINE W REFLEX MICROSCOPIC
Bilirubin Urine: NEGATIVE
GLUCOSE, UA: NEGATIVE mg/dL
Hgb urine dipstick: NEGATIVE
Ketones, ur: NEGATIVE mg/dL
Leukocytes, UA: NEGATIVE
Nitrite: NEGATIVE
Protein, ur: NEGATIVE mg/dL
SPECIFIC GRAVITY, URINE: 1.01 (ref 1.005–1.030)
Urobilinogen, UA: 0.2 mg/dL (ref 0.0–1.0)
pH: 7.5 (ref 5.0–8.0)

## 2014-04-21 LAB — WET PREP, GENITAL
Clue Cells Wet Prep HPF POC: NONE SEEN
Trich, Wet Prep: NONE SEEN
Yeast Wet Prep HPF POC: NONE SEEN

## 2014-04-21 LAB — FETAL FIBRONECTIN: FETAL FIBRONECTIN: NEGATIVE

## 2014-04-21 NOTE — MAU Provider Note (Signed)
History     CSN: [id number]  Arrival date and time: 04/21/14 7672  First Provider Initiated Contact with Patient 04/21/14 [phone]     Chief Complaint  Patient presents with  . Contractions   HPI  Patient is 24 y.o. [id number] [redacted]w[redacted]d here with complaints of pain in her back and abdomen. Patient reports started yesterday, went away in sleep but then returned this am.  Pain is in LB, described as achy back and cramping in the front.  Pain comes and goes.  Better with laying down.  Not taking medication for pain.  Felt lightheaded yesterday that has resolved.  Patient endorses drinking and eating well.  No fevers, chills, dysuria, hematuria.  Endorses straining but is able to move bowels.  +FM, denies LOF, VB, vaginal discharge.  OB History    Gravida Para Term Preterm AB TAB SAB Ectopic Multiple Living   4 3 2 1  0 0 0 0 0 2      Past Medical History  Diagnosis Date  . Thyroid disease   . Headache(784.0)   . Infection     UTI    Past Surgical History  Procedure Laterality Date  . Vaginal delivery      X 2  . Mouth surgery    . No past surgeries      Family History  Problem Relation Age of Onset  . Cancer Mother     breast to bone  . Hypertension Father   . Diabetes Maternal Grandmother   . Stroke Maternal Grandmother   . Heart disease Paternal Grandmother     History  Substance Use Topics  . Smoking status: Former Smoker    Types: Cigarettes  . Smokeless tobacco: Never Used     Comment: Aug 2015  . Alcohol Use: No    Allergies: No Known Allergies  Prescriptions prior to admission  Medication Sig Dispense Refill Last Dose  . acetaminophen (TYLENOL) 500 MG tablet Take 1,000 mg by mouth every 6 (six) hours as needed for mild pain or headache.   04/20/2014 at Unknown time  . calcium carbonate (TUMS - DOSED IN MG ELEMENTAL CALCIUM) 500 MG chewable tablet Chew 2 tablets by mouth 2 (two) times daily as needed for indigestion or heartburn.   04/20/2014 at Unknown time  .  prenatal vitamin w/FE, FA (PRENATAL 1 + 1) 27-1 MG TABS tablet Take 1 tablet by mouth daily at 12 noon.   04/20/2014 at Unknown time    Review of Systems  Constitutional: Negative for fever and chills.  Gastrointestinal: Positive for nausea, abdominal pain (mild cramping) and constipation. Negative for vomiting and diarrhea.  Genitourinary: Negative for dysuria, urgency, frequency, hematuria and flank pain.       Beg for VB or LOF  Musculoskeletal: Positive for back pain. Negative for myalgias.   Physical Exam   Blood pressure 116/64, pulse 97, temperature 98.4 F (36.9 C), temperature source Oral, resp. rate 18, not currently breastfeeding.  Physical Exam  Constitutional: She is oriented to person, place, and time. She appears well-developed and well-nourished. No distress.  HENT:  Head: Normocephalic and atraumatic.  Eyes: EOM are normal. No scleral icterus.  Neck: Normal range of motion. Neck supple.  Cardiovascular: Normal rate, regular rhythm and intact distal pulses.   Murmur (SEM on LSB) heard. Respiratory: Effort normal and breath sounds normal. No respiratory distress.  GI: Soft. Bowel sounds are normal. She exhibits no distension. There is no tenderness. There is no rebound and no guarding.  Musculoskeletal: She exhibits no edema or tenderness.  Neurological: She is alert and oriented to person, place, and time.  Skin: Skin is warm and dry. No rash noted.  Psychiatric: She has a normal mood and affect. Her behavior is normal.   Dilation: Closed Effacement (%): Thick Cervical Position: Posterior Exam by:: V Shayn Madole CNM   Fetal monitoringBaseline: 148 bpm, Variability: Good {> 6 bpm) and Accelerations: Reactive Uterine activity uterine irritation with sparse irregular contraction on TOCO  Results for orders placed or performed during the hospital encounter of 04/21/14 (from the past 24 hour(s))  Urinalysis, Routine w reflex microscopic     Status: None   Collection Time:  04/21/14  8:30 AM  Result Value Ref Range   Color, Urine YELLOW YELLOW   APPearance CLEAR CLEAR   Specific Gravity, Urine 1.010 1.005 - 1.030   pH 7.5 5.0 - 8.0   Glucose, UA NEGATIVE NEGATIVE mg/dL   Hgb urine dipstick NEGATIVE NEGATIVE   Bilirubin Urine NEGATIVE NEGATIVE   Ketones, ur NEGATIVE NEGATIVE mg/dL   Protein, ur NEGATIVE NEGATIVE mg/dL   Urobilinogen, UA 0.2 0.0 - 1.0 mg/dL   Nitrite NEGATIVE NEGATIVE   Leukocytes, UA NEGATIVE NEGATIVE  Wet prep, genital     Status: Abnormal   Collection Time: 04/21/14 10:05 AM  Result Value Ref Range   Yeast Wet Prep HPF POC NONE SEEN NONE SEEN   Trich, Wet Prep NONE SEEN NONE SEEN   Clue Cells Wet Prep HPF POC NONE SEEN NONE SEEN   WBC, Wet Prep HPF POC FEW (A) NONE SEEN  Fetal fibronectin     Status: None   Collection Time: 04/21/14 10:05 AM  Result Value Ref Range   Fetal Fibronectin NEGATIVE NEGATIVE    MAU Course  Procedures  MDM NST reactive Urine negative for dehydration or infection Fetal fibronectin NEGATIVE, wet prep negative, GC/CT obtained and is pending  Assessment and Plan  Likely braxton hicks vs constipation.  FFN NEGATIVE.  No evidence of infection.  Cervical exam closed and high.  NST reassuring.  Discussed return precautions.  Patient to follow up with OB next Thursday as scheduled. GC/CT pending.     Ronnie Doss M, DO 04/21/2014, 11:11 AM   I was present for the exam and agree with above.  [place], CNM 04/21/2014 1:26 PM

## 2014-04-21 NOTE — MAU Note (Signed)
Feels like contractions, started last night.  Stopped when laid down. nstarted again this morning, 3-4/hr.  No bleeding or leaking. No hx of PTL

## 2014-04-21 NOTE — Discharge Instructions (Signed)
Safe Medications in Pregnancy   Acne: Benzoyl Peroxide Salicylic Acid  Backache/Headache: Tylenol: 2 regular strength every 4 hours OR              2 Extra strength every 6 hours  Colds/Coughs/Allergies: Benadryl (alcohol free) 25 mg every 6 hours as needed Breath right strips Claritin Cepacol throat lozenges Chloraseptic throat spray Cold-Eeze- up to three times per day Cough drops, alcohol free Flonase (by prescription only) Guaifenesin Mucinex Robitussin DM (plain only, alcohol free) Saline nasal spray/drops Sudafed (pseudoephedrine) & Actifed ** use only after [redacted] weeks gestation and if you do not have high blood pressure Tylenol Vicks Vaporub Zinc lozenges Zyrtec   Constipation: Colace Ducolax suppositories Fleet enema Glycerin suppositories Metamucil Milk of magnesia Miralax Senokot Smooth move tea  Diarrhea: Kaopectate Imodium A-D  *NO pepto Bismol  Hemorrhoids: Anusol Anusol HC Preparation H Tucks  Indigestion: Tums Maalox Mylanta Zantac  Pepcid  Insomnia: Benadryl (alcohol free) 25mg  every 6 hours as needed Tylenol PM Unisom, no Gelcaps  Leg Cramps: Tums MagGel  Nausea/Vomiting:  Bonine Dramamine Emetrol Ginger extract Sea bands Meclizine  Nausea medication to take during pregnancy:  Unisom (doxylamine succinate 25 mg tablets) Take one tablet daily at bedtime. If symptoms are not adequately controlled, the dose can be increased to a maximum recommended dose of two tablets daily (1/2 tablet in the morning, 1/2 tablet mid-afternoon and one at bedtime). Vitamin B6 100mg  tablets. Take one tablet twice a day (up to 200 mg per day).  Skin Rashes: Aveeno products Benadryl cream or 25mg  every 6 hours as needed Calamine Lotion 1% cortisone cream  Yeast infection: Gyne-lotrimin 7 Monistat 7   **If taking multiple medications, please check labels to avoid duplicating the same active ingredients **take medication as directed on  the label ** Do not exceed 4000 mg of tylenol in 24 hours **Do not take medications that contain aspirin or ibuprofen  Braxton Hicks Contractions Contractions of the uterus can occur throughout pregnancy. Contractions are not always a sign that you are in labor.  WHAT ARE BRAXTON HICKS CONTRACTIONS?  Contractions that occur before labor are called Braxton Hicks contractions, or false labor. Toward the end of pregnancy (32-34 weeks), these contractions can develop more often and may become more forceful. This is not true labor because these contractions do not result in opening (dilatation) and thinning of the cervix. They are sometimes difficult to tell apart from true labor because these contractions can be forceful and people have different pain tolerances. You should not feel embarrassed if you go to the hospital with false labor. Sometimes, the only way to tell if you are in true labor is for your health care provider to look for changes in the cervix. If there are no prenatal problems or other health problems associated with the pregnancy, it is completely safe to be sent home with false labor and await the onset of true labor. HOW CAN YOU TELL THE DIFFERENCE BETWEEN TRUE AND FALSE LABOR? False Labor  The contractions of false labor are usually shorter and not as hard as those of true labor.   The contractions are usually irregular.   The contractions are often felt in the front of the lower abdomen and in the groin.   The contractions may go away when you walk around or change positions while lying down.   The contractions get weaker and are shorter lasting as time goes on.   The contractions do not usually become progressively stronger, regular, and  closer together as with true labor.  True Labor  Contractions in true labor last 30-70 seconds, become very regular, usually become more intense, and increase in frequency.   The contractions do not go away with walking.   The  discomfort is usually felt in the top of the uterus and spreads to the lower abdomen and low back.   True labor can be determined by your health care provider with an exam. This will show that the cervix is dilating and getting thinner.  WHAT TO REMEMBER  Keep up with your usual exercises and follow other instructions given by your health care provider.   Take medicines as directed by your health care provider.   Keep your regular prenatal appointments.   Eat and drink lightly if you think you are going into labor.   If Braxton Hicks contractions are making you uncomfortable:   Change your position from lying down or resting to walking, or from walking to resting.   Sit and rest in a tub of warm water.   Drink 2-3 glasses of water. Dehydration may cause these contractions.   Do slow and deep breathing several times an hour.  WHEN SHOULD I SEEK IMMEDIATE MEDICAL CARE? Seek immediate medical care if:  Your contractions become stronger, more regular, and closer together.   You have fluid leaking or gushing from your vagina.   You have a fever.   You pass blood-tinged mucus.   You have vaginal bleeding.   You have continuous abdominal pain.   You have low back pain that you never had before.   You feel your baby's head pushing down and causing pelvic pressure.   Your baby is not moving as much as it used to.  Document Released: 12/24/2004 Document Revised: 12/29/2012 Document Reviewed: 10/05/2012 [facility] Patient Information 2015 [place], [place]. This information is not intended to replace advice given to you by your health care provider. Make sure you discuss any questions you have with your health care provider.

## 2014-04-22 LAB — GC/CHLAMYDIA PROBE AMP (~~LOC~~) NOT AT ARMC
Chlamydia: NEGATIVE
Neisseria Gonorrhea: NEGATIVE

## 2014-04-28 ENCOUNTER — Ambulatory Visit (INDEPENDENT_AMBULATORY_CARE_PROVIDER_SITE_OTHER): Payer: Self-pay | Admitting: Obstetrics and Gynecology

## 2014-04-28 ENCOUNTER — Encounter: Payer: Self-pay | Admitting: Obstetrics and Gynecology

## 2014-04-28 VITALS — BP 125/67 | HR 111 | Wt 181.6 lb

## 2014-04-28 DIAGNOSIS — O0993 Supervision of high risk pregnancy, unspecified, third trimester: Secondary | ICD-10-CM

## 2014-04-28 LAB — POCT URINALYSIS DIP (DEVICE)
Bilirubin Urine: NEGATIVE
Glucose, UA: NEGATIVE mg/dL
Hgb urine dipstick: NEGATIVE
Ketones, ur: NEGATIVE mg/dL
Nitrite: NEGATIVE
PROTEIN: NEGATIVE mg/dL
SPECIFIC GRAVITY, URINE: 1.015 (ref 1.005–1.030)
UROBILINOGEN UA: 0.2 mg/dL (ref 0.0–1.0)
pH: 6.5 (ref 5.0–8.0)

## 2014-04-28 NOTE — Progress Notes (Signed)
Leukocytes: Small

## 2014-04-28 NOTE — Progress Notes (Signed)
Hx SB. No concerns. Good FM. Rare UC. For growth scan 05/12/14. Start fetal testing at 32 wks and deliver @39wks : Advised of POC. PTL and FM precautions.

## 2014-04-28 NOTE — Patient Instructions (Signed)
Third Trimester of Pregnancy The third trimester is from week 29 through week 42, months 7 through 9. The third trimester is a time when the fetus is growing rapidly. At the end of the ninth month, the fetus is about 20 inches in length and weighs 6-10 pounds.  BODY CHANGES Your body goes through many changes during pregnancy. The changes vary from woman to woman.   Your weight will continue to increase. You can expect to gain 25-35 pounds (11-16 kg) by the end of the pregnancy.  You may begin to get stretch marks on your hips, abdomen, and breasts.  You may urinate more often because the fetus is moving lower into your pelvis and pressing on your bladder.  You may develop or continue to have heartburn as a result of your pregnancy.  You may develop constipation because certain hormones are causing the muscles that push waste through your intestines to slow down.  You may develop hemorrhoids or swollen, bulging veins (varicose veins).  You may have pelvic pain because of the weight gain and pregnancy hormones relaxing your joints between the bones in your pelvis. Backaches may result from overexertion of the muscles supporting your posture.  You may have changes in your hair. These can include thickening of your hair, rapid growth, and changes in texture. Some women also have hair loss during or after pregnancy, or hair that feels dry or thin. Your hair will most likely return to normal after your baby is born.  Your breasts will continue to grow and be tender. A yellow discharge may leak from your breasts called colostrum.  Your belly button may stick out.  You may feel short of breath because of your expanding uterus.  You may notice the fetus "dropping," or moving lower in your abdomen.  You may have a bloody mucus discharge. This usually occurs a few days to a week before labor begins.  Your cervix becomes thin and soft (effaced) near your due date. WHAT TO EXPECT AT YOUR PRENATAL  EXAMS  You will have prenatal exams every 2 weeks until week 36. Then, you will have weekly prenatal exams. During a routine prenatal visit:  You will be weighed to make sure you and the fetus are growing normally.  Your blood pressure is taken.  Your abdomen will be measured to track your baby's growth.  The fetal heartbeat will be listened to.  Any test results from the previous visit will be discussed.  You may have a cervical check near your due date to see if you have effaced. At around 36 weeks, your caregiver will check your cervix. At the same time, your caregiver will also perform a test on the secretions of the vaginal tissue. This test is to determine if a type of bacteria, Group B streptococcus, is present. Your caregiver will explain this further. Your caregiver may ask you:  What your birth plan is.  How you are feeling.  If you are feeling the baby move.  If you have had any abnormal symptoms, such as leaking fluid, bleeding, severe headaches, or abdominal cramping.  If you have any questions. Other tests or screenings that may be performed during your third trimester include:  Blood tests that check for low iron levels (anemia).  Fetal testing to check the health, activity level, and growth of the fetus. Testing is done if you have certain medical conditions or if there are problems during the pregnancy. FALSE LABOR You may feel small, irregular contractions that   eventually go away. These are called Braxton Hicks contractions, or false labor. Contractions may last for hours, days, or even weeks before true labor sets in. If contractions come at regular intervals, intensify, or become painful, it is best to be seen by your caregiver.  SIGNS OF LABOR   Menstrual-like cramps.  Contractions that are 5 minutes apart or less.  Contractions that start on the top of the uterus and spread down to the lower abdomen and back.  A sense of increased pelvic pressure or back  pain.  A watery or bloody mucus discharge that comes from the vagina. If you have any of these signs before the 37th week of pregnancy, call your caregiver right away. You need to go to the hospital to get checked immediately. HOME CARE INSTRUCTIONS   Avoid all smoking, herbs, alcohol, and unprescribed drugs. These chemicals affect the formation and growth of the baby.  Follow your caregiver's instructions regarding medicine use. There are medicines that are either safe or unsafe to take during pregnancy.  Exercise only as directed by your caregiver. Experiencing uterine cramps is a good sign to stop exercising.  Continue to eat regular, healthy meals.  Wear a good support bra for breast tenderness.  Do not use hot tubs, steam rooms, or saunas.  Wear your seat belt at all times when driving.  Avoid raw meat, uncooked cheese, cat litter boxes, and soil used by cats. These carry germs that can cause birth defects in the baby.  Take your prenatal vitamins.  Try taking a stool softener (if your caregiver approves) if you develop constipation. Eat more high-fiber foods, such as fresh vegetables or fruit and whole grains. Drink plenty of fluids to keep your urine clear or pale yellow.  Take warm sitz baths to soothe any pain or discomfort caused by hemorrhoids. Use hemorrhoid cream if your caregiver approves.  If you develop varicose veins, wear support hose. Elevate your feet for 15 minutes, 3-4 times a day. Limit salt in your diet.  Avoid heavy lifting, wear low heal shoes, and practice good posture.  Rest a lot with your legs elevated if you have leg cramps or low back pain.  Visit your dentist if you have not gone during your pregnancy. Use a soft toothbrush to brush your teeth and be gentle when you floss.  A sexual relationship may be continued unless your caregiver directs you otherwise.  Do not travel far distances unless it is absolutely necessary and only with the approval  of your caregiver.  Take prenatal classes to understand, practice, and ask questions about the labor and delivery.  Make a trial run to the hospital.  Pack your hospital bag.  Prepare the baby's nursery.  Continue to go to all your prenatal visits as directed by your caregiver. SEEK MEDICAL CARE IF:  You are unsure if you are in labor or if your water has broken.  You have dizziness.  You have mild pelvic cramps, pelvic pressure, or nagging pain in your abdominal area.  You have persistent nausea, vomiting, or diarrhea.  You have a bad smelling vaginal discharge.  You have pain with urination. SEEK IMMEDIATE MEDICAL CARE IF:   You have a fever.  You are leaking fluid from your vagina.  You have spotting or bleeding from your vagina.  You have severe abdominal cramping or pain.  You have rapid weight loss or gain.  You have shortness of breath with chest pain.  You notice sudden or extreme swelling   of your face, hands, ankles, feet, or legs.  You have not felt your baby move in over an hour.  You have severe headaches that do not go away with medicine.  You have vision changes. Document Released: 12/18/2000 Document Revised: 12/29/2012 Document Reviewed: 02/25/2012 [employer] Patient Information 2015 [employer]. This information is not intended to replace advice given to you by your health care provider. Make sure you discuss any questions you have with your health care provider.  

## 2014-05-12 ENCOUNTER — Ambulatory Visit (INDEPENDENT_AMBULATORY_CARE_PROVIDER_SITE_OTHER): Payer: Medicaid Other | Admitting: Family

## 2014-05-12 ENCOUNTER — Ambulatory Visit (HOSPITAL_COMMUNITY)
Admission: RE | Admit: 2014-05-12 | Discharge: 2014-05-12 | Disposition: A | Discharge status: Home / Self Care | Payer: Medicaid Other | Source: Ambulatory Visit | Attending: Obstetrics and Gynecology | Admitting: Obstetrics and Gynecology

## 2014-05-12 ENCOUNTER — Ambulatory Visit (HOSPITAL_COMMUNITY): Payer: Self-pay

## 2014-05-12 VITALS — BP 133/65 | HR 98 | Temp 97.8°F | Wt 183.1 lb

## 2014-05-12 DIAGNOSIS — O09293 Supervision of pregnancy with other poor reproductive or obstetric history, third trimester: Secondary | ICD-10-CM | POA: Insufficient documentation

## 2014-05-12 DIAGNOSIS — Z3A32 32 weeks gestation of pregnancy: Secondary | ICD-10-CM | POA: Diagnosis not present

## 2014-05-12 DIAGNOSIS — Z36 Encounter for antenatal screening of mother: Secondary | ICD-10-CM | POA: Diagnosis not present

## 2014-05-12 DIAGNOSIS — O0993 Supervision of high risk pregnancy, unspecified, third trimester: Secondary | ICD-10-CM

## 2014-05-12 DIAGNOSIS — Z3493 Encounter for supervision of normal pregnancy, unspecified, third trimester: Secondary | ICD-10-CM

## 2014-05-12 LAB — POCT URINALYSIS DIP (DEVICE)
Bilirubin Urine: NEGATIVE
Glucose, UA: NEGATIVE mg/dL
Hgb urine dipstick: NEGATIVE
KETONES UR: 15 mg/dL — AB
Nitrite: NEGATIVE
Protein, ur: NEGATIVE mg/dL
Specific Gravity, Urine: 1.015 (ref 1.005–1.030)
UROBILINOGEN UA: 0.2 mg/dL (ref 0.0–1.0)
pH: 7 (ref 5.0–8.0)

## 2014-05-12 NOTE — Progress Notes (Signed)
Pain- ligament  Pressure- lower abd

## 2014-05-12 NOTE — Addendum Note (Signed)
Addended by: Shelly Coss on: 05/12/2014 11:38 AM   Modules accepted: Orders, Level of Service

## 2014-05-12 NOTE — Addendum Note (Signed)
Addended by: Shelly Coss on: 05/12/2014 11:51 AM   Modules accepted: Level of Service

## 2014-05-12 NOTE — Progress Notes (Signed)
Growth ultrasound today.  Final report not available.  Feeling round ligament pain.  Denies vaginal bleeding or leaking of fluid.  Begin NST today for history of fetal demise.  Works Dentist at [employer].

## 2014-05-12 NOTE — Progress Notes (Signed)
Ketones: 15, Leukocytes: Small

## 2014-05-16 ENCOUNTER — Ambulatory Visit (INDEPENDENT_AMBULATORY_CARE_PROVIDER_SITE_OTHER): Payer: Medicaid Other | Admitting: *Deleted

## 2014-05-16 VITALS — BP 122/62 | HR 99

## 2014-05-16 DIAGNOSIS — O09293 Supervision of pregnancy with other poor reproductive or obstetric history, third trimester: Secondary | ICD-10-CM

## 2014-05-17 NOTE — Progress Notes (Signed)
05/16/14 NST reviewed and reactive

## 2014-05-19 ENCOUNTER — Ambulatory Visit (INDEPENDENT_AMBULATORY_CARE_PROVIDER_SITE_OTHER): Payer: Medicaid Other | Admitting: *Deleted

## 2014-05-19 VITALS — BP 114/61 | HR 89

## 2014-05-19 DIAGNOSIS — O09293 Supervision of pregnancy with other poor reproductive or obstetric history, third trimester: Secondary | ICD-10-CM | POA: Diagnosis not present

## 2014-05-19 NOTE — Progress Notes (Signed)
NST performed today was reviewed and was found to be reactive.  AFI normal at 16.9 cm.  Continue recommended antenatal testing and prenatal care.

## 2014-05-23 ENCOUNTER — Ambulatory Visit (INDEPENDENT_AMBULATORY_CARE_PROVIDER_SITE_OTHER): Payer: Medicaid Other | Admitting: Obstetrics & Gynecology

## 2014-05-23 ENCOUNTER — Inpatient Hospital Stay (HOSPITAL_COMMUNITY)
Admission: AD | Admit: 2014-05-23 | Discharge: 2014-05-24 | DRG: 778 | Disposition: A | Discharge status: Home / Self Care | Payer: Medicaid Other | Source: Ambulatory Visit | Attending: Family Medicine | Admitting: Family Medicine

## 2014-05-23 ENCOUNTER — Encounter (HOSPITAL_COMMUNITY): Payer: Self-pay | Admitting: *Deleted

## 2014-05-23 VITALS — BP 126/63 | HR 109

## 2014-05-23 DIAGNOSIS — O09293 Supervision of pregnancy with other poor reproductive or obstetric history, third trimester: Secondary | ICD-10-CM

## 2014-05-23 DIAGNOSIS — Z3A33 33 weeks gestation of pregnancy: Secondary | ICD-10-CM | POA: Diagnosis present

## 2014-05-23 DIAGNOSIS — O99283 Endocrine, nutritional and metabolic diseases complicating pregnancy, third trimester: Secondary | ICD-10-CM | POA: Diagnosis not present

## 2014-05-23 DIAGNOSIS — E079 Disorder of thyroid, unspecified: Secondary | ICD-10-CM | POA: Diagnosis not present

## 2014-05-23 DIAGNOSIS — Z87891 Personal history of nicotine dependence: Secondary | ICD-10-CM

## 2014-05-23 LAB — URINE MICROSCOPIC-ADD ON

## 2014-05-23 LAB — CBC
HEMATOCRIT: 34.9 % — AB (ref 36.0–46.0)
Hemoglobin: 11.9 g/dL — ABNORMAL LOW (ref 12.0–15.0)
MCH: 33 pg (ref 26.0–34.0)
MCHC: 34.1 g/dL (ref 30.0–36.0)
MCV: 96.7 fL (ref 78.0–100.0)
Platelets: 185 10*3/uL (ref 150–400)
RBC: 3.61 MIL/uL — ABNORMAL LOW (ref 3.87–5.11)
RDW: 14.3 % (ref 11.5–15.5)
WBC: 8.7 10*3/uL (ref 4.0–10.5)

## 2014-05-23 LAB — URINALYSIS, ROUTINE W REFLEX MICROSCOPIC
BILIRUBIN URINE: NEGATIVE
GLUCOSE, UA: NEGATIVE mg/dL
HGB URINE DIPSTICK: NEGATIVE
KETONES UR: 40 mg/dL — AB
NITRITE: NEGATIVE
Protein, ur: NEGATIVE mg/dL
Urobilinogen, UA: 0.2 mg/dL (ref 0.0–1.0)
pH: 7.5 (ref 5.0–8.0)

## 2014-05-23 LAB — RAPID URINE DRUG SCREEN, HOSP PERFORMED
AMPHETAMINES: NOT DETECTED
Barbiturates: NOT DETECTED
Benzodiazepines: NOT DETECTED
Cocaine: NOT DETECTED
OPIATES: NOT DETECTED
Tetrahydrocannabinol: NOT DETECTED

## 2014-05-23 MED ORDER — NIFEDIPINE 10 MG PO CAPS
10.0000 mg | ORAL_CAPSULE | ORAL | Status: AC
  Administered 2014-05-23: 10 mg via ORAL

## 2014-05-23 MED ORDER — NIFEDIPINE 10 MG PO CAPS
10.0000 mg | ORAL_CAPSULE | ORAL | Status: AC
  Administered 2014-05-23: 10 mg via ORAL
  Filled 2014-05-23: qty 1

## 2014-05-23 MED ORDER — DOCUSATE SODIUM 100 MG PO CAPS
100.0000 mg | ORAL_CAPSULE | Freq: Every day | ORAL | Status: DC
  Administered 2014-05-24: 100 mg via ORAL
  Filled 2014-05-23: qty 1

## 2014-05-23 MED ORDER — LACTATED RINGERS IV SOLN
INTRAVENOUS | Status: DC
  Administered 2014-05-23 – 2014-05-24 (×3): via INTRAVENOUS

## 2014-05-23 MED ORDER — PRENATAL MULTIVITAMIN CH
1.0000 | ORAL_TABLET | Freq: Every day | ORAL | Status: DC
  Administered 2014-05-24: 1 via ORAL
  Filled 2014-05-23: qty 1

## 2014-05-23 MED ORDER — CALCIUM CARBONATE ANTACID 500 MG PO CHEW
2.0000 | CHEWABLE_TABLET | ORAL | Status: DC | PRN
  Administered 2014-05-23: 400 mg via ORAL
  Filled 2014-05-23: qty 2

## 2014-05-23 MED ORDER — ACETAMINOPHEN 325 MG PO TABS: 650.0000 mg | ORAL_TABLET | ORAL | Status: DC | PRN

## 2014-05-23 MED ORDER — LACTATED RINGERS IV SOLN
2.0000 g/h | INTRAVENOUS | Status: DC
  Filled 2014-05-23: qty 80

## 2014-05-23 MED ORDER — DEXTROSE IN LACTATED RINGERS 5 % IV SOLN: INTRAVENOUS | Status: DC

## 2014-05-23 MED ORDER — ACETAMINOPHEN 500 MG PO TABS: 1000.0000 mg | ORAL_TABLET | Freq: Four times a day (QID) | ORAL | Status: DC | PRN

## 2014-05-23 MED ORDER — MAGNESIUM SULFATE BOLUS VIA INFUSION
4.0000 g | Freq: Once | INTRAVENOUS | Status: DC
  Filled 2014-05-23: qty 500

## 2014-05-23 MED ORDER — CALCIUM CARBONATE ANTACID 500 MG PO CHEW: 2.0000 | CHEWABLE_TABLET | Freq: Two times a day (BID) | ORAL | Status: DC | PRN

## 2014-05-23 MED ORDER — NIFEDIPINE 10 MG PO CAPS
10.0000 mg | ORAL_CAPSULE | Freq: Four times a day (QID) | ORAL | Status: DC
  Administered 2014-05-23: 10 mg via ORAL
  Filled 2014-05-23 (×3): qty 1

## 2014-05-23 MED ORDER — ZOLPIDEM TARTRATE 5 MG PO TABS
5.0000 mg | ORAL_TABLET | Freq: Every evening | ORAL | Status: DC | PRN
  Administered 2014-05-23: 5 mg via ORAL
  Filled 2014-05-23: qty 1

## 2014-05-23 MED ORDER — NIFEDIPINE 10 MG PO CAPS
20.0000 mg | ORAL_CAPSULE | Freq: Once | ORAL | Status: AC
  Administered 2014-05-23: 20 mg via ORAL
  Filled 2014-05-23: qty 2

## 2014-05-23 MED ORDER — BETAMETHASONE SOD PHOS & ACET 6 (3-3) MG/ML IJ SUSP
12.0000 mg | INTRAMUSCULAR | Status: AC
  Administered 2014-05-23 – 2014-05-24 (×2): 12 mg via INTRAMUSCULAR
  Filled 2014-05-23 (×2): qty 2

## 2014-05-23 MED ORDER — PRENATAL PLUS 27-1 MG PO TABS: 1.0000 | ORAL_TABLET | Freq: Every day | ORAL | Status: DC

## 2014-05-23 NOTE — Progress Notes (Signed)
NST performed today was reviewed and was found to be reactive; done due to history of stillbirth at 4 weeks.  Contractions q3-4 mins noted on tocometer. Patient reports increased pelvic pressure, difficulty walking and back pain today.  On exam, she was noted to be 3/50/-3/vertex.  Intact membranes. Patient will be admitted for preterm labor. Antenatal RN in charge, and on call team notified.

## 2014-05-23 NOTE — H&P (Signed)
FACULTY PRACTICE ANTEPARTUM ADMISSION HISTORY AND PHYSICAL NOTE   History of Present Illness: Dana Gonzalez is a 24 y.o. [phone] at [redacted]w[redacted]d admitted for Preterm labor.    Pt reports that starting today around 12pm, she noted strong pelvic pains, occuring about every 5-7 min apart. She reports that she has also had worsening pelvic pressure and low back pain.   Presented to [facility] for scheduled NST for h/o IUFD and was found to be 3/50%/-3 and thus sent to antepartum for admission.   Patient reports the fetal movement as active. Patient reports uterine contraction  activity as regular, every 5-7 minutes. Patient reports  vaginal bleeding as none. Patient describes fluid per vagina as None. Fetal presentation is cephalic.  Patient Active Problem List   Diagnosis Date Noted  . Prior pregnancy with fetal demise and current pregnancy   . Supervision of high risk pregnancy, antepartum 12/24/2013  . History of stillbirth 01/25/2013     Past Medical History  Diagnosis Date  . Thyroid disease   . Headache(784.0)   . Infection     UTI     Past Surgical History  Procedure Laterality Date  . Vaginal delivery      X 2  . Mouth surgery    . No past surgeries       OB History    Gravida Para Term Preterm AB TAB SAB Ectopic Multiple Living   4 3 2 1  0 0 0 0 0 2      History   Social History  . Marital Status: Single    Spouse Name: N/A  . Number of Children: N/A  . Years of Education: N/A   Social History Main Topics  . Smoking status: Former Smoker    Types: Cigarettes  . Smokeless tobacco: Never Used     Comment: Aug 2015  . Alcohol Use: No  . Drug Use: No     Comment: not since age 31  . Sexual Activity: Yes   Other Topics Concern  . Not on file   Social History Narrative    Family History  Problem Relation Age of Onset  . Cancer Mother     breast to bone  . Hypertension Father   . Diabetes Maternal Grandmother   . Stroke Maternal Grandmother   . Heart  disease Paternal Grandmother     No Known Allergies  Prescriptions prior to admission  Medication Sig Dispense Refill Last Dose  . acetaminophen (TYLENOL) 500 MG tablet Take 1,000 mg by mouth every 6 (six) hours as needed for mild pain or headache.   Taking  . calcium carbonate (TUMS - DOSED IN MG ELEMENTAL CALCIUM) 500 MG chewable tablet Chew 2 tablets by mouth 2 (two) times daily as needed for indigestion or heartburn.   Taking  . prenatal vitamin w/FE, FA (PRENATAL 1 + 1) 27-1 MG TABS tablet Take 1 tablet by mouth daily at 12 noon.   Taking     Prior to Admission:  Prescriptions prior to admission  Medication Sig Dispense Refill Last Dose  . acetaminophen (TYLENOL) 500 MG tablet Take 1,000 mg by mouth every 6 (six) hours as needed for mild pain or headache.   05/22/2014 at Unknown time  . calcium carbonate (TUMS - DOSED IN MG ELEMENTAL CALCIUM) 500 MG chewable tablet Chew 2 tablets by mouth 2 (two) times daily as needed for indigestion or heartburn.   05/22/2014 at Unknown time  . prenatal vitamin w/FE, FA (PRENATAL 1 + 1) 27-1  MG TABS tablet Take 1 tablet by mouth daily at 12 noon.   05/22/2014 at Unknown time    Review of Systems - History obtained from patient General ROS: negative for - chills, fatigue or fever Psychological ROS: negative Hematological and Lymphatic ROS: negative Respiratory ROS: no cough, shortness of breath, or wheezing Cardiovascular ROS: no chest pain or dyspnea on exertion Gastrointestinal ROS: no abdominal pain, change in bowel habits, or black or bloody stools Genito-Urinary ROS: no dysuria, trouble voiding, or hematuria Dermatological ROS: negative  Vitals:  LMP  (LMP Unknown) Physical Examination:  General appearance - alert, well appearing, and in no distress Chest - clear to auscultation, no wheezes, rales or rhonchi, symmetric air entry Heart - normal rate, regular rhythm, normal S1, S2, no murmurs, rubs, clicks or gallops Abdomen - soft,  nontender, nondistended, no masses or organomegaly Abdomen: gravid and fundal height  is size equals dates Pelvic Exam:reported by Dr. Domenica Reamer who checked pt in clinic Cervix: Evaluated by digital exam., Position: mid position, Dilation: 3cm, Thickness: 50% and Consistency: soft/-3 and fetal presentation is cephalic. Extremities: extremities normal, atraumatic, no cyanosis or edema with DTRs 2+ bilaterally Membranes:intact Fetal Monitoring:Baseline: 150 bpm, Variability: Good {> 6 bpm), Accelerations: Reactive and Decelerations: Absent TOCO: q 5-7 min  Labs:  No results found for this or any previous visit (from the past 24 hour(s)).  Imaging Studies: US Ob Follow Up  05/12/2014   OBSTETRICAL ULTRASOUND: This exam was performed within a [facility]. The OB US report was generated in the AS system, and faxed to the ordering physician.   This report is available in the [facility]. See the AS Obstetric US report via the Image Link.    Assessment and Plan: Patient Active Problem List   Diagnosis Date Noted  . Prior pregnancy with fetal demise and current pregnancy   . Supervision of high risk pregnancy, antepartum 12/24/2013  . History of stillbirth 01/25/2013   #) Preterm Labor - Pt with contractions since 12pm on 05/23/14. In clinic 3/50/-3 with contractions q 5-7 minutes. - Start procardia for tocolysis --> 20 mg PO loading dose, followed by 10 mg q 6 hours - IVF with D5LR @ 125 cc/hr - Confirmed VTX on BSUS - Betamethasone q 24 hours for fetal lung maturity - Utox - GBS obtained - Infectious w/up with UCx, CBC, GC/Ct, Wet Prep  #) FWB - Cat I, reassuring - Betamethasone as above - Continuous toco, continuous fetal monitoring - VTX per [place]  - NICU C/S  #) ID - GBS pending; If progresses in labor, PCN to dose  #)  Contraception - BTL vs LARC  Josephine Cables, MD OB fellow [facility], [place]

## 2014-05-23 NOTE — Plan of Care (Signed)
Problem: Consults Goal: Birthing Suites Patient Information Press F2 to bring up selections list   Pt < [redacted] weeks EGA     

## 2014-05-23 NOTE — Consult Note (Signed)
Asked by Dr.Pratt to provide prenatal consultation for patient at risk for preterm delivery due to preterm labor.  Mother is 24 y.o. G4 P2-1-0-2 who is now 31 5/[redacted] weeks EGA.  Had onset labor pains today and was seen in clinic where she was found to be 3 cm dilated.  She was admitted for tocolysis (Procardia) and has been given betamethasone today.   Discussed usual expectations and procedures for preterm infant at 70 - [redacted] weeks gestation, including NICU admission, possible needs for DR resuscitation, respiratory or temp support, IV access, and possible length of stay in NICU until 36 - [redacted] wks EGA.  Discussed advantages of feeding with mother's milk.  She plans to pump postnatally.  Patient and father of baby were attentive, had appropriate questions, and were appreciative of my input.  Thank you for the consultation.  Total time 25 Face-to-face time 20

## 2014-05-23 NOTE — Patient Instructions (Signed)
Return to clinic for any obstetric concerns or go to MAU for evaluation  

## 2014-05-24 LAB — CULTURE, BETA STREP (GROUP B ONLY)

## 2014-05-24 LAB — URINE CULTURE
CULTURE: NO GROWTH
Colony Count: NO GROWTH

## 2014-05-24 LAB — OB RESULTS CONSOLE GC/CHLAMYDIA: GC PROBE AMP, GENITAL: NEGATIVE

## 2014-05-24 LAB — HIV ANTIBODY (ROUTINE TESTING W REFLEX): HIV SCREEN 4TH GENERATION: NONREACTIVE

## 2014-05-24 LAB — OB RESULTS CONSOLE GBS: GBS: POSITIVE

## 2014-05-24 MED ORDER — NIFEDIPINE ER OSMOTIC RELEASE 30 MG PO TB24
30.0000 mg | ORAL_TABLET | Freq: Two times a day (BID) | ORAL | Status: DC
  Administered 2014-05-24 (×2): 30 mg via ORAL
  Filled 2014-05-24 (×2): qty 1

## 2014-05-24 MED ORDER — PROMETHAZINE HCL 25 MG/ML IJ SOLN
12.5000 mg | Freq: Once | INTRAMUSCULAR | Status: AC
  Administered 2014-05-24: 12.5 mg via INTRAVENOUS
  Filled 2014-05-24: qty 1

## 2014-05-24 MED ORDER — NIFEDIPINE ER 30 MG PO TB24: 30.0000 mg | ORAL_TABLET | Freq: Two times a day (BID) | ORAL | Status: DC

## 2014-05-24 MED ORDER — FENTANYL CITRATE (PF) 100 MCG/2ML IJ SOLN
100.0000 ug | Freq: Once | INTRAMUSCULAR | Status: AC
  Administered 2014-05-24: 100 ug via INTRAVENOUS
  Filled 2014-05-24: qty 2

## 2014-05-24 NOTE — Progress Notes (Signed)
TC to Hansel Feinstein CNM to report uterine contractions 2-3 min apart and pt feeling them.  Order for Procardia 10 mg PO received.

## 2014-05-24 NOTE — Discharge Instructions (Signed)

## 2014-05-24 NOTE — Discharge Summary (Signed)
Physician Discharge Summary  Patient ID: Dana Gonzalez MRN: [id number] DOB/AGE: 04/18/1990 23 y.o.  Admit date: 05/23/2014 Discharge date: 05/24/2014  Admission Diagnoses:  Discharge Diagnoses:  Active Problems:   Preterm labor   Discharged Condition: good  Hospital Course: Admitted in preterm labor, dilated 3/50/-3 with contractions 5-24min apart noted on NST for hx of IUFD.  Pt received tocolysis with procardia, BMZ x 2 and GBS cx done.  Cervical exam this morning by Dr. Kennon Rounds unchanged.  Will discharge with rx for procardia x 1 week, although technically only requires for 1 more day.  Consults: None  Significant Diagnostic Studies: labs: UDS neg, G/C pending  Treatments: IV hydration and procardia  Discharge Exam: Blood pressure 114/64, pulse 121, temperature 98 F (36.7 C), temperature source Oral, resp. rate 18, height 5\' 6"  (1.676 m), weight 185 lb (83.915 kg), SpO2 98 %, not currently breastfeeding. General appearance: alert and cooperative GI: soft, non-tender; bowel sounds normal; no masses,  no organomegaly Extremities: extremities normal, atraumatic, no cyanosis or edema Pulses: 2+ and symmetric  Disposition: 01-Home or Self Care  Discharge Instructions    Diet - low sodium heart healthy    Complete by:  As directed      Increase activity slowly    Complete by:  As directed             Medication List    TAKE these medications        acetaminophen 500 MG tablet  Commonly known as:  TYLENOL  Take 1,000 mg by mouth every 6 (six) hours as needed for mild pain or headache.     calcium carbonate 500 MG chewable tablet  Commonly known as:  TUMS - dosed in mg elemental calcium  Chew 2 tablets by mouth 2 (two) times daily as needed for indigestion or heartburn.     NIFEdipine 30 MG 24 hr tablet  Commonly known as:  PROCARDIA-XL/ADALAT CC  Take 1 tablet (30 mg total) by mouth 2 (two) times daily.      ASK your doctor about these medications        prenatal vitamin w/FE, FA 27-1 MG Tabs tablet  Take 1 tablet by mouth daily at 12 noon.           Follow-up Information    Follow up with [facility] In 1 week.   Specialty:  Obstetrics and Gynecology   Why:  OB check up   Contact information:   [place] [address] [phone]      Signed: Merla Riches 05/24/2014, 5:24 PM

## 2014-05-24 NOTE — Progress Notes (Signed)
Patient ID: Dana Gonzalez, female   DOB: August 13, 1990, 24 y.o.   MRN: [id number] [place]) NOTE  Dana Gonzalez is a 24 y.o. [id number] at [redacted]w[redacted]d by best clinical estimate who is admitted for Preterm labor.   Fetal presentation is cephalic. Length of Stay:  1  Days  Subjective: Complains of continued contractions despite procardia. Patient reports the fetal movement as active. Patient reports uterine contraction  activity as regular, every 5-6 minutes. Patient reports  vaginal bleeding as none. Patient describes fluid per vagina as None.  Vitals:  Blood pressure 117/55, pulse 125, temperature 98 F (36.7 C), temperature source Oral, resp. rate 20, height 5\' 6"  (1.676 m), weight 185 lb (83.915 kg), SpO2 98 %, not currently breastfeeding. Physical Examination:  General appearance - alert, well appearing, and in no distress Abdomen - gravid, NT Fundal Height:  size equals dates Cervical Exam: Position: posterior, Dilation: 3cm, Thickness: 1 cm and Consistency: soft and found to be 3 cm/ 50%/-3 and fetal presentation is cephalic. Extremities: extremities normal, atraumatic, no cyanosis or edema  Membranes:intact  Fetal Monitoring:  Baseline: 150 bpm, Variability: Good {> 6 bpm), Accelerations: Reactive and Decelerations: Absent  Labs:  Results for orders placed or performed during the hospital encounter of 05/23/14 (from the past 24 hour(s))  CBC on admission   Collection Time: 05/23/14  5:40 PM  Result Value Ref Range   WBC 8.7 4.0 - 10.5 K/uL   RBC 3.61 (L) 3.87 - 5.11 MIL/uL   Hemoglobin 11.9 (L) 12.0 - 15.0 g/dL   HCT 34.9 (L) 36.0 - 46.0 %   MCV 96.7 78.0 - 100.0 fL   MCH 33.0 26.0 - 34.0 pg   MCHC 34.1 30.0 - 36.0 g/dL   RDW 14.3 11.5 - 15.5 %   Platelets 185 150 - 400 K/uL  Urinalysis, Routine w reflex microscopic   Collection Time: 05/23/14  8:14 PM  Result Value Ref Range   Color, Urine YELLOW YELLOW   APPearance CLEAR CLEAR   Specific  Gravity, Urine <1.005 (L) 1.005 - 1.030   pH 7.5 5.0 - 8.0   Glucose, UA NEGATIVE NEGATIVE mg/dL   Hgb urine dipstick NEGATIVE NEGATIVE   Bilirubin Urine NEGATIVE NEGATIVE   Ketones, ur 40 (A) NEGATIVE mg/dL   Protein, ur NEGATIVE NEGATIVE mg/dL   Urobilinogen, UA 0.2 0.0 - 1.0 mg/dL   Nitrite NEGATIVE NEGATIVE   Leukocytes, UA TRACE (A) NEGATIVE  Urine rapid drug screen (hosp performed)   Collection Time: 05/23/14  8:14 PM  Result Value Ref Range   Opiates NONE DETECTED NONE DETECTED   Cocaine NONE DETECTED NONE DETECTED   Benzodiazepines NONE DETECTED NONE DETECTED   Amphetamines NONE DETECTED NONE DETECTED   Tetrahydrocannabinol NONE DETECTED NONE DETECTED   Barbiturates NONE DETECTED NONE DETECTED  Urine microscopic-add on   Collection Time: 05/23/14  8:14 PM  Result Value Ref Range   Squamous Epithelial / LPF RARE RARE   WBC, UA 0-2 <3 WBC/hpf   RBC / HPF 0-2 <3 RBC/hpf   Bacteria, UA RARE RARE    Medications:  Scheduled . betamethasone acetate-betamethasone sodium phosphate  12 mg Intramuscular Q24H  . docusate sodium  100 mg Oral Daily  . NIFEdipine  30 mg Oral BID  . prenatal multivitamin  1 tablet Oral Q1200   I have reviewed the patient's current medications.  ASSESSMENT: Patient Active Problem List   Diagnosis Date Noted  . Supervision of high risk pregnancy, antepartum 12/24/2013    Priority:  High  . Preterm labor 05/23/2014  . Prior pregnancy with fetal demise and current pregnancy   . History of stillbirth 01/25/2013    PLAN: Change to Procardia XL - given no cervical change--may d/c home after second BMZ.  Donnamae Jude, MD 05/24/2014,6:37 AM

## 2014-05-25 ENCOUNTER — Encounter: Payer: Self-pay | Admitting: Family Medicine

## 2014-05-25 DIAGNOSIS — O99820 Streptococcus B carrier state complicating pregnancy: Secondary | ICD-10-CM | POA: Insufficient documentation

## 2014-05-25 LAB — GC/CHLAMYDIA PROBE AMP (~~LOC~~) NOT AT ARMC
Chlamydia: NEGATIVE
NEISSERIA GONORRHEA: NEGATIVE

## 2014-05-26 ENCOUNTER — Inpatient Hospital Stay (HOSPITAL_COMMUNITY)
Admission: AD | Admit: 2014-05-26 | Discharge: 2014-05-26 | Disposition: A | Discharge status: Home / Self Care | Payer: Medicaid Other | Source: Ambulatory Visit | Attending: Obstetrics & Gynecology | Admitting: Obstetrics & Gynecology

## 2014-05-26 ENCOUNTER — Encounter: Payer: Self-pay | Admitting: Family Medicine

## 2014-05-26 ENCOUNTER — Encounter (HOSPITAL_COMMUNITY): Payer: Self-pay | Admitting: *Deleted

## 2014-05-26 ENCOUNTER — Ambulatory Visit (INDEPENDENT_AMBULATORY_CARE_PROVIDER_SITE_OTHER): Payer: Medicaid Other | Admitting: Family Medicine

## 2014-05-26 VITALS — BP 125/67 | HR 104 | Wt 183.0 lb

## 2014-05-26 DIAGNOSIS — R109 Unspecified abdominal pain: Secondary | ICD-10-CM | POA: Diagnosis present

## 2014-05-26 DIAGNOSIS — M545 Low back pain, unspecified: Secondary | ICD-10-CM

## 2014-05-26 DIAGNOSIS — O0993 Supervision of high risk pregnancy, unspecified, third trimester: Secondary | ICD-10-CM | POA: Diagnosis not present

## 2014-05-26 DIAGNOSIS — Z3A34 34 weeks gestation of pregnancy: Secondary | ICD-10-CM | POA: Insufficient documentation

## 2014-05-26 DIAGNOSIS — R1030 Lower abdominal pain, unspecified: Secondary | ICD-10-CM | POA: Diagnosis not present

## 2014-05-26 DIAGNOSIS — Z87891 Personal history of nicotine dependence: Secondary | ICD-10-CM | POA: Insufficient documentation

## 2014-05-26 DIAGNOSIS — O9989 Other specified diseases and conditions complicating pregnancy, childbirth and the puerperium: Secondary | ICD-10-CM

## 2014-05-26 DIAGNOSIS — O26893 Other specified pregnancy related conditions, third trimester: Secondary | ICD-10-CM

## 2014-05-26 DIAGNOSIS — O09293 Supervision of pregnancy with other poor reproductive or obstetric history, third trimester: Secondary | ICD-10-CM | POA: Diagnosis not present

## 2014-05-26 DIAGNOSIS — O26899 Other specified pregnancy related conditions, unspecified trimester: Secondary | ICD-10-CM

## 2014-05-26 DIAGNOSIS — O99820 Streptococcus B carrier state complicating pregnancy: Secondary | ICD-10-CM

## 2014-05-26 LAB — POCT URINALYSIS DIP (DEVICE)
BILIRUBIN URINE: NEGATIVE
GLUCOSE, UA: NEGATIVE mg/dL
Hgb urine dipstick: NEGATIVE
Ketones, ur: NEGATIVE mg/dL
NITRITE: NEGATIVE
Protein, ur: NEGATIVE mg/dL
Specific Gravity, Urine: 1.015 (ref 1.005–1.030)
Urobilinogen, UA: 0.2 mg/dL (ref 0.0–1.0)
pH: 7.5 (ref 5.0–8.0)

## 2014-05-26 NOTE — MAU Provider Note (Signed)
MAU Provider Note   Chief Complaint:  Abdominal cramping ?contractions and Low back pain  Dana Gonzalez is  24 y.o. [phone] at [redacted]w[redacted]d presents complaining of returned lower abd / pelvic cramping today, she is concerned for return of preterm contractions.  Recent significant course, she was admitted on 5/16 in pre-term labor to ante unit with regular UCs q 5-7 min on NST, cervix at dilated 3cm/50/-3. Given tocolysis with procardia PO and BMZ x 2 and GBS collected (positive results). Her contractions slowed down and cervix remained unchanged, discharged with < 1 week of procardia.  Today she reports that symptoms returned with some pelvic cramping around 1530 today while at work that improved, and then later returned around 1830 with worsening pain and associated LBP, seemed different compared to contractions on Monday, these seemed more related to back pain. Previously during the week she has had only irregular contractions. Trying to stay hydrated, good PO. - Currently with improving cramping since arrival in MAU - Admits to increased mucus-like discharge when wiping. Some nausea - +FM today - Denies any vaginal bleeding, LOF, fevers/chills, abdominal pain, HA, vision changes, swelling  Obstetrical/Gynecological History: OB History    Gravida Para Term Preterm AB TAB SAB Ectopic Multiple Living   4 3 2 1  0 0 0 0 0 2     Past Medical History: Past Medical History  Diagnosis Date  . Thyroid disease   . Headache(784.0)   . Infection     UTI    Past Surgical History: Past Surgical History  Procedure Laterality Date  . Vaginal delivery      X 2  . Mouth surgery    . No past surgeries      Family History: Family History  Problem Relation Age of Onset  . Cancer Mother     breast to bone  . Hypertension Father   . Diabetes Maternal Grandmother   . Stroke Maternal Grandmother   . Heart disease Paternal Grandmother     Social History: History  Substance Use Topics  .  Smoking status: Former Smoker    Types: Cigarettes    Quit date: 05/22/2012  . Smokeless tobacco: Never Used     Comment: Aug 2015  . Alcohol Use: No     Comment: No THC use since age 46    Allergies: No Known Allergies  Meds:  Prescriptions prior to admission  Medication Sig Dispense Refill Last Dose  . acetaminophen (TYLENOL) 500 MG tablet Take 1,000 mg by mouth every 6 (six) hours as needed for mild pain or headache.   Taking  . calcium carbonate (TUMS - DOSED IN MG ELEMENTAL CALCIUM) 500 MG chewable tablet Chew 2 tablets by mouth 2 (two) times daily as needed for indigestion or heartburn.   Taking  . NIFEdipine (PROCARDIA-XL/ADALAT CC) 30 MG 24 hr tablet Take 1 tablet (30 mg total) by mouth 2 (two) times daily. 7 tablet 0 Taking  . prenatal vitamin w/FE, FA (PRENATAL 1 + 1) 27-1 MG TABS tablet Take 1 tablet by mouth daily at 12 noon.   Taking    Review of Systems -  - See above HPI    Physical Exam  Blood pressure 127/66, pulse 115, temperature 98.3 F (36.8 C), temperature source Oral, resp. rate 20, height 5\' 5"  (1.651 m), weight 83.915 kg (185 lb), not currently breastfeeding. GENERAL: Well-developed, well-nourished female, sitting up in bed, comfortable and cooperative, NAD  HEENT: MMM LUNGS: CTAB HEART: Tachycardic, regular rhythm. MSK: Back: mostly  non-tender over spine and paraspinal muscles with bilateral SI tenderness ABDOMEN: Soft, nontender, nondistended, gravid for GA EXTREMITIES: Nontender, no edema, 2+ distal pulses. SKIN: warm and dry  CERVICAL EXAM: Dilatation 3cm   Effacement 50%   Station -3 - (unchanged from 5/17)   Presentation: cephalic FHT:  Baseline rate 150-155 bpm   Variability moderate  Accelerations present (15x15cm)   Decelerations none Contractions: No regular uterine contractions on toco or exam   Labs: Results for orders placed or performed in visit on 05/26/14 (from the past 24 hour(s))  POCT urinalysis dip (device)   Collection Time:  05/26/14  8:21 AM  Result Value Ref Range   Glucose, UA NEGATIVE NEGATIVE mg/dL   Bilirubin Urine NEGATIVE NEGATIVE   Ketones, ur NEGATIVE NEGATIVE mg/dL   Specific Gravity, Urine 1.015 1.005 - 1.030   Hgb urine dipstick NEGATIVE NEGATIVE   pH 7.5 5.0 - 8.0   Protein, ur NEGATIVE NEGATIVE mg/dL   Urobilinogen, UA 0.2 0.0 - 1.0 mg/dL   Nitrite NEGATIVE NEGATIVE   Leukocytes, UA SMALL (A) NEGATIVE   Imaging Studies:  US Ob Follow Up  05/12/2014   OBSTETRICAL ULTRASOUND: This exam was performed within a [facility]. The OB US report was generated in the AS system, and faxed to the ordering physician.   This report is available in the [facility]. See the AS Obstetric US report via the Image Link.   Assessment: Dana Gonzalez is  24 y.o. [phone] at [redacted]w[redacted]d presents with recurrent pelvic cramping concern for preterm contractions. Recently hospitalized for 24 hr obs for preterm contractions 5/16 to 5/17, resolved, treated with procardia (missed 2nd to last dose tonight). Symptoms with ?contractions returned tonight had been irregular, now seemed more back pain than contractions, some mucus but no LOF or VB. Currently in MAU pelvic cramping improving and seems to be mostly resolved. FHT reassuring with reactive strip, no regular UCs. Cervix unchanged from 5/17 (3cm/50/-3). Overall not consistent with pre-term labor.  Plan: 1. Discharge to home, reassurance, unlikely pre-term labor without regular contractions 2. Can finish prior home Procardia course (2 more doses), but advised since >34 weeks not necessary 3. Advised improve hydration, pelvic rest, PTL precautions 4. Return criteria given 5. Follow-up as scheduled on Mon 5/23 at 4:00pm for NST, routine prenatal as scheduled  Nobie Putnam, [place], PGY-2 5/19/201611:07 PM   I have seen and examined this patient and agree the above assessment. CRESENZO-DISHMAN,Dionel Archey 05/26/2014 11:44  PM

## 2014-05-26 NOTE — MAU Note (Signed)
Pt. Urine sent to lab

## 2014-05-26 NOTE — Progress Notes (Signed)
NST reviewed and reactive. Discussed procardia and PTL--she does not want to continue Procardia as it is making her feel weird Risks of PTB reviewed at length. No cervical change at present.

## 2014-05-26 NOTE — Discharge Instructions (Signed)
You had some increased abdominal cramping and possible uterine contractions, however everything sounds like these were irregular and now improving. It is reassuring and your fetal monitoring tonight is not consistent with labor. It sounds like your pain is now mostly in your lower back. - You may take the Procardia tonight and tomorrow to finish it, however you are now after 34 weeks and it may or may not even help at this time - Important to stay well hydrated, try to rest and avoid excessive activity if this is what triggered some of your symptoms  Follow-up on Monday at 4:00pm at [facility] clinic for your next NST. Follow-up as needed

## 2014-05-26 NOTE — Progress Notes (Signed)
Small leuks on udip.  Pt had hospital admit on 5/16 due to PTL.  Pt reports back pain today and decreased FM since last night. She also has a "sour" taste in her mouth since D/C from hospital on 5/17.  [place] for growth scheduled 6/16.

## 2014-05-26 NOTE — MAU Note (Addendum)
PT  SAYS   SHE WAS D/C ON Tuesday  FOR PTL  -  OBS  FOR  24 HRS--- SENT  HOME ON   PROCARDIA-   LAST DOSE AT 0730.  SUPPOSE   TO TAKE BID  -  DID  NOT TAKE  2 ND   DOSE  -   SHE WAS NOT  AT HOME  .   VE  WHILE  HERE  3 CM   .   DENIES  HSV AND   MRSA.

## 2014-05-26 NOTE — Patient Instructions (Addendum)
Breastfeeding Deciding to breastfeed is one of the best choices you can make for you and your baby. A change in hormones during pregnancy causes your breast tissue to grow and increases the number and size of your milk ducts. These hormones also allow proteins, sugars, and fats from your blood supply to make breast milk in your milk-producing glands. Hormones prevent breast milk from being released before your baby is born as well as prompt milk flow after birth. Once breastfeeding has begun, thoughts of your baby, as well as his or her sucking or crying, can stimulate the release of milk from your milk-producing glands.  BENEFITS OF BREASTFEEDING For Your Baby  Your first milk (colostrum) helps your baby's digestive system function better.   There are antibodies in your milk that help your baby fight off infections.   Your baby has a lower incidence of asthma, allergies, and sudden infant death syndrome.   The nutrients in breast milk are better for your baby than infant formulas and are designed uniquely for your baby's needs.   Breast milk improves your baby's brain development.   Your baby is less likely to develop other conditions, such as childhood obesity, asthma, or type 2 diabetes mellitus.  For You   Breastfeeding helps to create a very special bond between you and your baby.   Breastfeeding is convenient. Breast milk is always available at the correct temperature and costs nothing.   Breastfeeding helps to burn calories and helps you lose the weight gained during pregnancy.   Breastfeeding makes your uterus contract to its prepregnancy size faster and slows bleeding (lochia) after you give birth.   Breastfeeding helps to lower your risk of developing type 2 diabetes mellitus, osteoporosis, and breast or ovarian cancer later in life. SIGNS THAT YOUR BABY IS HUNGRY Early Signs of Hunger  Increased alertness or activity.  Stretching.  Movement of the head from  side to side.  Movement of the head and opening of the mouth when the corner of the mouth or cheek is stroked (rooting).  Increased sucking sounds, smacking lips, cooing, sighing, or squeaking.  Hand-to-mouth movements.  Increased sucking of fingers or hands. Late Signs of Hunger  Fussing.  Intermittent crying. Extreme Signs of Hunger Signs of extreme hunger will require calming and consoling before your baby will be able to breastfeed successfully. Do not wait for the following signs of extreme hunger to occur before you initiate breastfeeding:   Restlessness.  A loud, strong cry.   Screaming. BREASTFEEDING BASICS Breastfeeding Initiation  Find a comfortable place to sit or lie down, with your neck and back well supported.  Place a pillow or rolled up blanket under your baby to bring him or her to the level of your breast (if you are seated). Nursing pillows are specially designed to help support your arms and your baby while you breastfeed.  Make sure that your baby's abdomen is facing your abdomen.   Gently massage your breast. With your fingertips, massage from your chest wall toward your nipple in a circular motion. This encourages milk flow. You may need to continue this action during the feeding if your milk flows slowly.  Support your breast with 4 fingers underneath and your thumb above your nipple. Make sure your fingers are well away from your nipple and your baby's mouth.   Stroke your baby's lips gently with your finger or nipple.   When your baby's mouth is open wide enough, quickly bring your baby to your  breast, placing your entire nipple and as much of the colored area around your nipple (areola) as possible into your baby's mouth.   More areola should be visible above your baby's upper lip than below the lower lip.   Your baby's tongue should be between his or her lower gum and your breast.   Ensure that your baby's mouth is correctly positioned  around your nipple (latched). Your baby's lips should create a seal on your breast and be turned out (everted).  It is common for your baby to suck about 2-3 minutes in order to start the flow of breast milk. Latching Teaching your baby how to latch on to your breast properly is very important. An improper latch can cause nipple pain and decreased milk supply for you and poor weight gain in your baby. Also, if your baby is not latched onto your nipple properly, he or she may swallow some air during feeding. This can make your baby fussy. Burping your baby when you switch breasts during the feeding can help to get rid of the air. However, teaching your baby to latch on properly is still the best way to prevent fussiness from swallowing air while breastfeeding. Signs that your baby has successfully latched on to your nipple:    Silent tugging or silent sucking, without causing you pain.   Swallowing heard between every 3-4 sucks.    Muscle movement above and in front of his or her ears while sucking.  Signs that your baby has not successfully latched on to nipple:   Sucking sounds or smacking sounds from your baby while breastfeeding.  Nipple pain. If you think your baby has not latched on correctly, slip your finger into the corner of your baby's mouth to break the suction and place it between your baby's gums. Attempt breastfeeding initiation again. Signs of Successful Breastfeeding Signs from your baby:   A gradual decrease in the number of sucks or complete cessation of sucking.   Falling asleep.   Relaxation of his or her body.   Retention of a small amount of milk in his or her mouth.   Letting go of your breast by himself or herself. Signs from you:  Breasts that have increased in firmness, weight, and size 1-3 hours after feeding.   Breasts that are softer immediately after breastfeeding.  Increased milk volume, as well as a change in milk consistency and color by  the fifth day of breastfeeding.   Nipples that are not sore, cracked, or bleeding. Signs That Your [employer]el Books is Getting Enough Milk  Wetting at least 3 diapers in a 24-hour period. The urine should be clear and pale yellow by age 11 days.  At least 3 stools in a 24-hour period by age 11 days. The stool should be soft and yellow.  At least 3 stools in a 24-hour period by age 18 days. The stool should be seedy and yellow.  No loss of weight greater than 10% of birth weight during the first 55 days of age.  Average weight gain of 4-7 ounces (113-198 g) per week after age 36 days.  Consistent daily weight gain by age 9 days, without weight loss after the age of 2 weeks. After a feeding, your baby may spit up a small amount. This is common. BREASTFEEDING FREQUENCY AND DURATION Frequent feeding will help you make more milk and can prevent sore nipples and breast engorgement. Breastfeed when you feel the need to reduce the fullness of your breasts  or when your baby shows signs of hunger. This is called "breastfeeding on demand." Avoid introducing a pacifier to your baby while you are working to establish breastfeeding (the first 4-6 weeks after your baby is born). After this time you may choose to use a pacifier. Research has shown that pacifier use during the first year of a baby's life decreases the risk of sudden infant death syndrome (SIDS). Allow your baby to feed on each breast as long as he or she wants. Breastfeed until your baby is finished feeding. When your baby unlatches or falls asleep while feeding from the first breast, offer the second breast. Because newborns are often sleepy in the first few weeks of life, you may need to awaken your baby to get him or her to feed. Breastfeeding times will vary from baby to baby. However, the following rules can serve as a guide to help you ensure that your baby is properly fed:  Newborns (babies 5 weeks of age or younger) may breastfeed every 1-3  hours.  Newborns should not go longer than 3 hours during the day or 5 hours during the night without breastfeeding.  You should breastfeed your baby a minimum of 8 times in a 24-hour period until you begin to introduce solid foods to your baby at around 54 months of age. BREAST MILK PUMPING Pumping and storing breast milk allows you to ensure that your baby is exclusively fed your breast milk, even at times when you are unable to breastfeed. This is especially important if you are going back to work while you are still breastfeeding or when you are not able to be present during feedings. Your lactation consultant can give you guidelines on how long it is safe to store breast milk.  A breast pump is a machine that allows you to pump milk from your breast into a sterile bottle. The pumped breast milk can then be stored in a refrigerator or freezer. Some breast pumps are operated by hand, while others use electricity. Ask your lactation consultant which type will work best for you. Breast pumps can be purchased, but some hospitals and breastfeeding support groups lease breast pumps on a monthly basis. A lactation consultant can teach you how to hand express breast milk, if you prefer not to use a pump.  CARING FOR YOUR BREASTS WHILE YOU BREASTFEED Nipples can become dry, cracked, and sore while breastfeeding. The following recommendations can help keep your breasts moisturized and healthy:  Avoid using soap on your nipples.   Wear a supportive bra. Although not required, special nursing bras and tank tops are designed to allow access to your breasts for breastfeeding without taking off your entire bra or top. Avoid wearing underwire-style bras or extremely tight bras.  Air dry your nipples for 3-98minutes after each feeding.   Use only cotton bra pads to absorb leaked breast milk. Leaking of breast milk between feedings is normal.   Use lanolin on your nipples after breastfeeding. Lanolin helps to  maintain your skin's normal moisture barrier. If you use pure lanolin, you do not need to wash it off before feeding your baby again. Pure lanolin is not toxic to your baby. You may also hand express a few drops of breast milk and gently massage that milk into your nipples and allow the milk to air dry. In the first few weeks after giving birth, some women experience extremely full breasts (engorgement). Engorgement can make your breasts feel heavy, warm, and tender to the  touch. Engorgement peaks within 3-5 days after you give birth. The following recommendations can help ease engorgement:  Completely empty your breasts while breastfeeding or pumping. You may want to start by applying warm, moist heat (in the shower or with warm water-soaked hand towels) just before feeding or pumping. This increases circulation and helps the milk flow. If your baby does not completely empty your breasts while breastfeeding, pump any extra milk after he or she is finished.  Wear a snug bra (nursing or regular) or tank top for 1-2 days to signal your body to slightly decrease milk production.  Apply ice packs to your breasts, unless this is too uncomfortable for you.  Make sure that your baby is latched on and positioned properly while breastfeeding. If engorgement persists after 48 hours of following these recommendations, contact your health care provider or a Science writer. OVERALL HEALTH CARE RECOMMENDATIONS WHILE BREASTFEEDING  Eat healthy foods. Alternate between meals and snacks, eating 3 of each per day. Because what you eat affects your breast milk, some of the foods may make your baby more irritable than usual. Avoid eating these foods if you are sure that they are negatively affecting your baby.  Drink milk, fruit juice, and water to satisfy your thirst (about 10 glasses a day).   Rest often, relax, and continue to take your prenatal vitamins to prevent fatigue, stress, and anemia.  Continue  breast self-awareness checks.  Avoid chewing and smoking tobacco.  Avoid alcohol and drug use. Some medicines that may be harmful to your baby can pass through breast milk. It is important to ask your health care provider before taking any medicine, including all over-the-counter and prescription medicine as well as vitamin and herbal supplements. It is possible to become pregnant while breastfeeding. If birth control is desired, ask your health care provider about options that will be safe for your baby. SEEK MEDICAL CARE IF:   You feel like you want to stop breastfeeding or have become frustrated with breastfeeding.  You have painful breasts or nipples.  Your nipples are cracked or bleeding.  Your breasts are red, tender, or warm.  You have a swollen area on either breast.  You have a fever or chills.  You have nausea or vomiting.  You have drainage other than breast milk from your nipples.  Your breasts do not become full before feedings by the fifth day after you give birth.  You feel sad and depressed.  Your baby is too sleepy to eat well.  Your baby is having trouble sleeping.   Your baby is wetting less than 3 diapers in a 24-hour period.  Your baby has less than 3 stools in a 24-hour period.  Your baby's skin or the white part of his or her eyes becomes yellow.   Your baby is not gaining weight by 44 days of age. SEEK IMMEDIATE MEDICAL CARE IF:   Your baby is overly tired (lethargic) and does not want to wake up and feed.  Your baby develops an unexplained fever. Document Released: 12/24/2004 Document Revised: 12/29/2012 Document Reviewed: 06/17/2012 [facility] Patient Information 2015 [place], [place]. This information is not intended to replace advice given to you by your health care provider. Make sure you discuss any questions you have with your health care provider.  Preterm Labor Information Preterm labor is when labor starts at less than 37 weeks of  pregnancy. The normal length of a pregnancy is 39 to 41 weeks. CAUSES Often, there is no identifiable  underlying cause as to why a woman goes into preterm labor. One of the most common known causes of preterm labor is infection. Infections of the uterus, cervix, vagina, amniotic sac, bladder, kidney, or even the lungs (pneumonia) can cause labor to start. Other suspected causes of preterm labor include:   Urogenital infections, such as yeast infections and bacterial vaginosis.   Uterine abnormalities (uterine shape, uterine septum, fibroids, or bleeding from the placenta).   A cervix that has been operated on (it may fail to stay closed).   Malformations in the fetus.   Multiple gestations (twins, triplets, and so on).   Breakage of the amniotic sac.  RISK FACTORS  Having a previous history of preterm labor.   Having premature rupture of membranes (PROM).   Having a placenta that covers the opening of the cervix (placenta previa).   Having a placenta that separates from the uterus (placental abruption).   Having a cervix that is too weak to hold the fetus in the uterus (incompetent cervix).   Having too much fluid in the amniotic sac (polyhydramnios).   Taking illegal drugs or smoking while pregnant.   Not gaining enough weight while pregnant.   Being younger than 2 and older than 26 years old.   Having a low socioeconomic status.   Being African American. SYMPTOMS Signs and symptoms of preterm labor include:   Menstrual-like cramps, abdominal pain, or back pain.  Uterine contractions that are regular, as frequent as six in an hour, regardless of their intensity (may be mild or painful).  Contractions that start on the top of the uterus and spread down to the lower abdomen and back.   A sense of increased pelvic pressure.   A watery or bloody mucus discharge that comes from the vagina.  TREATMENT Depending on the length of the pregnancy and other  circumstances, your health care provider may suggest bed rest. If necessary, there are medicines that can be given to stop contractions and to mature the fetal lungs. If labor happens before 34 weeks of pregnancy, a prolonged hospital stay may be recommended. Treatment depends on the condition of both you and the fetus.  WHAT SHOULD YOU DO IF YOU THINK YOU ARE IN PRETERM LABOR? Call your health care provider right away. You will need to go to the hospital to get checked immediately. HOW CAN YOU PREVENT PRETERM LABOR IN FUTURE PREGNANCIES? You should:   Stop smoking if you smoke.  Maintain healthy weight gain and avoid chemicals and drugs that are not necessary.  Be watchful for any type of infection.  Inform your health care provider if you have a known history of preterm labor. Document Released: 03/16/2003 Document Revised: 08/26/2012 Document Reviewed: 01/27/2012 [facility] Patient Information 2015 [place], [place]. This information is not intended to replace advice given to you by your health care provider. Make sure you discuss any questions you have with your health care provider. Intrauterine Device Information An intrauterine device (IUD) is inserted into your uterus to prevent pregnancy. There are two types of IUDs available:   Copper IUD--This type of IUD is wrapped in copper wire and is placed inside the uterus. Copper makes the uterus and fallopian tubes produce a fluid that kills sperm. The copper IUD can stay in place for 10 years.  Hormone IUD--This type of IUD contains the hormone progestin (synthetic progesterone). The hormone thickens the cervical mucus and prevents sperm from entering the uterus. It also thins the uterine lining to prevent implantation of  a fertilized egg. The hormone can weaken or kill the sperm that get into the uterus. One type of hormone IUD can stay in place for 5 years, and another type can stay in place for 3 years. Your health care provider will make  sure you are a good candidate for a contraceptive IUD. Discuss with your health care provider the possible side effects.  ADVANTAGES OF AN INTRAUTERINE DEVICE  IUDs are highly effective, reversible, long acting, and low maintenance.   There are no estrogen-related side effects.   An IUD can be used when breastfeeding.   IUDs are not associated with weight gain.   The copper IUD works immediately after insertion.   The hormone IUD works right away if inserted within 7 days of your period starting. You will need to use a backup method of birth control for 7 days if the hormone IUD is inserted at any other time in your cycle.  The copper IUD does not interfere with your female hormones.   The hormone IUD can make heavy menstrual periods lighter and decrease cramping.   The hormone IUD can be used for 3 or 5 years.   The copper IUD can be used for 10 years. DISADVANTAGES OF AN INTRAUTERINE DEVICE  The hormone IUD can be associated with irregular bleeding patterns.   The copper IUD can make your menstrual flow heavier and more painful.   You may experience cramping and vaginal bleeding after insertion.  Document Released: 11/28/2003 Document Revised: 08/26/2012 Document Reviewed: 06/14/2012 [facility] Patient Information 2015 [place], [place]. This information is not intended to replace advice given to you by your health care provider. Make sure you discuss any questions you have with your health care provider.

## 2014-05-30 ENCOUNTER — Ambulatory Visit (INDEPENDENT_AMBULATORY_CARE_PROVIDER_SITE_OTHER): Payer: Medicaid Other | Admitting: *Deleted

## 2014-05-30 DIAGNOSIS — O09293 Supervision of pregnancy with other poor reproductive or obstetric history, third trimester: Secondary | ICD-10-CM

## 2014-05-31 ENCOUNTER — Inpatient Hospital Stay (HOSPITAL_COMMUNITY)
Admission: AD | Admit: 2014-05-31 | Discharge: 2014-06-01 | DRG: 778 | Disposition: A | Discharge status: Home / Self Care | Payer: Medicaid Other | Source: Ambulatory Visit | Attending: Family Medicine | Admitting: Family Medicine

## 2014-05-31 ENCOUNTER — Encounter (HOSPITAL_COMMUNITY): Payer: Self-pay

## 2014-05-31 DIAGNOSIS — O4703 False labor before 37 completed weeks of gestation, third trimester: Secondary | ICD-10-CM

## 2014-05-31 DIAGNOSIS — Z87891 Personal history of nicotine dependence: Secondary | ICD-10-CM

## 2014-05-31 DIAGNOSIS — O99820 Streptococcus B carrier state complicating pregnancy: Secondary | ICD-10-CM

## 2014-05-31 DIAGNOSIS — Z3A35 35 weeks gestation of pregnancy: Secondary | ICD-10-CM

## 2014-05-31 DIAGNOSIS — Z3A34 34 weeks gestation of pregnancy: Secondary | ICD-10-CM | POA: Diagnosis present

## 2014-05-31 DIAGNOSIS — IMO0001 Reserved for inherently not codable concepts without codable children: Secondary | ICD-10-CM

## 2014-05-31 LAB — CBC
HCT: 35.1 % — ABNORMAL LOW (ref 36.0–46.0)
Hemoglobin: 12.1 g/dL (ref 12.0–15.0)
MCH: 33.1 pg (ref 26.0–34.0)
MCHC: 34.5 g/dL (ref 30.0–36.0)
MCV: 95.9 fL (ref 78.0–100.0)
PLATELETS: 183 10*3/uL (ref 150–400)
RBC: 3.66 MIL/uL — ABNORMAL LOW (ref 3.87–5.11)
RDW: 13.9 % (ref 11.5–15.5)
WBC: 9.8 10*3/uL (ref 4.0–10.5)

## 2014-05-31 LAB — URINALYSIS, ROUTINE W REFLEX MICROSCOPIC
BILIRUBIN URINE: NEGATIVE
GLUCOSE, UA: NEGATIVE mg/dL
Ketones, ur: NEGATIVE mg/dL
Nitrite: NEGATIVE
Protein, ur: NEGATIVE mg/dL
Specific Gravity, Urine: 1.01 (ref 1.005–1.030)
Urobilinogen, UA: 0.2 mg/dL (ref 0.0–1.0)
pH: 7.5 (ref 5.0–8.0)

## 2014-05-31 LAB — TYPE AND SCREEN
ABO/RH(D): A POS
Antibody Screen: NEGATIVE

## 2014-05-31 LAB — URINE MICROSCOPIC-ADD ON

## 2014-05-31 MED ORDER — OXYCODONE-ACETAMINOPHEN 5-325 MG PO TABS: 2.0000 | ORAL_TABLET | ORAL | Status: DC | PRN

## 2014-05-31 MED ORDER — OXYTOCIN 40 UNITS IN LACTATED RINGERS INFUSION - SIMPLE MED: 62.5000 mL/h | INTRAVENOUS | Status: DC

## 2014-05-31 MED ORDER — OXYCODONE-ACETAMINOPHEN 5-325 MG PO TABS: 1.0000 | ORAL_TABLET | ORAL | Status: DC | PRN

## 2014-05-31 MED ORDER — ONDANSETRON HCL 4 MG/2ML IJ SOLN: 4.0000 mg | Freq: Four times a day (QID) | INTRAMUSCULAR | Status: DC | PRN

## 2014-05-31 MED ORDER — CITRIC ACID-SODIUM CITRATE 334-500 MG/5ML PO SOLN: 30.0000 mL | ORAL | Status: DC | PRN

## 2014-05-31 MED ORDER — LACTATED RINGERS IV SOLN: 500.0000 mL | INTRAVENOUS | Status: DC | PRN

## 2014-05-31 MED ORDER — NIFEDIPINE 10 MG PO CAPS
10.0000 mg | ORAL_CAPSULE | Freq: Once | ORAL | Status: AC
  Administered 2014-05-31: 10 mg via ORAL
  Filled 2014-05-31: qty 1

## 2014-05-31 MED ORDER — AMPICILLIN SODIUM 2 G IJ SOLR
2.0000 g | Freq: Once | INTRAMUSCULAR | Status: AC
  Administered 2014-05-31: 2 g via INTRAVENOUS
  Filled 2014-05-31: qty 2000

## 2014-05-31 MED ORDER — ACETAMINOPHEN 325 MG PO TABS: 650.0000 mg | ORAL_TABLET | ORAL | Status: DC | PRN

## 2014-05-31 MED ORDER — LIDOCAINE HCL (PF) 1 % IJ SOLN: 30.0000 mL | INTRAMUSCULAR | Status: DC | PRN

## 2014-05-31 MED ORDER — FLEET ENEMA 7-19 GM/118ML RE ENEM: 1.0000 | ENEMA | RECTAL | Status: DC | PRN

## 2014-05-31 MED ORDER — NALBUPHINE HCL 10 MG/ML IJ SOLN
10.0000 mg | INTRAMUSCULAR | Status: DC | PRN
  Administered 2014-05-31: 10 mg via INTRAVENOUS
  Filled 2014-05-31: qty 1

## 2014-05-31 MED ORDER — OXYCODONE-ACETAMINOPHEN 5-325 MG PO TABS
1.0000 | ORAL_TABLET | ORAL | Status: DC | PRN
Start: 2014-05-31 — End: 2014-05-31

## 2014-05-31 MED ORDER — OXYTOCIN BOLUS FROM INFUSION: 500.0000 mL | INTRAVENOUS | Status: DC

## 2014-05-31 MED ORDER — CITRIC ACID-SODIUM CITRATE 334-500 MG/5ML PO SOLN
30.0000 mL | ORAL | Status: DC | PRN
Start: 2014-05-31 — End: 2014-05-31

## 2014-05-31 MED ORDER — LACTATED RINGERS IV SOLN: INTRAVENOUS | Status: DC

## 2014-05-31 MED ORDER — FENTANYL CITRATE (PF) 100 MCG/2ML IJ SOLN: 100.0000 ug | INTRAMUSCULAR | Status: DC | PRN

## 2014-05-31 MED ORDER — LACTATED RINGERS IV SOLN
INTRAVENOUS | Status: DC
  Administered 2014-05-31: 13:00:00 via INTRAVENOUS

## 2014-05-31 MED ORDER — ACETAMINOPHEN 325 MG PO TABS
650.0000 mg | ORAL_TABLET | ORAL | Status: DC | PRN
  Administered 2014-05-31: 650 mg via ORAL
  Filled 2014-05-31: qty 2

## 2014-05-31 NOTE — Progress Notes (Signed)
LABOR PROGRESS NOTE  Subjective:  Comfortable, sitting up in bed. Admits to feeling contractions spacing out more, now feels every 5-7 min, more abdominal tightening and less pelvic pain / cramping. - Denies LOF, VB, HA, RUQ/epigastric abd pain, swelling.  Objective: BP 130/69 mmHg  Pulse 103  Temp(Src) 98.9 F (37.2 C) (Oral)  Resp 17  Ht 5\' 6"  (1.676 m)  Wt 83.462 kg (184 lb)  BMI 29.71 kg/m2  SpO2 100%  LMP  (LMP Unknown)      FHT:  FHR: 140 bpm, variability: moderate,  accelerations:  Present,  decelerations:  Absent UC:   irregular, every 3-7 minutes SVE:   Dilation: 5 Effacement (%): 70 Station: -3 Exam by:: jolynn   Deferred repeat cervical exam at this time, given pre-term labor.  Labs: Lab Results  Component Value Date   WBC 9.8 05/31/2014   HGB 12.1 05/31/2014   HCT 35.1* 05/31/2014   MCV 95.9 05/31/2014   PLT 183 05/31/2014    Assessment / Plan: Dana Gonzalez is a 24 y.o. [id number] at [redacted]w[redacted]d by 1st trimester [place] admitted for Preterm labor.  Labor: Expectant management of pre-term labor. S/p BMZ x 2 and Procardia x 2. Intact membranes. Gradual cervical change. No plans to augment or tocolyze labor. Unless significant clinical change defer SVE until AM. Fetal Wellbeing:  Category I Pain Control:  Fentanyl PRN, may have epidural I/D:  GBS positive s/p Amp 2g x 1 dose at 1600. Repeat doses as indicated Anticipated MOD:  NSVD  Nobie Putnam, [place], PGY-2  05/31/2014, 9:16 PM

## 2014-05-31 NOTE — Progress Notes (Signed)
Explained to patient due to cervical change and continued uterine activity will be admitted to L&D for labor.  Explained there are no rooms available on L&D at this time however will start IV and obtain admission labwork.

## 2014-05-31 NOTE — Progress Notes (Signed)
Patient ID: Dana Gonzalez, female   DOB: [birth year], 24 y.o.   MRN: [id number] Doing well, only some contractions are painful  Filed Vitals:   05/31/14 0852 05/31/14 1423  BP: 135/79 134/76  Pulse: 111 111  Temp: 98.6 F (37 C) 98.7 F (37.1 C)  TempSrc:  Oral  Resp: 18 18  Height: 5\' 5"  (1.651 m) 5\' 6"  (1.676 m)  Weight: 184 lb 3.2 oz (83.553 kg) 184 lb (83.462 kg)   FHR reactive, + accels, Category I UCs irregular, every 2-4 minutes  Cervix exam deferred Last exam:  Dilation: 5 Effacement (%): 70 Cervical Position: Middle (to pt's left) Station: -3 Exam by:: jolynn  Will continue to observe

## 2014-05-31 NOTE — MAU Note (Signed)
Notified Hansel Feinstein CNM cervical change 5/70/-3 will place admit orders.

## 2014-05-31 NOTE — MAU Note (Signed)
Onset of right side back pain radiating around to abdomen since last night,kept waking her up. Was seen about a week ago for preterm labor was given Procardia quantity 7 has finished taking, increased vaginal discharge described as mucus.  Denies vaginal bleeding, positive FM.

## 2014-05-31 NOTE — MAU Provider Note (Signed)
History   CSN: [id number]  Arrival date and time: 05/31/14 2947  Chief Complaint  Patient presents with  . Back Pain  . Abdominal Pain   HPI Patient is 24 y.o. [id number] [redacted]w[redacted]d here with complaints of lower abdominal cramping and contractions.  #Cramping: started yesterday morning. 2/10 cramping Dull-ache, period like cramp. Majority of cramping is on the left side of the LUQ near the inguinal area. Had not tried any medication or maneuver to alleviate it. Prolonged sitting makes it worse. Never experienced this type of cramping during this pregnancy or the previous ones.  #Contractions: Had sexual intercourse last night and contractions started subsequently. Had one contraction every 5-7 minutes, which has been constant since. Denies trauma other than sexual intercourse. Endorses good hydration.  Endorses LOF (clear, amounts to a liner for an hour, stopped since). Denies VB, trauma to abdomen. FM+  Prev Ob Hx: HRC for fetal demise. Last visit yesterday. 2 term deliverys, SVD. OB History  Gravida Para Term Preterm AB SAB TAB Ectopic Multiple Living  4 3 2 1  0 0 0 0 0 2    # Outcome Date GA Lbr Len/2nd Weight Sex Delivery Anes PTL Lv  4 Current           3 Term 02/07/13 [redacted]w[redacted]d 12:26 / 00:37 8 lb 15.6 oz (4.07 kg) F Vag-Spont EPI  Y  2 Term 05/18/08 [redacted]w[redacted]d  8 lb 6 oz (3.799 kg) F Vag-Spont EPI  Y  1 Preterm 10/22/06 [redacted]w[redacted]d    Vag-Spont Local  FD     Comments: amniotic band around umbilical cord     Complication this pregnancy: Admission for preterm labor.  Past Medical History  Diagnosis Date  . Thyroid disease   . Headache(784.0)   . Infection     UTI    Past Surgical History  Procedure Laterality Date  . Vaginal delivery      X 2  . Mouth surgery    . No past surgeries      Family History  Problem Relation Age of Onset  . Cancer Mother     breast to bone  . Hypertension Father   . Diabetes Maternal Grandmother   . Stroke Maternal Grandmother   . Heart disease  Paternal Grandmother     History  Substance Use Topics  . Smoking status: Former Smoker    Types: Cigarettes    Quit date: 05/22/2012  . Smokeless tobacco: Never Used     Comment: Aug 2015  . Alcohol Use: No     Comment: No THC use since age 53    Allergies: No Known Allergies  Prescriptions prior to admission  Medication Sig Dispense Refill Last Dose  . acetaminophen (TYLENOL) 500 MG tablet Take 1,000 mg by mouth every 6 (six) hours as needed for mild pain or headache.   05/30/2014 at Unknown time  . calcium carbonate (TUMS - DOSED IN MG ELEMENTAL CALCIUM) 500 MG chewable tablet Chew 2 tablets by mouth 2 (two) times daily as needed for indigestion or heartburn.   05/30/2014 at Unknown time  . prenatal vitamin w/FE, FA (PRENATAL 1 + 1) 27-1 MG TABS tablet Take 1 tablet by mouth daily at 12 noon.   05/30/2014 at Unknown time  . NIFEdipine (PROCARDIA-XL/ADALAT CC) 30 MG 24 hr tablet Take 1 tablet (30 mg total) by mouth 2 (two) times daily. (Patient not taking: Reported on 05/31/2014) 7 tablet 0 Taking    ROS per HPI.  Physical Exam   Blood  pressure 135/79, pulse 111, temperature 98.6 F (37 C), resp. rate 18, height 5\' 5"  (1.651 m), weight 184 lb 3.2 oz (83.553 kg), not currently breastfeeding.  Physical Exam  Constitutional: She is oriented to person, place, and time. She appears well-developed. No distress.  Eyes: EOM are normal.  Cardiovascular: Normal rate, regular rhythm and normal heart sounds.   Respiratory: Effort normal and breath sounds normal.  GI: There is no tenderness.  Musculoskeletal: She exhibits no edema or tenderness.  Neurological: She is alert and oriented to person, place, and time. She has normal reflexes.  Skin: Skin is warm and dry.   MAU Course  Procedures - None  MDM -pt s/p BMZ x2 and tocyolisis on 5/16 -NST reassuring -gave another dose of procardia without improvement -UA concerning for UTI urine culture sent  Assessment and Plan  Patient is  24 y.o. [id number] [redacted]w[redacted]d reporting contractions and cramping likely secondary to intercourse causing uterine irritablitiy.   Preterm Labor: On cervical recheck patient with changed exam. Will admit to labor and delivery for admission.   Luiz Blare, DO 05/31/2014, 12:19 PM PGY-1, [place]  Seen and examined by me Agree with note Discussed we will not actively [place] PTL Also discussed we will not augment labor Plan observation Seabron Spates, CNM

## 2014-05-31 NOTE — MAU Note (Deleted)
HPI Presenting for lower abdominal cramping and contractions.  #cramping: started yesterday morning. 2/10 cramping Dull-ache, period like cramp. Majority of cramping is on the left side of the LUQ near the inguinal area. Had not tried any medication or maneuver to alleviate it. Prolonged sitting makes it worse. Never experienced this type of cramping during this pregnancy or the previous ones.  #contractions: Had sexual intercourse last night and contractions started subsequently. Had one contraction every 5-7 minutes, which has been constant since. Denies trauma other than sexual intercourse. Endorses good hydration.  Endorses LOF (clear, amounts to a liner for an hour, stopped since). Denies VG, trauma to abdomen. FM+  Prev Ob Hx: HRC for fetal demise. Last visit yesterday. 2 term deliverys, SVD. Complication this pregnancy: Admission for preterm labor.

## 2014-05-31 NOTE — Progress Notes (Signed)
Patient reports having sexual intercourse last night.

## 2014-05-31 NOTE — Progress Notes (Signed)
Patient off efm to bathroom.

## 2014-05-31 NOTE — H&P (Signed)
LABOR ADMISSION HISTORY AND PHYSICAL  Dana Gonzalez is a 24 y.o. female [phone] with IUP at [redacted]w[redacted]d by 1st trimester [place] presenting for preterm labor. She reports +FM, + contractions, No LOF, no VB, no blurry vision, headaches or peripheral edema, and RUQ pain.  She plans on breast feeding. She request IUD for birth control.  Patient was seen prior to admission in MAU for lower abdominal cramping and contractions.  #Cramping: started yesterday morning. 2/10 cramping Dull-ache, period like cramp. Majority of cramping is on the left side of the LUQ near the inguinal area. Had not tried any medication or maneuver to alleviate it. Prolonged sitting makes it worse. Never experienced this type of cramping during this pregnancy or the previous ones.   #Contractions: Had sexual intercourse last night and contractions started subsequently. Had one contraction every 5-7 minutes, which has been constant since. Denies trauma other than sexual intercourse. Endorses good hydration.  Endorses LOF (clear, amounts to a liner for an hour, stopped since). Denies VB, trauma to abdomen. FM+  Prev Ob Hx: HRC for fetal demise. Last visit yesterday. 2 term deliverys, SVD.   Dating: By 1st trimester [place] --->  Estimated Date of Delivery: 07/06/14  Prenatal History/Complications: Received OB care in [facility]  Past Medical History: Past Medical History  Diagnosis Date  . Thyroid disease   . Headache(784.0)   . Infection     UTI    Past Surgical History: Past Surgical History  Procedure Laterality Date  . Vaginal delivery      X 2  . Mouth surgery    . No past surgeries      Obstetrical History: OB History    Gravida Para Term Preterm AB TAB SAB Ectopic Multiple Living   4 3 2 1  0 0 0 0 0 2      Social History: History   Social History  . Marital Status: Single    Spouse Name: N/A  . Number of Children: N/A  . Years of Education: N/A   Social History Main Topics  . Smoking status: Former Smoker   Types: Cigarettes    Quit date: 05/22/2012  . Smokeless tobacco: Never Used     Comment: Aug 2015  . Alcohol Use: No     Comment: No THC use since age 39  . Drug Use: No     Comment: not since age 76  . Sexual Activity: Yes   Other Topics Concern  . None   Social History Narrative    Family History: Family History  Problem Relation Age of Onset  . Cancer Mother     breast to bone  . Hypertension Father   . Diabetes Maternal Grandmother   . Stroke Maternal Grandmother   . Heart disease Paternal Grandmother     Allergies: No Known Allergies  Prescriptions prior to admission  Medication Sig Dispense Refill Last Dose  . acetaminophen (TYLENOL) 500 MG tablet Take 1,000 mg by mouth every 6 (six) hours as needed for mild pain or headache.   05/30/2014 at Unknown time  . calcium carbonate (TUMS - DOSED IN MG ELEMENTAL CALCIUM) 500 MG chewable tablet Chew 2 tablets by mouth 2 (two) times daily as needed for indigestion or heartburn.   05/30/2014 at Unknown time  . prenatal vitamin w/FE, FA (PRENATAL 1 + 1) 27-1 MG TABS tablet Take 1 tablet by mouth daily at 12 noon.   05/30/2014 at Unknown time  . NIFEdipine (PROCARDIA-XL/ADALAT CC) 30 MG 24 hr tablet Take  1 tablet (30 mg total) by mouth 2 (two) times daily. (Patient not taking: Reported on 05/31/2014) 7 tablet 0 Taking   Review of Systems   All systems reviewed and negative except as stated in HPI  BP 135/79 mmHg  Pulse 111  Temp(Src) 98.6 F (37 C)  Resp 18  Ht 5\' 5"  (1.651 m)  Wt 184 lb 3.2 oz (83.553 kg)  BMI 30.65 kg/m2  LMP  (LMP Unknown) Constitutional: She is oriented to person, place, and time. She appears well-developed. No distress.  Eyes: EOM are normal.  Cardiovascular: Normal rate, regular rhythm and normal heart sounds.  Respiratory: Effort normal and breath sounds normal.  GI: There is no tenderness.  Musculoskeletal: She exhibits no edema or tenderness.  Neurological: She is alert and oriented to person,  place, and time. She has normal reflexes.  Skin: Skin is warm and dry.  Presentation: cephalic Fetal monitoringBaseline: 140 bpm, Variability: Good {> 6 bpm), Accelerations: Reactive and Decelerations: Absent Uterine activityFrequency: Every 4-7 minutes Dilation: 5 Effacement (%): 70 Station: -3 Exam by:: jolynn   Prenatal labs: ABO, Rh: A/POS/-- (12/18 1218) Antibody: NEG (12/18 1218) Rubella:  Immune RPR: NON REAC (04/07 1418)  HBsAg: NEGATIVE (12/18 1218)  HIV: NONREACTIVE (04/07 1418)  GBS:   Positive 1 hr Glucola third trimester: 89 Genetic screening  normal Anatomy US normal  Prenatal Transfer Tool  Maternal Diabetes: No Genetic Screening: Normal Maternal Ultrasounds/Referrals: Normal Fetal Ultrasounds or other Referrals:  None Maternal Substance Abuse:  No Significant Maternal Medications:  None Significant Maternal Lab Results: Lab values include: Group B Strep positive  Results for orders placed or performed during the hospital encounter of 05/31/14 (from the past 24 hour(s))  Urinalysis, Routine w reflex microscopic   Collection Time: 05/31/14  8:44 AM  Result Value Ref Range   Color, Urine YELLOW YELLOW   APPearance HAZY (A) CLEAR   Specific Gravity, Urine 1.010 1.005 - 1.030   pH 7.5 5.0 - 8.0   Glucose, UA NEGATIVE NEGATIVE mg/dL   Hgb urine dipstick TRACE (A) NEGATIVE   Bilirubin Urine NEGATIVE NEGATIVE   Ketones, ur NEGATIVE NEGATIVE mg/dL   Protein, ur NEGATIVE NEGATIVE mg/dL   Urobilinogen, UA 0.2 0.0 - 1.0 mg/dL   Nitrite NEGATIVE NEGATIVE   Leukocytes, UA LARGE (A) NEGATIVE  Urine microscopic-add on   Collection Time: 05/31/14  8:44 AM  Result Value Ref Range   Squamous Epithelial / LPF MANY (A) RARE   WBC, UA 3-6 <3 WBC/hpf   Bacteria, UA FEW (A) RARE   Urine-Other YEAST     Patient Active Problem List   Diagnosis Date Noted  . Group B Streptococcus carrier, +RV culture, currently pregnant 05/25/2014  . Preterm labor 05/23/2014  . Prior  pregnancy with fetal demise and current pregnancy   . Supervision of high risk pregnancy, antepartum 12/24/2013    Assessment: Dana Gonzalez is a 24 y.o. [id number] at 101w6d here for preterm labor. Was evaluated in MAU (see MAU note). No need for BMZ as patient is 34.6w and s/p two doses of BMZ last week. SHe also had received tocolysis.    #Labor: Expectant management. Plan on SVD. No augmentation at this time. #Pain: Wants no pain medication at this time.  #FWB: Cat 1 #ID: GBS positive - ampicillin #MOF: Breast  #MOC: IUD #Circ: female  Luiz Blare, DO 05/31/2014, 1:04 PM PGY-1, [place]  Seen and examined by me also Agree with note Procardia ineffective  Consulted Dr  Stinson Admit for observation and possible delivery  Seabron Spates, CNM

## 2014-05-31 NOTE — Progress Notes (Signed)
Notified Hansel Feinstein CNM patient in room 6 preterm with contractions.

## 2014-06-01 LAB — URINE CULTURE: Colony Count: 50000

## 2014-06-01 LAB — RPR: RPR Ser Ql: NONREACTIVE

## 2014-06-01 MED ORDER — OXYCODONE-ACETAMINOPHEN 5-325 MG PO TABS: 1.0000 | ORAL_TABLET | Freq: Four times a day (QID) | ORAL | Status: DC | PRN

## 2014-06-01 MED ORDER — SODIUM CHLORIDE 0.9 % IV SOLN
2.0000 g | Freq: Four times a day (QID) | INTRAVENOUS | Status: DC
  Administered 2014-06-01: 2 g via INTRAVENOUS
  Filled 2014-06-01 (×3): qty 2000

## 2014-06-01 NOTE — Progress Notes (Signed)
Patient and husband given D/C instructions including education on medications, fetal kick counts/movement, appointment follow ups and preterm labor precautions. Patient verbalizes understanding and has no further questions at this time. Patient d/c'd home with husband.

## 2014-06-01 NOTE — Discharge Summary (Signed)
Obstetric Discharge Summary Reason for Admission: Preterm labor iwth cervical change Prenatal Procedures: NST Intrapartum Procedures: None Postpartum Procedures: none Complications-Operative and Postpartum: none and Did not deliver HEMOGLOBIN  Date Value Ref Range Status  05/31/2014 12.1 12.0 - 15.0 g/dL Final  11/24/2012 12.6 g/dL Final   HCT  Date Value Ref Range Status  05/31/2014 35.1* 36.0 - 46.0 % Final  11/24/2012 38 % Final    Physical Exam:  General: alert, cooperative and no distress Lochia: appropriate Uterine Fundus: Gravid, 35cm Incision: n/a DVT Evaluation: No evidence of DVT seen on physical exam.  Cervix 3-4cm/70/-2/vertex  FHR reactive Irregular mild contractions  Discharge Diagnoses: Premature labor and No delivery, cervix stabilized at 3-4cm  Discharge Information: Date: 06/01/2014 Activity: unrestricted and pelvic rest Diet: routine Medications: PNV and Percocet Condition: stable and improved Instructions: refer to practice specific booklet and Preterm labor precautions Discharge to: home Follow-up Information    Follow up with Joseph. Schedule an appointment as soon as possible for a visit in 2 days.   Contact information:   [facility] [phone]     Reviewed signs of labor, PROM, bleeding and fetal movement Come back to MAU prn Keep appt in [facility] for OB care   [facility] 06/01/2014, 10:35 AM

## 2014-06-01 NOTE — Progress Notes (Signed)
LABOR PROGRESS NOTE  Subjective:  Attempting to sleep. S/p Nubain for pain and rest. Feeling some pelvic pressure, spaced out. - Denies LOF, VB, HA, RUQ/epigastric abd pain, swelling.  Objective: BP 108/60 mmHg  Pulse 97  Temp(Src) 98.2 F (36.8 C) (Oral)  Resp 18  Ht 5\' 6"  (1.676 m)  Wt 83.462 kg (184 lb)  BMI 29.71 kg/m2  SpO2 100%  LMP  (LMP Unknown)      FHT:  FHR: 120 bpm, variability: moderate,  accelerations:  Abscent,  decelerations:  Absent UC:   irregular SVE:   Dilation: 4 Effacement (%): 60 Station: -3 Exam by:: Golden Hurter RN-BSN   Labs: Lab Results  Component Value Date   WBC 9.8 05/31/2014   HGB 12.1 05/31/2014   HCT 35.1* 05/31/2014   MCV 95.9 05/31/2014   PLT 183 05/31/2014    Assessment / Plan: Dana Gonzalez is a 24 y.o. [id number] at [redacted]w[redacted]d by 1st trimester [place] admitted for Preterm labor.  Labor: Expectant management of pre-term labor. S/p BMZ x 2 and Procardia x 2. Intact membranes. No plans to augment or tocolyze labor. Poor cervical change. Fetal Wellbeing:  Category I Pain Control:  Fentanyl PRN, Nubain PRN, may have epidural I/D:  GBS positive s/p Amp 2g x 1 dose at 1600. Repeat doses as indicated Anticipated MOD:  NSVD  Nobie Putnam, [place], PGY-2  06/01/2014, 2:09 AM

## 2014-06-01 NOTE — Discharge Instructions (Signed)
Preterm Labor Information Preterm labor is when labor starts at less than 37 weeks of pregnancy. The normal length of a pregnancy is 39 to 41 weeks. CAUSES Often, there is no identifiable underlying cause as to why a woman goes into preterm labor. One of the most common known causes of preterm labor is infection. Infections of the uterus, cervix, vagina, amniotic sac, bladder, kidney, or even the lungs (pneumonia) can cause labor to start. Other suspected causes of preterm labor include:   Urogenital infections, such as yeast infections and bacterial vaginosis.   Uterine abnormalities (uterine shape, uterine septum, fibroids, or bleeding from the placenta).   A cervix that has been operated on (it may fail to stay closed).   Malformations in the fetus.   Multiple gestations (twins, triplets, and so on).   Breakage of the amniotic sac.  RISK FACTORS 1. Having a previous history of preterm labor.  2. Having premature rupture of membranes (PROM).  3. Having a placenta that covers the opening of the cervix (placenta previa).  4. Having a placenta that separates from the uterus (placental abruption).  5. Having a cervix that is too weak to hold the fetus in the uterus (incompetent cervix).  6. Having too much fluid in the amniotic sac (polyhydramnios).  7. Taking illegal drugs or smoking while pregnant.  8. Not gaining enough weight while pregnant.  49. Being younger than 5 and older than 26 years old.  10. Having a low socioeconomic status.  72. Being African American. SYMPTOMS Signs and symptoms of preterm labor include:   Menstrual-like cramps, abdominal pain, or back pain.  Uterine contractions that are regular, as frequent as six in an hour, regardless of their intensity (may be mild or painful).  Contractions that start on the top of the uterus and spread down to the lower abdomen and back.   A sense of increased pelvic pressure.   A watery or bloody mucus  discharge that comes from the vagina.  TREATMENT Depending on the length of the pregnancy and other circumstances, your health care provider may suggest bed rest. If necessary, there are medicines that can be given to stop contractions and to mature the fetal lungs. If labor happens before 34 weeks of pregnancy, a prolonged hospital stay may be recommended. Treatment depends on the condition of both you and the fetus.  WHAT SHOULD YOU DO IF YOU THINK YOU ARE IN PRETERM LABOR? Call your health care provider right away. You will need to go to the hospital to get checked immediately. HOW CAN YOU PREVENT PRETERM LABOR IN FUTURE PREGNANCIES? You should:   Stop smoking if you smoke.  Maintain healthy weight gain and avoid chemicals and drugs that are not necessary.  Be watchful for any type of infection.  Inform your health care provider if you have a known history of preterm labor. Document Released: 03/16/2003 Document Revised: 08/26/2012 Document Reviewed: 01/27/2012 [facility] Patient Information 2015 [place], [place]. This information is not intended to replace advice given to you by your health care provider. Make sure you discuss any questions you have with your health care provider. Fetal Movement Counts Patient Name: __________________________________________________ Patient Due Date: ____________________ Performing a fetal movement count is highly recommended in high-risk pregnancies, but it is good for every pregnant woman to do. Your health care provider may ask you to start counting fetal movements at 28 weeks of the pregnancy. Fetal movements often increase:  After eating a full meal.  After physical activity.  After  eating or drinking something sweet or cold.  At rest. Pay attention to when you feel the baby is most active. This will help you notice a pattern of your baby's sleep and wake cycles and what factors contribute to an increase in fetal movement. It is important to  perform a fetal movement count at the same time each day when your baby is normally most active.  HOW TO COUNT FETAL MOVEMENTS 12. Find a quiet and comfortable area to sit or lie down on your left side. Lying on your left side provides the best blood and oxygen circulation to your baby. 13. Write down the day and time on a sheet of paper or in a journal. 14. Start counting kicks, flutters, swishes, rolls, or jabs in a 2-hour period. You should feel at least 10 movements within 2 hours. 15. If you do not feel 10 movements in 2 hours, wait 2-3 hours and count again. Look for a change in the pattern or not enough counts in 2 hours. SEEK MEDICAL CARE IF:  You feel less than 10 counts in 2 hours, tried twice.  There is no movement in over an hour.  The pattern is changing or taking longer each day to reach 10 counts in 2 hours.  You feel the baby is not moving as he or she usually does. Date: ____________ Movements: ____________ Start time: ____________ Elizebeth Koller time: ____________  Date: ____________ Movements: ____________ Start time: ____________ Elizebeth Koller time: ____________ Date: ____________ Movements: ____________ Start time: ____________ Elizebeth Koller time: ____________ Date: ____________ Movements: ____________ Start time: ____________ Elizebeth Koller time: ____________ Date: ____________ Movements: ____________ Start time: ____________ Elizebeth Koller time: ____________ Date: ____________ Movements: ____________ Start time: ____________ Elizebeth Koller time: ____________ Date: ____________ Movements: ____________ Start time: ____________ Elizebeth Koller time: ____________ Date: ____________ Movements: ____________ Start time: ____________ Elizebeth Koller time: ____________  Date: ____________ Movements: ____________ Start time: ____________ Elizebeth Koller time: ____________ Date: ____________ Movements: ____________ Start time: ____________ Elizebeth Koller time: ____________ Date: ____________ Movements: ____________ Start time: ____________ Elizebeth Koller time:  ____________ Date: ____________ Movements: ____________ Start time: ____________ Elizebeth Koller time: ____________ Date: ____________ Movements: ____________ Start time: ____________ Elizebeth Koller time: ____________ Date: ____________ Movements: ____________ Start time: ____________ Elizebeth Koller time: ____________ Date: ____________ Movements: ____________ Start time: ____________ Elizebeth Koller time: ____________  Date: ____________ Movements: ____________ Start time: ____________ Elizebeth Koller time: ____________ Date: ____________ Movements: ____________ Start time: ____________ Elizebeth Koller time: ____________ Date: ____________ Movements: ____________ Start time: ____________ Elizebeth Koller time: ____________ Date: ____________ Movements: ____________ Start time: ____________ Elizebeth Koller time: ____________ Date: ____________ Movements: ____________ Start time: ____________ Elizebeth Koller time: ____________ Date: ____________ Movements: ____________ Start time: ____________ Elizebeth Koller time: ____________ Date: ____________ Movements: ____________ Start time: ____________ Elizebeth Koller time: ____________  Date: ____________ Movements: ____________ Start time: ____________ Elizebeth Koller time: ____________ Date: ____________ Movements: ____________ Start time: ____________ Elizebeth Koller time: ____________ Date: ____________ Movements: ____________ Start time: ____________ Elizebeth Koller time: ____________ Date: ____________ Movements: ____________ Start time: ____________ Elizebeth Koller time: ____________ Date: ____________ Movements: ____________ Start time: ____________ Elizebeth Koller time: ____________ Date: ____________ Movements: ____________ Start time: ____________ Elizebeth Koller time: ____________ Date: ____________ Movements: ____________ Start time: ____________ Elizebeth Koller time: ____________  Date: ____________ Movements: ____________ Start time: ____________ Elizebeth Koller time: ____________ Date: ____________ Movements: ____________ Start time: ____________ Elizebeth Koller time: ____________ Date: ____________ Movements:  ____________ Start time: ____________ Elizebeth Koller time: ____________ Date: ____________ Movements: ____________ Start time: ____________ Elizebeth Koller time: ____________ Date: ____________ Movements: ____________ Start time: ____________ Elizebeth Koller time: ____________ Date: ____________ Movements: ____________ Start time: ____________ Elizebeth Koller time: ____________ Date: ____________ Movements: ____________ Start time: ____________ Elizebeth Koller time: ____________  Date:  ____________ Movements: ____________ Start time: ____________ Elizebeth Koller time: ____________ Date: ____________ Movements: ____________ Start time: ____________ Elizebeth Koller time: ____________ Date: ____________ Movements: ____________ Start time: ____________ Elizebeth Koller time: ____________ Date: ____________ Movements: ____________ Start time: ____________ Elizebeth Koller time: ____________ Date: ____________ Movements: ____________ Start time: ____________ Elizebeth Koller time: ____________ Date: ____________ Movements: ____________ Start time: ____________ Elizebeth Koller time: ____________ Date: ____________ Movements: ____________ Start time: ____________ Elizebeth Koller time: ____________  Date: ____________ Movements: ____________ Start time: ____________ Elizebeth Koller time: ____________ Date: ____________ Movements: ____________ Start time: ____________ Elizebeth Koller time: ____________ Date: ____________ Movements: ____________ Start time: ____________ Elizebeth Koller time: ____________ Date: ____________ Movements: ____________ Start time: ____________ Elizebeth Koller time: ____________ Date: ____________ Movements: ____________ Start time: ____________ Elizebeth Koller time: ____________ Date: ____________ Movements: ____________ Start time: ____________ Elizebeth Koller time: ____________ Date: ____________ Movements: ____________ Start time: ____________ Elizebeth Koller time: ____________  Date: ____________ Movements: ____________ Start time: ____________ Elizebeth Koller time: ____________ Date: ____________ Movements: ____________ Start time: ____________ Elizebeth Koller  time: ____________ Date: ____________ Movements: ____________ Start time: ____________ Elizebeth Koller time: ____________ Date: ____________ Movements: ____________ Start time: ____________ Elizebeth Koller time: ____________ Date: ____________ Movements: ____________ Start time: ____________ Elizebeth Koller time: ____________ Date: ____________ Movements: ____________ Start time: ____________ Elizebeth Koller time: ____________ Document Released: 01/23/2006 Document Revised: 05/10/2013 Document Reviewed: 10/21/2011 [employer] Patient Information 2015 [place]. This information is not intended to replace advice given to you by your health care provider. Make sure you discuss any questions you have with your health care provider. Preterm Labor Information Preterm labor is when labor starts at less than 37 weeks of pregnancy. The normal length of a pregnancy is 39 to 41 weeks. CAUSES Often, there is no identifiable underlying cause as to why a woman goes into preterm labor. One of the most common known causes of preterm labor is infection. Infections of the uterus, cervix, vagina, amniotic sac, bladder, kidney, or even the lungs (pneumonia) can cause labor to start. Other suspected causes of preterm labor include:   Urogenital infections, such as yeast infections and bacterial vaginosis.   Uterine abnormalities (uterine shape, uterine septum, fibroids, or bleeding from the placenta).   A cervix that has been operated on (it may fail to stay closed).   Malformations in the fetus.   Multiple gestations (twins, triplets, and so on).   Breakage of the amniotic sac.  RISK FACTORS 16. Having a previous history of preterm labor.  29. Having premature rupture of membranes (PROM).  8. Having a placenta that covers the opening of the cervix (placenta previa).  73. Having a placenta that separates from the uterus (placental abruption).  79. Having a cervix that is too weak to hold the fetus in the uterus (incompetent  cervix).  21. Having too much fluid in the amniotic sac (polyhydramnios).  72. Taking illegal drugs or smoking while pregnant.  23. Not gaining enough weight while pregnant.  69. Being younger than 58 and older than 26 years old.  25. Having a low socioeconomic status.  29. Being African American. SYMPTOMS Signs and symptoms of preterm labor include:   Menstrual-like cramps, abdominal pain, or back pain.  Uterine contractions that are regular, as frequent as six in an hour, regardless of their intensity (may be mild or painful).  Contractions that start on the top of the uterus and spread down to the lower abdomen and back.   A sense of increased pelvic pressure.   A watery or bloody mucus discharge that comes from the vagina.  TREATMENT Depending on the length of the pregnancy and other circumstances, your health  care provider may suggest bed rest. If necessary, there are medicines that can be given to stop contractions and to mature the fetal lungs. If labor happens before 34 weeks of pregnancy, a prolonged hospital stay may be recommended. Treatment depends on the condition of both you and the fetus.  WHAT SHOULD YOU DO IF YOU THINK YOU ARE IN PRETERM LABOR? Call your health care provider right away. You will need to go to the hospital to get checked immediately. HOW CAN YOU PREVENT PRETERM LABOR IN FUTURE PREGNANCIES? You should:   Stop smoking if you smoke.  Maintain healthy weight gain and avoid chemicals and drugs that are not necessary.  Be watchful for any type of infection.  Inform your health care provider if you have a known history of preterm labor. Document Released: 03/16/2003 Document Revised: 08/26/2012 Document Reviewed: 01/27/2012 [facility] Patient Information 2015 [place], [place]. This information is not intended to replace advice given to you by your health care provider. Make sure you discuss any questions you have with your health care provider.

## 2014-06-02 ENCOUNTER — Ambulatory Visit (INDEPENDENT_AMBULATORY_CARE_PROVIDER_SITE_OTHER): Payer: Medicaid Other | Admitting: Obstetrics and Gynecology

## 2014-06-02 ENCOUNTER — Encounter: Payer: Self-pay | Admitting: Obstetrics and Gynecology

## 2014-06-02 ENCOUNTER — Other Ambulatory Visit: Payer: Medicaid Other

## 2014-06-02 VITALS — BP 98/50 | HR 84 | Wt 180.7 lb

## 2014-06-02 DIAGNOSIS — O0993 Supervision of high risk pregnancy, unspecified, third trimester: Secondary | ICD-10-CM

## 2014-06-02 DIAGNOSIS — O99820 Streptococcus B carrier state complicating pregnancy: Secondary | ICD-10-CM

## 2014-06-02 DIAGNOSIS — O09293 Supervision of pregnancy with other poor reproductive or obstetric history, third trimester: Secondary | ICD-10-CM | POA: Diagnosis not present

## 2014-06-02 DIAGNOSIS — Z22330 Carrier of Group B streptococcus: Secondary | ICD-10-CM

## 2014-06-02 LAB — POCT URINALYSIS DIP (DEVICE)
BILIRUBIN URINE: NEGATIVE
GLUCOSE, UA: NEGATIVE mg/dL
Ketones, ur: NEGATIVE mg/dL
NITRITE: NEGATIVE
PH: 7.5 (ref 5.0–8.0)
Protein, ur: NEGATIVE mg/dL
Specific Gravity, Urine: 1.015 (ref 1.005–1.030)
UROBILINOGEN UA: 0.2 mg/dL (ref 0.0–1.0)

## 2014-06-02 NOTE — Progress Notes (Signed)
Pt states she was admitted for PTL on 5/24, discharged yesterday morning due to no cervical change.  FHR reactive while inpatient - NST not needed today. AFI completed.  Next [place] for growth scheduled 6/16.

## 2014-06-02 NOTE — Progress Notes (Signed)
Patient is doing well without complaints. FM/PTL precautions reviewed. Follow up ultrasound on 6/16. GC/Cl Cx next week

## 2014-06-04 ENCOUNTER — Inpatient Hospital Stay (HOSPITAL_COMMUNITY): Payer: Medicaid Other | Admitting: Anesthesiology

## 2014-06-04 ENCOUNTER — Inpatient Hospital Stay (HOSPITAL_COMMUNITY)
Admission: AD | Admit: 2014-06-04 | Discharge: 2014-06-06 | DRG: 775 | Disposition: A | Discharge status: Home / Self Care | Payer: Medicaid Other | Source: Ambulatory Visit | Attending: Family Medicine | Admitting: Family Medicine

## 2014-06-04 ENCOUNTER — Encounter (HOSPITAL_COMMUNITY): Payer: Self-pay | Admitting: *Deleted

## 2014-06-04 DIAGNOSIS — Z87891 Personal history of nicotine dependence: Secondary | ICD-10-CM

## 2014-06-04 DIAGNOSIS — O99824 Streptococcus B carrier state complicating childbirth: Secondary | ICD-10-CM | POA: Diagnosis present

## 2014-06-04 DIAGNOSIS — Z3A35 35 weeks gestation of pregnancy: Secondary | ICD-10-CM | POA: Diagnosis not present

## 2014-06-04 LAB — CBC
HEMATOCRIT: 35.2 % — AB (ref 36.0–46.0)
HEMATOCRIT: 32.6 % — AB (ref 36.0–46.0)
HEMOGLOBIN: 11.3 g/dL — AB (ref 12.0–15.0)
Hemoglobin: 12.4 g/dL (ref 12.0–15.0)
MCH: 33.7 pg (ref 26.0–34.0)
MCH: 33.2 pg (ref 26.0–34.0)
MCHC: 35.2 g/dL (ref 30.0–36.0)
MCHC: 34.7 g/dL (ref 30.0–36.0)
MCV: 95.7 fL (ref 78.0–100.0)
MCV: 95.9 fL (ref 78.0–100.0)
Platelets: 176 10*3/uL (ref 150–400)
Platelets: 168 10*3/uL (ref 150–400)
RBC: 3.68 MIL/uL — ABNORMAL LOW (ref 3.87–5.11)
RBC: 3.4 MIL/uL — AB (ref 3.87–5.11)
RDW: 14.1 % (ref 11.5–15.5)
RDW: 13.9 % (ref 11.5–15.5)
WBC: 11.6 10*3/uL — ABNORMAL HIGH (ref 4.0–10.5)
WBC: 17.5 10*3/uL — AB (ref 4.0–10.5)

## 2014-06-04 LAB — TYPE AND SCREEN
ABO/RH(D): A POS
ANTIBODY SCREEN: NEGATIVE

## 2014-06-04 MED ORDER — SODIUM CHLORIDE 0.9 % IV SOLN
2.0000 g | Freq: Once | INTRAVENOUS | Status: AC
  Administered 2014-06-04: 2 g via INTRAVENOUS
  Filled 2014-06-04: qty 2000

## 2014-06-04 MED ORDER — ZOLPIDEM TARTRATE 5 MG PO TABS: 5.0000 mg | ORAL_TABLET | Freq: Every evening | ORAL | Status: DC | PRN

## 2014-06-04 MED ORDER — DIPHENHYDRAMINE HCL 50 MG/ML IJ SOLN: 12.5000 mg | INTRAMUSCULAR | Status: DC | PRN

## 2014-06-04 MED ORDER — SODIUM BICARBONATE 8.4 % IV SOLN
INTRAVENOUS | Status: DC | PRN
  Administered 2014-06-04: 5 mL via EPIDURAL

## 2014-06-04 MED ORDER — ONDANSETRON HCL 4 MG/2ML IJ SOLN: 4.0000 mg | INTRAMUSCULAR | Status: DC | PRN

## 2014-06-04 MED ORDER — SIMETHICONE 80 MG PO CHEW: 80.0000 mg | CHEWABLE_TABLET | ORAL | Status: DC | PRN

## 2014-06-04 MED ORDER — FENTANYL 2.5 MCG/ML BUPIVACAINE 1/10 % EPIDURAL INFUSION (WH - ANES)
14.0000 mL/h | INTRAMUSCULAR | Status: DC | PRN
  Administered 2014-06-04: 14 mL/h via EPIDURAL
  Filled 2014-06-04: qty 125

## 2014-06-04 MED ORDER — SENNOSIDES-DOCUSATE SODIUM 8.6-50 MG PO TABS
2.0000 | ORAL_TABLET | ORAL | Status: DC
  Administered 2014-06-04 – 2014-06-05 (×2): 2 via ORAL
  Filled 2014-06-04 (×2): qty 2

## 2014-06-04 MED ORDER — FENTANYL CITRATE (PF) 100 MCG/2ML IJ SOLN
100.0000 ug | INTRAMUSCULAR | Status: DC | PRN
  Administered 2014-06-04: 100 ug via INTRAVENOUS
  Filled 2014-06-04: qty 2

## 2014-06-04 MED ORDER — OXYCODONE-ACETAMINOPHEN 5-325 MG PO TABS: 2.0000 | ORAL_TABLET | ORAL | Status: DC | PRN

## 2014-06-04 MED ORDER — OXYTOCIN 40 UNITS IN LACTATED RINGERS INFUSION - SIMPLE MED
62.5000 mL/h | INTRAVENOUS | Status: DC
  Filled 2014-06-04: qty 1000

## 2014-06-04 MED ORDER — WITCH HAZEL-GLYCERIN EX PADS: 1.0000 "application " | MEDICATED_PAD | CUTANEOUS | Status: DC | PRN

## 2014-06-04 MED ORDER — ONDANSETRON HCL 4 MG PO TABS: 4.0000 mg | ORAL_TABLET | ORAL | Status: DC | PRN

## 2014-06-04 MED ORDER — CITRIC ACID-SODIUM CITRATE 334-500 MG/5ML PO SOLN: 30.0000 mL | ORAL | Status: DC | PRN

## 2014-06-04 MED ORDER — LACTATED RINGERS IV SOLN
500.0000 mL | INTRAVENOUS | Status: DC | PRN
  Administered 2014-06-04: 500 mL via INTRAVENOUS

## 2014-06-04 MED ORDER — LANOLIN HYDROUS EX OINT
TOPICAL_OINTMENT | CUTANEOUS | Status: DC | PRN
Start: 2014-06-04 — End: 2014-06-06

## 2014-06-04 MED ORDER — ACETAMINOPHEN 325 MG PO TABS: 650.0000 mg | ORAL_TABLET | ORAL | Status: DC | PRN

## 2014-06-04 MED ORDER — PRENATAL MULTIVITAMIN CH
1.0000 | ORAL_TABLET | Freq: Every day | ORAL | Status: DC
  Administered 2014-06-05 – 2014-06-06 (×2): 1 via ORAL
  Filled 2014-06-04 (×2): qty 1

## 2014-06-04 MED ORDER — LACTATED RINGERS IV SOLN: INTRAVENOUS | Status: DC

## 2014-06-04 MED ORDER — DIPHENHYDRAMINE HCL 25 MG PO CAPS: 25.0000 mg | ORAL_CAPSULE | Freq: Four times a day (QID) | ORAL | Status: DC | PRN

## 2014-06-04 MED ORDER — LIDOCAINE HCL (PF) 1 % IJ SOLN
INTRAMUSCULAR | Status: DC | PRN
  Administered 2014-06-04 (×2): 4 mL

## 2014-06-04 MED ORDER — IBUPROFEN 600 MG PO TABS
600.0000 mg | ORAL_TABLET | Freq: Four times a day (QID) | ORAL | Status: DC
  Administered 2014-06-04 – 2014-06-06 (×8): 600 mg via ORAL
  Filled 2014-06-04 (×8): qty 1

## 2014-06-04 MED ORDER — OXYTOCIN BOLUS FROM INFUSION: 500.0000 mL | INTRAVENOUS | Status: DC

## 2014-06-04 MED ORDER — DIBUCAINE 1 % RE OINT: 1.0000 "application " | TOPICAL_OINTMENT | RECTAL | Status: DC | PRN

## 2014-06-04 MED ORDER — OXYCODONE-ACETAMINOPHEN 5-325 MG PO TABS
1.0000 | ORAL_TABLET | ORAL | Status: DC | PRN
  Administered 2014-06-04: 1 via ORAL
  Filled 2014-06-04: qty 1

## 2014-06-04 MED ORDER — LIDOCAINE HCL (PF) 1 % IJ SOLN
30.0000 mL | INTRAMUSCULAR | Status: DC | PRN
  Filled 2014-06-04: qty 30

## 2014-06-04 MED ORDER — OXYCODONE-ACETAMINOPHEN 5-325 MG PO TABS
1.0000 | ORAL_TABLET | ORAL | Status: DC | PRN
  Administered 2014-06-04 – 2014-06-05 (×3): 1 via ORAL
  Filled 2014-06-04 (×3): qty 1

## 2014-06-04 MED ORDER — MISOPROSTOL 200 MCG PO TABS: 800.0000 ug | ORAL_TABLET | Freq: Once | ORAL | Status: DC

## 2014-06-04 MED ORDER — MISOPROSTOL 200 MCG PO TABS
ORAL_TABLET | ORAL | Status: AC
Start: 2014-06-04 — End: 2014-06-04
  Administered 2014-06-04: 800 ug via RECTAL
  Filled 2014-06-04: qty 4

## 2014-06-04 MED ORDER — BENZOCAINE-MENTHOL 20-0.5 % EX AERO: 1.0000 "application " | INHALATION_SPRAY | CUTANEOUS | Status: DC | PRN

## 2014-06-04 MED ORDER — PHENYLEPHRINE 40 MCG/ML (10ML) SYRINGE FOR IV PUSH (FOR BLOOD PRESSURE SUPPORT)
80.0000 ug | PREFILLED_SYRINGE | INTRAVENOUS | Status: DC | PRN
  Filled 2014-06-04: qty 20
  Filled 2014-06-04: qty 2

## 2014-06-04 MED ORDER — TETANUS-DIPHTH-ACELL PERTUSSIS 5-2.5-18.5 LF-MCG/0.5 IM SUSP: 0.5000 mL | Freq: Once | INTRAMUSCULAR | Status: DC

## 2014-06-04 MED ORDER — ONDANSETRON HCL 4 MG/2ML IJ SOLN
4.0000 mg | Freq: Four times a day (QID) | INTRAMUSCULAR | Status: DC | PRN
  Administered 2014-06-04: 4 mg via INTRAVENOUS
  Filled 2014-06-04: qty 2

## 2014-06-04 MED ORDER — EPHEDRINE 5 MG/ML INJ
10.0000 mg | INTRAVENOUS | Status: DC | PRN
  Filled 2014-06-04: qty 2

## 2014-06-04 NOTE — Lactation Note (Signed)
This note was copied from the chart of Girl Faron Whitelock. Lactation Consultation Note  Patient Name: Girl Anissa Abbs [id number] Date: 06/04/2014 Reason for consult: Follow-up assessment Reviewed LPT behaviors and policy with Mom. Baby has not latched since after delivery 1 time. Mom reports baby sleepy. Was able to work with latch with baby after diaper change at this visit. Baby latched using breast compression in cross cradle on left breast but had difficulty sustaining depth. Initiated #16 nipple shield and after few attempts baby was able to latch and demonstrated a good suckling pattern, scant amount of colostrum visible in the nipple shield. Encouraged Mom to BF with feeding ques but may need to wake baby to BF, STS. Set up DEBP and encouraged Mom to pump every 3 hours for 15 minutes on preemie setting. Reviewed supplemental guidelines per LPT policy and demonstrated how to finger feed baby supplement using curved tipped syringe. Encouraged Mom to attempt to latch without nipple shield but if baby cannot sustain depth or suckling pattern then use #16 nipple shield as directed. Lactation brochure left for review, advised of OP services and support group. Encouraged to call for assist as needed, questions/concerns.   Maternal Data Has patient been taught Hand Expression?: Yes Does the patient have breastfeeding experience prior to this delivery?: Yes  Feeding Feeding Type: Breast Fed Length of feed: 15 min (off/on)  LATCH Score/Interventions Latch: Repeated attempts needed to sustain latch, nipple held in mouth throughout feeding, stimulation needed to elicit sucking reflex. (initiated #16 nipple shield) Intervention(s): Adjust position;Assist with latch  Audible Swallowing: A few with stimulation Intervention(s): Hand expression  Type of Nipple: Everted at rest and after stimulation (short nipple shafts bilateral)  Comfort (Breast/Nipple): Soft / non-tender     Hold  (Positioning): Assistance needed to correctly position infant at breast and maintain latch. Intervention(s): Breastfeeding basics reviewed;Support Pillows;Position options;Skin to skin  LATCH Score: 7  Lactation Tools Discussed/Used Tools: Nipple [place] Fuel;Pump Nipple shield size: 16 Breast pump type: Double-Electric Breast Pump Pump Review: Setup, frequency, and cleaning;Milk Storage Initiated by:: KG Date initiated:: 06/04/14   Consult Status Consult Status: Follow-up Date: 06/05/14 Follow-up type: In-patient    Katrine Coho 06/04/2014, 10:50 PM

## 2014-06-04 NOTE — H&P (Signed)
LABOR ADMISSION HISTORY AND PHYSICAL  Dana Gonzalez is a 24 y.o. female [phone] with IUP at [redacted]w[redacted]d by 8wk [place] presenting for spontaneous onset preterm labor. She reports +FM, + contractions, No LOF, no VB, no blurry vision, headaches or peripheral edema, and RUQ pain.  She plans on breast feeding. She wanted BTL for birth control but papers never filled. Considering Depo instead.    Dating: By [id number] [place] --->  Estimated Date of Delivery: 07/06/14  Sono:   @[redacted]w[redacted]d , CWD, normal anatomy, cephalic presentation, 5284X, 90% EFW   Prenatal History/Complications: -HRC -seen in MAU multiple times for preterm contractions/labor -BMZ x2 (5/16)  Past Medical History: Past Medical History  Diagnosis Date  . Thyroid disease   . Headache(784.0)   . Infection     UTI    Past Surgical History: Past Surgical History  Procedure Laterality Date  . Vaginal delivery      X 2  . Mouth surgery    . No past surgeries      Obstetrical History: OB History    Gravida Para Term Preterm AB TAB SAB Ectopic Multiple Living   4 3 2 1  0 0 0 0 0 2    -prior stillbirth at 76 weeks  Social History: History   Social History  . Marital Status: Single    Spouse Name: N/A  . Number of Children: N/A  . Years of Education: N/A   Social History Main Topics  . Smoking status: Former Smoker    Types: Cigarettes    Quit date: 05/22/2012  . Smokeless tobacco: Never Used     Comment: Aug 2015  . Alcohol Use: No     Comment: No THC use since age 60  . Drug Use: No     Comment: not since age 18  . Sexual Activity: Yes   Other Topics Concern  . None   Social History Narrative    Family History: Family History  Problem Relation Age of Onset  . Cancer Mother     breast to bone  . Hypertension Father   . Diabetes Maternal Grandmother   . Stroke Maternal Grandmother   . Heart disease Paternal Grandmother     Allergies: No Known Allergies  Prescriptions prior to admission  Medication Sig  Dispense Refill Last Dose  . acetaminophen (TYLENOL) 500 MG tablet Take 1,000 mg by mouth every 6 (six) hours as needed for mild pain or headache.   Past Week at Unknown time  . calcium carbonate (TUMS - DOSED IN MG ELEMENTAL CALCIUM) 500 MG chewable tablet Chew 2 tablets by mouth 2 (two) times daily as needed for indigestion or heartburn.   06/04/2014 at Unknown time  . prenatal vitamin w/FE, FA (PRENATAL 1 + 1) 27-1 MG TABS tablet Take 1 tablet by mouth daily at 12 noon.   06/03/2014 at Unknown time  . NIFEdipine (PROCARDIA-XL/ADALAT CC) 30 MG 24 hr tablet Take 1 tablet (30 mg total) by mouth 2 (two) times daily. (Patient not taking: Reported on 05/31/2014) 7 tablet 0 Not Taking  . oxyCODONE-acetaminophen (PERCOCET/ROXICET) 5-325 MG per tablet Take 1-2 tablets by mouth every 6 (six) hours as needed. 15 tablet 0 Taking     Review of Systems   All systems reviewed and negative except as stated in HPI  BP 134/81 mmHg  Temp(Src) 98.1 F (36.7 C) (Oral)  Resp 20  Ht 5\' 6"  (1.676 m)  Wt 180 lb 3.2 oz (81.738 kg)  BMI 29.10 kg/m2  LMP  (  LMP Unknown) General appearance: alert, cooperative and mild distress Lungs: clear to auscultation bilaterally Heart: regular rate and rhythm Abdomen: soft, non-tender; bowel sounds normal Extremities: Homans sign is negative, no sign of DVT, edema Presentation: cephalic Fetal monitoringBaseline: 140 bpm, Variability: Good {> 6 bpm), Accelerations: Reactive and Decelerations: Absent Uterine activityFrequency: Every 2-3 minutes Dilation: 5 Effacement (%): 80 Station: -2 Exam by:: Dana Rossetti RN   Prenatal labs: ABO, Rh: --/--/A POS (05/24 1305) Antibody: NEG (05/24 1305) Rubella:  Immune RPR: Non Reactive (05/24 1305)  HBsAg: NEGATIVE (12/18 1218)  HIV: NONREACTIVE (04/07 1418)  GBS: Positive (05/17 0000)  1 hr Glucola 89 Genetic screening  mormal Anatomy US normal  Prenatal Transfer Tool  Maternal Diabetes: No Genetic Screening:  Normal Maternal Ultrasounds/Referrals: Normal Fetal Ultrasounds or other Referrals:  None Maternal Substance Abuse:  No Significant Maternal Medications:  None Significant Maternal Lab Results: None  No results found for this or any previous visit (from the past 24 hour(s)).  Patient Active Problem List   Diagnosis Date Noted  . Active labor 05/31/2014  . Group B Streptococcus carrier, +RV culture, currently pregnant 05/25/2014  . Preterm labor 05/23/2014  . Prior pregnancy with fetal demise and current pregnancy   . Supervision of high risk pregnancy, antepartum 12/24/2013    Assessment: Dana Gonzalez is a 24 y.o. [phone] at [redacted]w[redacted]d here for spontaneous onset preterm active labor.   #Labor: In active labor. Will not augment at this time. Expectant management. #Pain: IV pain medications only at this time.  #FWB: Cat 1 #ID: GBS positive - Received Abx at last admission. Order placed for Ampicillin. #MOF: Breast #MOC: Depo vs Paragard #Circ:  Female  Dana Blare, DO 06/04/2014, 7:17 AM PGY-1, [place]

## 2014-06-04 NOTE — Anesthesia Procedure Notes (Signed)
Epidural Patient location during procedure: OB  Staffing Anesthesiologist: Talana Slatten Performed by: anesthesiologist   Preanesthetic Checklist Completed: patient identified, site marked, surgical consent, pre-op evaluation, timeout performed, IV checked, risks and benefits discussed and monitors and equipment checked  Epidural Patient position: sitting Prep: site prepped and draped and DuraPrep Patient monitoring: continuous pulse ox and blood pressure Approach: midline Location: L3-L4 Injection technique: LOR saline  Needle:  Needle type: Tuohy  Needle gauge: 17 G Needle length: 9 cm and 9 Needle insertion depth: 5 cm cm Catheter type: closed end flexible Catheter size: 19 Gauge Catheter at skin depth: 10 cm Test dose: negative  Assessment Events: blood not aspirated, injection not painful, no injection resistance, negative IV test and no paresthesia  Additional Notes Patient identified. Risks/Benefits/Options discussed with patient including but not limited to bleeding, infection, nerve damage, paralysis, failed block, incomplete pain control, headache, blood pressure changes, nausea, vomiting, reactions to medication both or allergic, itching and postpartum back pain. Confirmed with bedside nurse the patient's most recent platelet count. Confirmed with patient that they are not currently taking any anticoagulation, have any bleeding history or any family history of bleeding disorders. Patient expressed understanding and wished to proceed. All questions were answered. Sterile technique was used throughout the entire procedure. Please see nursing notes for vital signs. Test dose was given through epidural catheter and negative prior to continuing to dose epidural or start infusion. Warning signs of high block given to the patient including shortness of breath, tingling/numbness in hands, complete motor block, or any concerning symptoms with instructions to call for help. Patient was  given instructions on fall risk and not to get out of bed. All questions and concerns addressed with instructions to call with any issues or inadequate analgesia.      

## 2014-06-04 NOTE — Anesthesia Preprocedure Evaluation (Signed)
Anesthesia Evaluation  Patient identified by MRN, date of birth, ID band Patient awake    Reviewed: Allergy & Precautions, NPO status , Patient's Chart, lab work & pertinent test results  History of Anesthesia Complications Negative for: history of anesthetic complications  Airway Mallampati: II  TM Distance: >3 FB Neck ROM: Full    Dental no notable dental hx. (+) Dental Advisory Given   Pulmonary former smoker,  breath sounds clear to auscultation  Pulmonary exam normal       Cardiovascular negative cardio ROS Normal cardiovascular examRhythm:Regular Rate:Normal     Neuro/Psych  Headaches, negative psych ROS   GI/Hepatic negative GI ROS, Neg liver ROS,   Endo/Other  negative endocrine ROS  Renal/GU negative Renal ROS  negative genitourinary   Musculoskeletal negative musculoskeletal ROS (+)   Abdominal   Peds negative pediatric ROS (+)  Hematology negative hematology ROS (+)   Anesthesia Other Findings   Reproductive/Obstetrics (+) Pregnancy                             Anesthesia Physical Anesthesia Plan  ASA: II  Anesthesia Plan: Epidural   Post-op Pain Management:    Induction:   Airway Management Planned:   Additional Equipment:   Intra-op Plan:   Post-operative Plan:   Informed Consent: I have reviewed the patients History and Physical, chart, labs and discussed the procedure including the risks, benefits and alternatives for the proposed anesthesia with the patient or authorized representative who has indicated his/her understanding and acceptance.   Dental advisory given  Plan Discussed with: CRNA  Anesthesia Plan Comments:         Anesthesia Quick Evaluation

## 2014-06-05 LAB — CBC
HEMATOCRIT: 32.8 % — AB (ref 36.0–46.0)
Hemoglobin: 11.1 g/dL — ABNORMAL LOW (ref 12.0–15.0)
MCH: 32.5 pg (ref 26.0–34.0)
MCHC: 33.8 g/dL (ref 30.0–36.0)
MCV: 95.9 fL (ref 78.0–100.0)
Platelets: 153 10*3/uL (ref 150–400)
RBC: 3.42 MIL/uL — ABNORMAL LOW (ref 3.87–5.11)
RDW: 14.1 % (ref 11.5–15.5)
WBC: 10.3 10*3/uL (ref 4.0–10.5)

## 2014-06-05 LAB — RPR: RPR Ser Ql: NONREACTIVE

## 2014-06-05 NOTE — Lactation Note (Signed)
This note was copied from the chart of Dana Gonzalez. Lactation Consultation Note  Patient Name: Dana Gonzalez [id number] Date: 06/05/2014 Reason for consult: Follow-up assessment;Late preterm infant Mom reports baby is latching for short times. She is not consistently using the nipple shield she reports this does not seem to help baby sustain a suckling pattern when at the breast. Mom is pumping off/on receiving few drops of colostrum. Supplementing per guidelines with formula. Baby asleep at this visit, recently fed. Encouraged Mom to call with next feeding for LC to assist Mom to keep baby awake at the breast. Mom has [place] but may need WIC loaner at d/c. Referral faxed today. Mom has 42 month old at home and reports she is not sure if BF, supplementing and pumping will be manageable. Discussed with Mom to be flexible with feedings. Discussed some different feeding options. Mom to call for assist.  Maternal Data    Feeding Feeding Type: Formula Length of feed: 4 min  LATCH Score/Interventions                      Lactation Tools Discussed/Used Tools: Pump;Nipple Shields Nipple shield size: 16 Breast pump type: Double-Electric Breast Pump   Consult Status Consult Status: Follow-up Date: 06/05/14 Follow-up type: In-patient    Katrine Coho 06/05/2014, 7:47 PM

## 2014-06-05 NOTE — Anesthesia Postprocedure Evaluation (Signed)
Anesthesia Post Note  Patient: Dana Gonzalez  Procedure(s) Performed: * No procedures listed *  Anesthesia type: Epidural  Patient location: Mother/Baby  Post pain: Pain level controlled  Post assessment: Post-op Vital signs reviewed  Last Vitals:  Filed Vitals:   06/05/14 0608  BP: 105/55  Pulse: 72  Temp: 36.8 C  Resp: 16    Post vital signs: Reviewed  Level of consciousness:alert  Complications: No apparent anesthesia complications

## 2014-06-05 NOTE — Progress Notes (Signed)
Post Partum Day 1 Subjective:  Dana Gonzalez is a 24 y.o. [phone] [redacted]w[redacted]d s/p preterm SVD.  No acute events overnight.  Pt denies problems with ambulating, voiding or po intake.  She denies nausea or vomiting.  Pain is well controlled.  She has had flatus.   Objective: Blood pressure 105/55, pulse 72, temperature 98.2 F (36.8 C), temperature source Oral, resp. rate 16, height 5\' 6"  (1.676 m), weight 180 lb (81.647 kg), SpO2 96 %, unknown if currently breastfeeding.  Physical Exam:  General: alert, cooperative and no distress Lochia:normal flow Chest: CTAB Heart: RRR no m/r/g Abdomen: +BS, soft, nontender,  Uterine Fundus: firm DVT Evaluation: No evidence of DVT seen on physical exam. Extremities: no edema   Recent Labs  06/04/14 1111 06/05/14 0641  HGB 11.3* 11.1*  HCT 32.6* 32.8*    Assessment/Plan:  ASSESSMENT: Dana Gonzalez is a 24 y.o. [id number] [redacted]w[redacted]d s/p preterm SVD  Plan for discharge tomorrow   LOS: 1 day   Dana Gonzalez ROCIO 06/05/2014, 8:50 AM

## 2014-06-06 ENCOUNTER — Ambulatory Visit: Payer: Self-pay

## 2014-06-06 MED ORDER — IBUPROFEN 600 MG PO TABS: 600.0000 mg | ORAL_TABLET | Freq: Four times a day (QID) | ORAL | Status: DC

## 2014-06-06 MED ORDER — DOCUSATE SODIUM 100 MG PO CAPS: 100.0000 mg | ORAL_CAPSULE | Freq: Two times a day (BID) | ORAL | Status: DC

## 2014-06-06 NOTE — Discharge Instructions (Signed)
If you choose to, you may arrange for Depo-Provera injection at anytime AFTER your discharge. Safe in 1st few weeks, and will not interfere with Mirena IUD at 6wk postpartum.  However, this is optional and up to you. If you would like to arrange Depo-Injection, please call [facility] to schedule it.  Postpartum Care After Vaginal Delivery After you deliver your newborn (postpartum period), the usual stay in the hospital is 24-72 hours. If there were problems with your labor or delivery, or if you have other medical problems, you might be in the hospital longer.  While you are in the hospital, you will receive help and instructions on how to care for yourself and your newborn during the postpartum period.  While you are in the hospital:  Be sure to tell your nurses if you have pain or discomfort, as well as where you feel the pain and what makes the pain worse.  If you had an incision made near your vagina (episiotomy) or if you had some tearing during delivery, the nurses may put ice packs on your episiotomy or tear. The ice packs may help to reduce the pain and swelling.  If you are breastfeeding, you may feel uncomfortable contractions of your uterus for a couple of weeks. This is normal. The contractions help your uterus get back to normal size.  It is normal to have some bleeding after delivery.  For the first 1-3 days after delivery, the flow is red and the amount may be similar to a period.  It is common for the flow to start and stop.  In the first few days, you may pass some small clots. Let your nurses know if you begin to pass large clots or your flow increases.  Do not  flush blood clots down the toilet before having the nurse look at them.  During the next 3-10 days after delivery, your flow should become more watery and pink or brown-tinged in color.  Ten to fourteen days after delivery, your flow should be a small amount of yellowish-white discharge.  The  amount of your flow will decrease over the first few weeks after delivery. Your flow may stop in 6-8 weeks. Most women have had their flow stop by 12 weeks after delivery.  You should change your sanitary pads frequently.  Wash your hands thoroughly with soap and water for at least 20 seconds after changing pads, using the toilet, or before holding or feeding your newborn.  You should feel like you need to empty your bladder within the first 6-8 hours after delivery.  In case you become weak, lightheaded, or faint, call your nurse before you get out of bed for the first time and before you take a shower for the first time.  Within the first few days after delivery, your breasts may begin to feel tender and full. This is called engorgement. Breast tenderness usually goes away within 48-72 hours after engorgement occurs. You may also notice milk leaking from your breasts. If you are not breastfeeding, do not stimulate your breasts. Breast stimulation can make your breasts produce more milk.  Spending as much time as possible with your newborn is very important. During this time, you and your newborn can feel close and get to know each other. Having your newborn stay in your room (rooming in) will help to strengthen the bond with your newborn. It will give you time to get to know your newborn and become comfortable caring for your newborn.  Your hormones change after delivery. Sometimes the hormone changes can temporarily cause you to feel sad or tearful. These feelings should not last more than a few days. If these feelings last longer than that, you should talk to your caregiver.  If desired, talk to your caregiver about methods of family planning or contraception.  Talk to your caregiver about immunizations. Your caregiver may want you to have the following immunizations before leaving the hospital:  Tetanus, diphtheria, and pertussis (Tdap) or tetanus and diphtheria (Td) immunization. It is  very important that you and your family (including grandparents) or others caring for your newborn are up-to-date with the Tdap or Td immunizations. The Tdap or Td immunization can help protect your newborn from getting ill.  Rubella immunization.  Varicella (chickenpox) immunization.  Influenza immunization. You should receive this annual immunization if you did not receive the immunization during your pregnancy. Document Released: 10/21/2006 Document Revised: 09/18/2011 Document Reviewed: 08/21/2011 [facility] Patient Information 2015 [place], [place]. This information is not intended to replace advice given to you by your health care provider. Make sure you discuss any questions you have with your health care provider.  Preterm Birth Preterm birth is a birth that happens before 84 weeks of pregnancy. Most pregnancies last about 39-41 weeks. Every week in the womb is important and is beneficial to the health of the infant. Infants born before 57 weeks of pregnancy are at a higher risk for complications. Depending on when the infant was born, he or she may be:  Late preterm. Born between 32 weeks and 37 weeks of pregnancy.  Very preterm. Born at less than 32 weeks of pregnancy.  Extremely preterm. Born at less than 25 weeks of pregnancy. The earlier a baby is born, the more likely the child will have issues related to prematurity. Complications and problems that can be seen in infants born too early include:  Problems breathing (respiratory distress syndrome).  Low birth weight.  Problems feeding.  Sleeping problems.  Yellowing of the skin (jaundice).  Infections such as pneumonia. Babies born very preterm or extremely preterm are at risk for more serious medical issues. These include:  More severe breathing issues.  Eyesight issues.  Brain development issues (intraventricular hemorrhage).  Behavioral and emotional development issues.  Growth and developmental  delays.  Cerebral palsy.  Serious feeding or bowel complications (necrotizing enterocolitis). CAUSES  There are two broad categories of preterm birth.  Spontaneous preterm birth. This is a birth resulting from preterm labor (not medically induced) or preterm premature rupture of membranes (PPROM).  Indicated preterm birth. This is a birth resulting from labor being medically induced due to health, personal, or social reasons. RISK FACTORS Preterm birth may be related to certain medical conditions, lifestyle factors, or demographic factors encountered by the mother or fetus.  Medical conditions include:  Multiple gestations (twins, triplets, and so on).  Infection.  Diabetes.  Heart disease.  Kidney disease.  Cervical or uterine abnormalities.  Being underweight.  High blood pressure or preeclampsia.  Premature rupture of membranes (PROM).  Birth defects in the fetus.  Lifestyle factors include:  Poor prenatal care.  Poor nutrition or anemia.  Cigarette smoking.  Consuming alcohol.  High levels of stress and lack of social or emotional support.  Exposure to chemical or environmental toxins.  Substance abuse.  Demographic factors include:  African-American ethnicity.  Age (younger than 71 or older than 26 years of age).  Low socioeconomic status. Women with a history of preterm labor or  who become pregnant within 18 months of giving birth are also at increased risk for preterm birth. DIAGNOSIS  Your health care provider may request additional tests to diagnose underlying complications resulting from preterm birth. Tests on the infant may include:  Physical exam.  Blood tests.  Chest X-rays.  Heart-lung monitoring. TREATMENT  After birth, special care will be taken to assess any problems or complications for the infant. Supportive care will be provided for the infant. Treatment depends on what problems are present and any complications that develop.  Some preterm infants are cared for in a neonatal intensive care unit. In general, care may include:  Maintaining temperature and oxygen in a clear heated box (baby isolette).  Monitoring the infant's heart rate, breathing, and level of oxygen in the blood.  Monitoring for signs of infection and, if needed, giving IV antibiotic medicine.  Inserting a feeding tube (nose, mouth) or giving IV nutrition if unable to feed.  Inserting a breathing tube (ventilation).  Respiration support (continuous positive airway pressure [CPAP] or oxygen). Treatment will change as the infant builds up strength and is able to breathe and eat on his or her own. For some infants, no special treatment is necessary. Parents may be educated on the potential health risks of prematurity to the infant. HOME CARE INSTRUCTIONS  Understand your infant's special conditions and needs. It may be reassuring to learn about infant CPR.  Monitor your infant in the car seat until he or she grows and matures. Infant car seats can cause breathing difficulties for preterm infants.  Keep your infant warm. Dress your infant in layers and keep him or her away from drafts, especially in cold months of the year.  Wash your hands thoroughly after going to the bathroom or changing a diaper. Late preterm infants may be more prone to infection.  Follow all your health care provider's instructions for providing support and care to your preterm infant.  Get support from organizations and groups that understand your challenges.  Follow up with your infant's health care provider as directed. Prevention There are some things you can do to help lower your risk of having a preterm infant in the future. These include:  Good prenatal care throughout the entire pregnancy. See a health care provider regularly for advice and tests.  Management of underlying medical conditions.  Proper self-care and lifestyle changes.  Proper diet and weight  control.  Watching for signs of various infections. SEEK MEDICAL CARE IF:  Your infant has feeding difficulties.  Your infant has sleeping difficulties.  Your infant has breathing difficulties.  Your infant's skin starts to look yellow.  Your infant shows signs of infection, such as a stuffy nose, fever, crying, or bluish color of the skin. FOR MORE INFORMATION March [employer]: www.marchofdimes.com Prematurity.org: www.prematurity.org Document Released: 03/16/2003 Document Revised: 10/14/2012 Document Reviewed: 07/23/2012 [facility] Patient Information 2015 [place], [place]. This information is not intended to replace advice given to you by your health care provider. Make sure you discuss any questions you have with your health care provider.

## 2014-06-06 NOTE — Lactation Note (Signed)
This note was copied from the chart of Dana Gonzalez. Lactation Consultation Note  Reviewed late preterm care with parents including volume guidelines, decrease stimulation, and keeping baby warm.  Dad read the hand out on late preterm care.  He asked if I thought baby should have raw goat's milk to receive the enzymes.  I reassured him that baby was receiving enzymes from mom's milk.  Mom reports that her milk is increasing in volume.  She reports that BF is going "better" but then stated that the baby's tongue was at the roof of her mouth.  She is not sure if the baby is really eating because she falls asleep at the breast.  Baby had just eaten when I was in the room.  Mom requested follow- up from lactation later today to check the baby's latch.  Patient Name: Dana Gonzalez Date: 06/06/2014     Maternal Data    Feeding Feeding Type: Breast Fed Length of feed: 8 min  LATCH Score/Interventions                      Lactation Tools Discussed/Used     Consult Status      [name] 06/06/2014, 3:23 PM

## 2014-06-06 NOTE — Discharge Summary (Signed)
Obstetric Discharge Summary Reason for Admission: onset of labor, pre-term labor Prenatal Procedures: NST and ultrasound Intrapartum Procedures: spontaneous vaginal delivery and GBS prophylaxis Postpartum Procedures: none Complications-Operative and Postpartum: none, received cytotec 800 PR (EBL 650) HEMOGLOBIN  Date Value Ref Range Status  06/05/2014 11.1* 12.0 - 15.0 g/dL Final  11/24/2012 12.6 g/dL Final   HCT  Date Value Ref Range Status  06/05/2014 32.8* 36.0 - 46.0 % Final  11/24/2012 38 % Final    Discharge Diagnoses: Premature labor, pre=term delivery  Gonzalez Course:  Dana Gonzalez is a 24 y.o. [id number] at [redacted]w[redacted]d who was admitted on 06/04/14 for spontaneous Pre-term labor. Pregnancy was uncomplicated, HRC for h/o prior fetal demise. Maternal GBS positive, received inadequate Ampicillin prophylaxis. Progressed to vaginal delivery within 3 hours (see copied delivery note below). Postpartum course was uncomplicated, tolerating PO and ambulation, pain controlled, bleeding improved, +flatus (no BM), and no barriers to discharge. Plan for continue breast feeding, contraception with Mirena IUD, follow-up in 4-6 weeks for postpartum visit.  Baby anticipated to require at least 3 day hospitalization in nursery for pre-term, additionally 48 hr obs with GBS positive inadequate treatment.    Dana Gonzalez, Dana Gonzalez [[id number]]  Delivery Note At 9:40 AM a viable female was delivered via Vaginal, Spontaneous Delivery (Presentation: ; Occiput Anterior). APGAR: 9, 9; weight pending.  Placenta status: Intact, Spontaneous. Cord: 3 vessels with the following complications: None.  Anesthesia: Epidural  Episiotomy: None Lacerations: None Est. Blood Loss (mL): 651  Mom to postpartum. Baby to Couplet care / Skin to Skin.  Upon arrival patient was complete and pushing. She pushed with good maternal effort to deliver a healthy baby girl. Baby delivered without difficulty, was noted to have good  tone and place on maternal abdomen for oral suctioning, drying and stimulation. Delayed cord clamping performed. Patient with blood loss concerning for placental abruption. FHT was reassuring throughout delivery. Placenta was delivered intact with 3V cord. Pitocin was started and uterus massaged. Patient had continued bleeding and vaginal vault was cleared of clots and placental membrane removed. Bleeding then slowed. Cytotec 800 was placed rectally. Counts of sharps, instruments, and lap pads were all correct. Vaginal canal and perineum was inspected and only with superficial perineum laceration that did not require repair.   Dana Blare, DO 06/04/2014, 10:06 AM PGY-1, [facility]  Patient is a [redacted]w[redacted]d at [redacted]w[redacted]d who was admitted in active preterm labor, pt has received betamethasone within past month. She progressed without augmentation, AROM at 10cm.  I was gloved and present for delivery in its entirety. Second stage of labor progressed, baby delivered after few contractions. no decels during second stage noted. Complications: none Lacerations: perineal abrasion EBL: 651  Pt given 879mcg PR for increased bleeding. I removed about 173mL of clots.   I was called back to room and again removed 170mL of clots with small piece of placenta from fundus. Very firm and minimal bleeding afterwards.  Dana Riches, MD 12:37 PM  ---------------------------------- Physical Exam:  General: alert and cooperative, well-appearing, comfortable, NAD Lochia: appropriate Uterine Fundus: firm DVT Evaluation: No evidence of DVT seen on physical exam. Negative Homan's sign. No cords or calf tenderness. No significant calf/ankle edema.  Discharge Information: Date: 06/06/2014 Activity: pelvic rest Diet: routine Medications: PNV, Ibuprofen and Colace Baby feeding: plans to breastfeed Contraception: Mirena IUD (at [facility] follow[facility]), may get  Depo-Provera sooner if chooses Condition: stable Instructions: refer to practice specific booklet Discharge to: home Follow-up Information    Follow up with  Dana Gonzalez. Schedule an appointment as soon as possible for a visit in 4 weeks.   Specialty:  Obstetrics and Gynecology   Why:  schedule postpartum follow-up by 4-6 weeks   Contact information:   [place] [phone]      Newborn Data:   Dana Gonzalez [[id number]]  Live born unspecified sex  Birth Weight:   APGAR: ,    Dana Gonzalez [[id number]]  Live born female  Birth Weight: 7 lb 13.4 oz (3555 g) APGAR: 9, 9  Anticipated discharge home with mother.  Nobie Putnam, [place], PGY-2 06/06/2014, 7:24 AM  Seen also by me Agree with note Ready for discharge Dana Gonzalez, CNM

## 2014-06-06 NOTE — Lactation Note (Signed)
This note was copied from the chart of Dana Elinora Weigand. Lactation Consultation Note Follow up visit at 60 hours of age.  Mom reports baby is due to eat now.  Mom is feeling more full and pumping up to an ounce with DEBP.  Mom has been using NS for latching baby.  Reviewed with mom proper application.  Baby did not latch well with NS, prefilled with EBM, baby swallowed EBM and did not continue feeding.  Assisted with bottle feeding with a slow flow nipple.  Baby took 31mls of EBM by bottle.  Comfort gels given to mom for sore nipples.  Encouraged mom to continue plan to try latching and to follow feedings with appropriate supplement for hours of age.  Mom is encouraged she is able to pump now and reports baby has not been doing well at breast.  Discussed with mom option of o/p appt. Closer to baby's due date if needed.  Encouraged mom to continue to pump 8 times in 24 hours and offer baby 8 feedings in 24 hours or with feeding cues on demand.  Report given to RN.   Patient Name: Dana Gonzalez [id number] Date: 06/06/2014 Reason for consult: Follow-up assessment;Difficult latch   Maternal Data    Feeding Feeding Type: Breast Fed Length of feed:  (few sucks and nipple shield full of EBM)  LATCH Score/Interventions Latch: Repeated attempts needed to sustain latch, nipple held in mouth throughout feeding, stimulation needed to elicit sucking reflex. Intervention(s): Skin to skin;Teach feeding cues;Waking techniques Intervention(s): Breast compression;Assist with latch;Adjust position  Audible Swallowing: A few with stimulation Intervention(s): Skin to skin  Type of Nipple: Flat  Comfort (Breast/Nipple): Soft / non-tender     Hold (Positioning): Assistance needed to correctly position infant at breast and maintain latch. Intervention(s): Skin to skin;Position options;Support Pillows;Breastfeeding basics reviewed  LATCH Score: 6  Lactation Tools Discussed/Used Nipple shield size:  20   Consult Status Consult Status: Follow-up Date: 06/07/14 Follow-up type: In-patient    Susanne Baumgarner, Justine Null 06/06/2014, 10:31 PM

## 2014-06-07 ENCOUNTER — Other Ambulatory Visit: Payer: Medicaid Other

## 2014-06-08 ENCOUNTER — Telehealth: Payer: Self-pay | Admitting: *Deleted

## 2014-06-08 NOTE — Telephone Encounter (Signed)
Attempted to contact patient regarding FMLA request, no answer, left message for patient to contact clinic.  Pt has appointment on 06/09/14.

## 2014-06-09 ENCOUNTER — Other Ambulatory Visit: Payer: Medicaid Other

## 2014-06-13 ENCOUNTER — Other Ambulatory Visit: Payer: Medicaid Other

## 2014-06-16 ENCOUNTER — Other Ambulatory Visit: Payer: Medicaid Other

## 2014-06-20 ENCOUNTER — Other Ambulatory Visit: Payer: Medicaid Other

## 2014-06-20 ENCOUNTER — Telehealth: Payer: Self-pay | Admitting: *Deleted

## 2014-06-20 NOTE — Telephone Encounter (Addendum)
Attempted to contact patient to inform of the need for patient to come by the office and sign a ROI for FMLA paperwork.  FMLA completed.  After ROI signed, FMLA paperwork can be faxed to [employer].  6/15  1230  Called pt and left message that we need her to call us back regarding important information. Please state whether a detailed message can be left on her voice mail.   DIane Day RNC

## 2014-06-23 ENCOUNTER — Ambulatory Visit (HOSPITAL_COMMUNITY): Payer: Medicaid Other

## 2014-06-23 ENCOUNTER — Other Ambulatory Visit: Payer: Medicaid Other

## 2014-06-24 NOTE — Telephone Encounter (Signed)
Mychart message to patient asking her to come by to sign ROI.

## 2014-07-04 ENCOUNTER — Ambulatory Visit (INDEPENDENT_AMBULATORY_CARE_PROVIDER_SITE_OTHER): Payer: Medicaid Other | Admitting: Obstetrics & Gynecology

## 2014-07-04 ENCOUNTER — Encounter: Payer: Self-pay | Admitting: Obstetrics & Gynecology

## 2014-07-04 VITALS — BP 122/65 | HR 80 | Ht 66.0 in | Wt 165.8 lb

## 2014-07-04 DIAGNOSIS — O09299 Supervision of pregnancy with other poor reproductive or obstetric history, unspecified trimester: Secondary | ICD-10-CM

## 2014-07-04 DIAGNOSIS — Z30430 Encounter for insertion of intrauterine contraceptive device: Secondary | ICD-10-CM | POA: Diagnosis not present

## 2014-07-04 DIAGNOSIS — O0990 Supervision of high risk pregnancy, unspecified, unspecified trimester: Secondary | ICD-10-CM

## 2014-07-04 LAB — POCT PREGNANCY, URINE: PREG TEST UR: NEGATIVE

## 2014-07-04 MED ORDER — LEVONORGESTREL 18.6 MCG/DAY IU IUD
INTRAUTERINE_SYSTEM | Freq: Once | INTRAUTERINE | Status: AC
  Administered 2014-07-04: 1 via INTRAUTERINE

## 2014-07-04 NOTE — Progress Notes (Signed)
Subjective:     Dana Gonzalez is a 24 y.o. [phone] female who presents for a postpartum visit. She is 4 weeks postpartum following a spontaneous vaginal delivery. I have fully reviewed the prenatal and intrapartum course. The delivery was at [redacted]w[redacted]d. Outcome: spontaneous vaginal delivery. Anesthesia: epidural. Postpartum course has been uncomplicated. Baby's course has been uncomplicated. Baby is feeding by both breast and bottle - Similac Neosure. Bleeding staining only. Bowel function is normal. Bladder function is normal. Patient is not sexually active. Contraception method is IUD. Postpartum depression screening: negative.  The following portions of the patient's history were reviewed and updated as appropriate: allergies, current medications, past family history, past medical history, past social history, past surgical history and problem list. Normal pap in 07/2012.  Review of Systems Pertinent items are noted in HPI.   Objective:    BP 122/65 mmHg  Pulse 80  Ht 5\' 6"  (1.676 m)  Wt 165 lb 12.8 oz (75.206 kg)  BMI 26.77 kg/m2  Breastfeeding? Yes  General:  alert and no distress   Breasts:  inspection negative, no nipple discharge or bleeding, no masses or nodularity palpable  Lungs: clear to auscultation bilaterally  Heart:  regular rate and rhythm  Abdomen: soft, non-tender; bowel sounds normal; no masses,  no organomegaly   Vulva:  normal  Vagina: normal vagina, no discharge, exudate, lesion, or erythema  Cervix:  multiparous appearance  Corpus: normal size, contour, position, consistency, mobility, non-tender  Adnexa:  normal adnexa and no mass, fullness, tenderness  Rectal Exam: Not performed.      IUD Insertion Procedure Note Patient identified, informed consent performed, consent signed.   Discussed risks of irregular bleeding, cramping, infection, malpositioning or misplacement of the IUD outside the uterus which may require further procedure such as laparoscopy. Time out  was performed.  Urine pregnancy test negative.  Speculum placed in the vagina.  Cervix visualized.  Cleaned with Betadine x 2.  Grasped anteriorly with a single tooth tenaculum.  Uterus sounded to 7 cm.  Liletta IUD placed per manufacturer's recommendations.  Strings trimmed to 3 cm. Tenaculum was removed, good hemostasis noted.  Patient tolerated procedure well.   Patient was given post-procedure instructions.  She was advised to have backup contraception for one week.  .   Assessment:   Normal postpartum exam. Pap smear not done at today's visit.   Plan:   Contraception: Liletta IUD placed today.  Patient was also asked to check IUD strings periodically and follow up in 4 weeks for IUD check  Verita Schneiders, MD, [place] Attending [place] for [place], [place]

## 2014-08-04 ENCOUNTER — Ambulatory Visit (INDEPENDENT_AMBULATORY_CARE_PROVIDER_SITE_OTHER): Payer: Medicaid Other | Admitting: Obstetrics & Gynecology

## 2014-08-04 ENCOUNTER — Encounter: Payer: Self-pay | Admitting: Obstetrics & Gynecology

## 2014-08-04 VITALS — BP 132/82 | HR 64 | Temp 98.2°F | Ht 66.0 in | Wt 169.0 lb

## 2014-08-04 DIAGNOSIS — Z30431 Encounter for routine checking of intrauterine contraceptive device: Secondary | ICD-10-CM | POA: Diagnosis not present

## 2014-08-04 NOTE — Progress Notes (Signed)
     GYNECOLOGY CLINIC PROGRESS NOTE  History:  24 y.o. [phone] here today for today for IUD string check; Liletta IUD was placed  07/04/14 during her postpartum visit. No complaints about the IUD, denies any abnormal bleeding, vaginal discharge, pelvic pain or problems with intercourse.  Past Medical History  Diagnosis Date  . Thyroid disease   . Headache(784.0)   . Infection     UTI   Past Surgical History  Procedure Laterality Date  . Mouth surgery      The following portions of the patient's history were reviewed and updated as appropriate: allergies, current medications, past family history, past medical history, past social history, past surgical history and problem list. Last pap smear on 07/07/2012 was normal.  Review of Systems:  Pertinent items are noted in HPI.  Objective:  Physical Exam Blood pressure 132/82, pulse 64, temperature 98.2 F (36.8 C), temperature source Oral, height 5\' 6"  (1.676 m), weight 169 lb (76.658 kg), last menstrual period 07/25/2014, not currently breastfeeding. Gen: NAD HEENT: NCAT, EOMI Lungs: Normal respiratory effort Heart: Regular rate Abd: Soft, nontender and nondistended Pelvic: Normal appearing external genitalia; normal appearing vaginal mucosa and cervix.  IUD strings visualized, about 2 cm in length outside cervix.   Assessment & Plan:  Normal IUD check. Patient to keep IUD in place for five years; can come in for removal if she desires pregnancy within the next five years. Routine preventative health maintenance measures emphasized.  Verita Schneiders, MD, [place] Attending [place] for [employer], [place]

## 2015-01-12 ENCOUNTER — Encounter: Payer: Self-pay | Admitting: Internal Medicine

## 2015-01-12 ENCOUNTER — Ambulatory Visit (INDEPENDENT_AMBULATORY_CARE_PROVIDER_SITE_OTHER): Payer: 59 | Admitting: Internal Medicine

## 2015-01-12 VITALS — BP 129/82 | HR 87 | Temp 97.7°F | Ht 66.0 in | Wt 173.0 lb

## 2015-01-12 DIAGNOSIS — H65193 Other acute nonsuppurative otitis media, bilateral: Secondary | ICD-10-CM

## 2015-01-12 DIAGNOSIS — H65191 Other acute nonsuppurative otitis media, right ear: Secondary | ICD-10-CM | POA: Diagnosis not present

## 2015-01-12 DIAGNOSIS — Z803 Family history of malignant neoplasm of breast: Secondary | ICD-10-CM | POA: Diagnosis not present

## 2015-01-12 MED ORDER — AMOXICILLIN 500 MG PO CAPS: 500.0000 mg | ORAL_CAPSULE | Freq: Two times a day (BID) | ORAL | Status: DC

## 2015-01-12 MED FILL — AMOXICILLIN 500 MG CAPSULE: 500 | 7 days supply | Qty: 14 | Fill #0

## 2015-01-12 NOTE — Patient Instructions (Signed)
Dana Gonzalez,   It was nice to meet you today. Please take amoxicillin 500 mg every 12 hours for a week to clear your ear infection.  I have made a referral to genetic counseling to discuss BRCA testing further.  Please make an appointment at your convenience for an annual exam, or sooner as needed.  Best, Dr. Ola Spurr

## 2015-01-13 ENCOUNTER — Encounter: Payer: Self-pay | Admitting: Internal Medicine

## 2015-01-13 DIAGNOSIS — Z803 Family history of malignant neoplasm of breast: Secondary | ICD-10-CM | POA: Insufficient documentation

## 2015-01-13 NOTE — Progress Notes (Signed)
Subjective: Dana Gonzalez is a 25 y.o. female patient of Darci Needle, MD, accompanied by her 2 youngest daughters, presenting to establish care and with complaint of congestion.  Congestion: - Started 5 days ago. Feels a lot of pressure in her head, states it "feels like I'm in a cloud" - Associated with headache. Has tried ibuprofen, which helps.  - Denies fever and cough.  PMH: - Was told she had thyroid disease in the past and was on medication for about a year. Stopped taking it when lost insurance coverage and when rechecked TSH was normal.  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 female in no distress HEENT: No conjunctival injection, erythematous nasal turbinates, MMM, oropharynx non-erythematous, right TM bulging and erythematous, left TM erythematous Cardiac: RRR, S1, S2, no m/r/g Pulm: CTAB, no increased work of breathing Abdomen: +BS, soft, NT, ND Skin: No rashes or lesions, warm and dry Psych: Normal mood and affect, pleasant    Assessment/Plan: Dana Gonzalez is a 25 y.o. female here for congestion and found to have AOM.  Recommended follow-up in July 2017 for annual physical, when PAP smear is due.   Acute otitis media - Prescribed amoxicillin 500 mg BID for 7 days  Family history of breast cancer - Referred to genetic counseling to complete disease pedigree and BRCA testing   Olene Floss, MD [place], PGY-1

## 2015-01-13 NOTE — Assessment & Plan Note (Addendum)
-   Prescribed amoxicillin 500 mg BID for 7 days

## 2015-01-13 NOTE — Assessment & Plan Note (Addendum)
-  Referred to genetic counseling to complete disease pedigree and BRCA testing

## 2015-01-30 ENCOUNTER — Telehealth: Payer: Self-pay | Admitting: Genetic Counselor

## 2015-01-30 NOTE — Telephone Encounter (Signed)
LT MESS REGARDING GENETIC COUNSELING REFERRAL °

## 2015-02-02 ENCOUNTER — Telehealth: Payer: Self-pay | Admitting: Genetic Counselor

## 2015-02-02 NOTE — Telephone Encounter (Signed)
LT MESS REGARDING GENETIC COUNSELING REFERRAL °

## 2015-02-03 ENCOUNTER — Telehealth: Payer: Self-pay | Admitting: Internal Medicine

## 2015-02-03 NOTE — Telephone Encounter (Signed)
Has reached out to pt numerous times for genetic counseling but pt has never responded

## 2015-02-08 ENCOUNTER — Telehealth: Payer: Self-pay | Admitting: Genetic Counselor

## 2015-02-08 NOTE — Telephone Encounter (Signed)
RETURNED PT CALLED AND LT MESS REGARDING GENETIC COUNSELING REFERRAL

## 2015-04-06 ENCOUNTER — Telehealth: Payer: 59 | Admitting: Physician Assistant

## 2015-04-06 DIAGNOSIS — M545 Low back pain: Secondary | ICD-10-CM | POA: Diagnosis not present

## 2015-04-06 MED ORDER — NAPROXEN 500 MG PO TABS: 500.0000 mg | ORAL_TABLET | Freq: Two times a day (BID) | ORAL | Status: DC

## 2015-04-06 MED ORDER — CYCLOBENZAPRINE HCL 10 MG PO TABS: 10.0000 mg | ORAL_TABLET | Freq: Three times a day (TID) | ORAL | Status: DC | PRN

## 2015-04-06 MED FILL — NAPROXEN 500 MG TABLET: 500 | 15 days supply | Qty: 30 | Fill #0

## 2015-04-06 MED FILL — CYCLOBENZAPRINE 10 MG TAB: 10 | 10 days supply | Qty: 30 | Fill #0

## 2015-04-06 NOTE — Progress Notes (Signed)

## 2015-04-19 ENCOUNTER — Ambulatory Visit: Payer: 59 | Admitting: Obstetrics & Gynecology

## 2015-05-01 ENCOUNTER — Other Ambulatory Visit: Payer: Self-pay | Admitting: Physician Assistant

## 2015-05-05 ENCOUNTER — Other Ambulatory Visit: Payer: Self-pay | Admitting: Physician Assistant

## 2015-05-30 ENCOUNTER — Encounter: Payer: Self-pay | Admitting: Internal Medicine

## 2015-06-03 DIAGNOSIS — H5213 Myopia, bilateral: Secondary | ICD-10-CM | POA: Diagnosis not present

## 2015-06-19 ENCOUNTER — Telehealth: Payer: 59 | Admitting: Family

## 2015-06-19 DIAGNOSIS — M546 Pain in thoracic spine: Secondary | ICD-10-CM | POA: Diagnosis not present

## 2015-06-19 MED ORDER — NAPROXEN 500 MG PO TABS: 500.0000 mg | ORAL_TABLET | Freq: Two times a day (BID) | ORAL | Status: DC

## 2015-06-19 MED ORDER — CYCLOBENZAPRINE HCL 10 MG PO TABS: 10.0000 mg | ORAL_TABLET | Freq: Three times a day (TID) | ORAL | Status: DC | PRN

## 2015-06-19 NOTE — Progress Notes (Signed)

## 2015-06-21 ENCOUNTER — Encounter: Payer: Self-pay | Admitting: Internal Medicine

## 2015-06-21 ENCOUNTER — Ambulatory Visit: Payer: 59 | Admitting: Internal Medicine

## 2015-06-21 ENCOUNTER — Telehealth: Payer: Self-pay | Admitting: Internal Medicine

## 2015-06-21 DIAGNOSIS — R5383 Other fatigue: Secondary | ICD-10-CM

## 2015-06-21 NOTE — Telephone Encounter (Signed)
Left message for patient that because of her history of thyroid dysfunction I would place a lab order for TSH. I asked her to please call clinic for a lab appointment.

## 2015-06-27 ENCOUNTER — Other Ambulatory Visit: Payer: 59

## 2015-06-27 DIAGNOSIS — R5383 Other fatigue: Secondary | ICD-10-CM | POA: Diagnosis not present

## 2015-06-27 LAB — TSH: TSH: 2.49 mIU/L

## 2015-06-28 ENCOUNTER — Telehealth: Payer: Self-pay | Admitting: Internal Medicine

## 2015-06-28 NOTE — Telephone Encounter (Signed)
Left message for patient that TSH was normal and that it would be good to see her in clinic if she continues to struggle with exhaustion.

## 2015-07-14 MED FILL — CYCLOBENZAPRINE 10 MG TAB: 10 | 10 days supply | Qty: 30 | Fill #0

## 2015-07-14 MED FILL — NAPROXEN 500 MG TABLET: 500 | 15 days supply | Qty: 30 | Fill #0

## 2015-07-19 ENCOUNTER — Encounter (HOSPITAL_COMMUNITY): Payer: Self-pay | Admitting: Emergency Medicine

## 2015-07-19 ENCOUNTER — Telehealth: Payer: 59 | Admitting: Family

## 2015-07-19 ENCOUNTER — Ambulatory Visit (HOSPITAL_COMMUNITY)
Admission: EM | Admit: 2015-07-19 | Discharge: 2015-07-19 | Disposition: A | Discharge status: Home / Self Care | Payer: 59 | Attending: Family Medicine | Admitting: Family Medicine

## 2015-07-19 DIAGNOSIS — L259 Unspecified contact dermatitis, unspecified cause: Secondary | ICD-10-CM

## 2015-07-19 MED ORDER — PREDNISONE 10 MG PO TABS: 10.0000 mg | ORAL_TABLET | Freq: Every day | ORAL | Status: DC

## 2015-07-19 MED ORDER — PREDNISONE 10 MG PO TABS: ORAL_TABLET | ORAL | Status: DC

## 2015-07-19 NOTE — Discharge Instructions (Signed)

## 2015-07-19 NOTE — ED Notes (Signed)
Seen by frank patrick, pa, only

## 2015-07-19 NOTE — Progress Notes (Signed)
E Visit for Rash  We are sorry that you are not feeling well. Here is how we plan to help!  Based on what you shared with me it looks like you have contact dermatitis.  Contact dermatitis is a skin rash caused by something that touches the skin and causes irritation or inflammation.  Your skin may be red, swollen, dry, cracked, and itch.  The rash should go away in a few days but can last a few weeks.  If you get a rash, it's important to figure out what caused it so the irritant can be avoided in the future. and I have prescribed Prednisone 10 mg daily for 5 days           HOME CARE:   Take cool showers and avoid direct sunlight.  Apply cool compress or wet dressings.  Take a bath in an oatmeal bath.  Sprinkle content of one Aveeno packet under running faucet with comfortably warm water.  Bathe for 15-20 minutes, 1-2 times daily.  Pat dry with a towel. Do not rub the rash.  Use hydrocortisone cream.  Take an antihistamine like Benadryl for widespread rashes that itch.  The adult dose of Benadryl is 25-50 mg by mouth 4 times daily.  Caution:  This type of medication may cause sleepiness.  Do not drink alcohol, drive, or operate dangerous machinery while taking antihistamines.  Do not take these medications if you have prostate enlargement.  Read package instructions thoroughly on all medications that you take.  GET HELP RIGHT AWAY IF:   Symptoms don't go away after treatment.  Severe itching that persists.  If you rash spreads or swells.  If you rash begins to smell.  If it blisters and opens or develops a yellow-brown crust.  You develop a fever.  You have a sore throat.  You become short of breath.  MAKE SURE YOU:  Understand these instructions. Will watch your condition. Will get help right away if you are not doing well or get worse.  Thank you for choosing an e-visit. Your e-visit answers were reviewed by a board certified advanced clinical practitioner to  complete your personal care plan. Depending upon the condition, your plan could have included both over the counter or prescription medications. Please review your pharmacy choice. Be sure that the pharmacy you have chosen is open so that you can pick up your prescription now.  If there is a problem you may message your provider in MyChart to have the prescription routed to another pharmacy. Your safety is important to us. If you have drug allergies check your prescription carefully.  For the next 24 hours, you can use MyChart to ask questions about today's visit, request a non-urgent call back, or ask for a work or school excuse from your e-visit provider. You will get an email in the next two days asking about your experience. I hope that your e-visit has been valuable and will speed your recovery.     

## 2015-07-19 NOTE — ED Provider Notes (Signed)
CSN: [id number]     Arrival date & time 07/19/15  1949 History   First MD Initiated Contact with Patient 07/19/15 2030     No chief complaint on file.  (Consider location/radiation/quality/duration/timing/severity/associated sxs/prior Treatment) HPI History obtained from patient:  Pt presents with the cc of:  Rash Duration of symptoms: 2 days Treatment prior to arrival: Benadryl without relief of symptoms Context: Sudden onset of contact dermatitis upper and lower extremities. No shortness of breath Other symptoms include: None Pain score: 0, just itching FAMILY HISTORY: Hypertension    Past Medical History  Diagnosis Date  . Headache(784.0)   . Infection     UTI  . Thyroid disease   . GERD (gastroesophageal reflux disease)    Past Surgical History  Procedure Laterality Date  . Mouth surgery     Family History  Problem Relation Age of Onset  . Cancer Mother     breast to bone  . Hypertension Father   . Diabetes Maternal Grandmother   . Stroke Maternal Grandmother   . Heart disease Paternal Grandmother    Social History  Substance Use Topics  . Smoking status: Former Smoker    Types: Cigarettes    Quit date: 05/22/2012  . Smokeless tobacco: Never Used     Comment: Aug 2015  . Alcohol Use: No     Comment: No THC use since age 59   OB History    Gravida Para Term Preterm AB TAB SAB Ectopic Multiple Living   4 4 2 2  0 0 0 0 0 3     Review of Systems  Denies: HEADACHE, NAUSEA, ABDOMINAL PAIN, CHEST PAIN, CONGESTION, DYSURIA, SHORTNESS OF BREATH  Allergies  Review of patient's allergies indicates no known allergies.  Home Medications   Prior to Admission medications   Medication Sig Start Date End Date Taking? Authorizing Provider  amoxicillin (AMOXIL) 500 MG capsule Take 1 capsule (500 mg total) by mouth 2 (two) times daily. 01/12/15   Hillary Corinda Gubler, MD  calcium carbonate (TUMS - DOSED IN MG ELEMENTAL CALCIUM) 500 MG chewable tablet Chew 2 tablets  by mouth 2 (two) times daily as needed for indigestion or heartburn.    Historical Provider, MD  cyclobenzaprine (FLEXERIL) 10 MG tablet Take 1 tablet (10 mg total) by mouth 3 (three) times daily as needed for muscle spasms. 06/19/15   Sharion Balloon, FNP  naproxen (NAPROSYN) 500 MG tablet Take 1 tablet (500 mg total) by mouth 2 (two) times daily with a meal. 06/19/15   Sharion Balloon, FNP  predniSONE (DELTASONE) 10 MG tablet Take 1 tablet (10 mg total) by mouth daily with breakfast. 07/19/15   Sharion Balloon, FNP  predniSONE (DELTASONE) 10 MG tablet Sig: 4 tables once a day for 3 days, 3 tablets once a day X3 days, 2 tablets a day for 3 days, 1 tablet a day for 3 days. 07/19/15   Konrad Felix, PA   Meds Ordered and Administered this Visit  Medications - No data to display  BP 133/91 mmHg  Pulse 91  Temp(Src) 98.5 F (36.9 C) (Oral)  Resp 16  SpO2 100% No data found.   Physical Exam NURSES NOTES AND VITAL SIGNS REVIEWED. CONSTITUTIONAL: Well developed, well nourished, no acute distress HEENT: normocephalic, atraumatic EYES: Conjunctiva normal NECK:normal ROM, supple, no adenopathy PULMONARY:No respiratory distress, normal effort ABDOMINAL: Soft, ND, NT BS+, No CVAT MUSCULOSKELETAL: Normal ROM of all extremities,  SKIN: warm and dry contact derm like rash arms  and legs.  PSYCHIATRIC: Mood and affect, behavior are normal  ED Course  Procedures (including critical care time)  Labs Review Labs Reviewed - No data to display  Imaging Review No results found.   Visual Acuity Review  Right Eye Distance:   Left Eye Distance:   Bilateral Distance:    Right Eye Near:   Left Eye Near:    Bilateral Near:         MDM   1. Contact dermatitis     Patient is reassured that there are no issues that require transfer to higher level of care at this time or additional tests. Patient is advised to continue home symptomatic treatment. Patient is advised that if there are new or  worsening symptoms to attend the emergency department, contact primary care provider, or return to UC. Instructions of care provided discharged home in stable condition.    THIS NOTE WAS GENERATED USING A VOICE RECOGNITION SOFTWARE PROGRAM. ALL REASONABLE EFFORTS  WERE MADE TO PROOFREAD THIS DOCUMENT FOR ACCURACY.  I have verbally reviewed the discharge instructions with the patient. A printed AVS was given to the patient.  All questions were answered prior to discharge.      Konrad Felix, [place] 07/19/15 2048

## 2015-07-20 MED FILL — predniSONE 10 MG TABS: 10 | 5 days supply | Qty: 5 | Fill #0

## 2015-07-27 ENCOUNTER — Encounter: Payer: Self-pay | Admitting: Obstetrics & Gynecology

## 2015-07-27 ENCOUNTER — Encounter: Payer: Self-pay | Admitting: Internal Medicine

## 2015-07-27 ENCOUNTER — Ambulatory Visit (INDEPENDENT_AMBULATORY_CARE_PROVIDER_SITE_OTHER): Payer: 59 | Admitting: Obstetrics & Gynecology

## 2015-07-27 VITALS — BP 117/72 | HR 73 | Ht 67.0 in | Wt 195.2 lb

## 2015-07-27 DIAGNOSIS — Z124 Encounter for screening for malignant neoplasm of cervix: Secondary | ICD-10-CM | POA: Diagnosis not present

## 2015-07-27 DIAGNOSIS — G43009 Migraine without aura, not intractable, without status migrainosus: Secondary | ICD-10-CM

## 2015-07-27 DIAGNOSIS — Z803 Family history of malignant neoplasm of breast: Secondary | ICD-10-CM

## 2015-07-27 DIAGNOSIS — Z01419 Encounter for gynecological examination (general) (routine) without abnormal findings: Secondary | ICD-10-CM | POA: Diagnosis not present

## 2015-07-27 MED ORDER — SUMATRIPTAN SUCCINATE 100 MG PO TABS: 100.0000 mg | ORAL_TABLET | Freq: Once | ORAL | Status: DC | PRN

## 2015-07-27 MED FILL — SUMATRIPTAN SUCC 100 MG TAB: 100 | 30 days supply | Qty: 9 | Fill #0

## 2015-07-27 NOTE — Progress Notes (Signed)
GYNECOLOGY CLINIC ANNUAL PREVENTATIVE CARE ENCOUNTER NOTE  Subjective:   Dana Gonzalez is a 25 y.o. [phone] female here for a routine annual gynecologic exam.  Current complaints: severe migraines 5/7 days a week, not alleviated by NSAIDs or other OTC methods.   Denies abnormal vaginal bleeding, discharge, pelvic pain, problems with intercourse or other gynecologic concerns.    Gynecologic History Patient's last menstrual period was 07/22/2015. Contraception: Liletta  IUD Last Pap: 2014. Results were: normal  Obstetric History OB History  Gravida Para Term Preterm AB SAB TAB Ectopic Multiple Living  _0 0 0 0 0 0 3    # Outcome Date GA Lbr Len/2nd Weight Sex Delivery Anes PTL Lv  4 Preterm 06/04/14 86w3d05:04 / 00:06 7 lb 13.4 oz (3.555 kg) F Vag-Spont EPI  Y  3 Term 02/07/13 360w1d2:26 / 00:37 8 lb 15.6 oz (4.07 kg) F Vag-Spont EPI  Y  2 Term 05/18/08 3813w0d lb 6 oz (3.799 kg) F Vag-Spont EPI  Y  1 Preterm 10/22/06 32w51w0dVag-Spont Local  FD     Comments: amniotic band around umbilical cord      Past Medical History  Diagnosis Date  . Headache(784.0)   . Infection     UTI  . Thyroid disease   . GERD (gastroesophageal reflux disease)     Past Surgical History  Procedure Laterality Date  . Mouth surgery      Current Outpatient Prescriptions on File Prior to Visit  Medication Sig Dispense Refill  . naproxen (NAPROSYN) 500 MG tablet Take 1 tablet (500 mg total) by mouth 2 (two) times daily with a meal. (Patient not taking: Reported on 07/27/2015) 30 tablet 0   No current facility-administered medications on file prior to visit.    No Known Allergies  Social History   Social History  . Marital Status: Single    Spouse Name: N/A  . Number of Children: N/A  . Years of Education: N/A   Occupational History  . Not on file.   Social History Main Topics  . Smoking status: Former Smoker    Types: Cigarettes    Quit date: 05/22/2012  . Smokeless  tobacco: Never Used     Comment: Aug 2015  . Alcohol Use: No     Comment: No THC use since age 67  72Drug Use: No     Comment: not since age 67  6Sexual Activity: Yes    Birth Control/ Protection: IUD   Other Topics Concern  . Not on file   Social History Narrative    Family History  Problem Relation Age of Onset  . Cancer Mother     breast to bone  . Hypertension Father   . Diabetes Maternal Grandmother   . Stroke Maternal Grandmother   . Heart disease Paternal Grandmother     The following portions of the patient's history were reviewed and updated as appropriate: allergies, current medications, past family history, past medical history, past social history, past surgical history and problem list.  Review of Systems Pertinent items noted in HPI and remainder of comprehensive ROS otherwise negative.   Objective:  BP 117/72 mmHg  Pulse 73  Ht _1  (1.702 m)  Wt 195 lb 3.2 oz (88.542 kg)  BMI 30.57 kg/m2  LMP 07/22/2015 CONSTITUTIONAL: Well-developed, well-nourished female in no acute distress.  HENT:  Normocephalic, atraumatic, External right and left ear normal. Oropharynx is clear and moist  EYES: Conjunctivae and EOM are normal. Pupils are equal, round, and reactive to light. No scleral icterus.  NECK: Normal range of motion, supple, no masses.  Normal thyroid.  SKIN: Skin is warm and dry. No rash noted. Not diaphoretic. No erythema. No pallor. NEUROLOGIC: Alert and oriented to person, place, and time. Normal reflexes, muscle tone coordination. No cranial nerve deficit noted. PSYCHIATRIC: Normal mood and affect. Normal behavior. Normal judgment and thought content. CARDIOVASCULAR: Normal heart rate noted, regular rhythm RESPIRATORY: Clear to auscultation bilaterally. Effort and breath sounds normal, no problems with respiration noted. BREASTS: Symmetric in size. No masses, skin changes, nipple drainage, or lymphadenopathy. ABDOMEN: Soft, normal bowel sounds, no  distention noted.  No tenderness, rebound or guarding.  PELVIC: Normal appearing external genitalia; normal appearing vaginal mucosa and cervix.  No abnormal discharge noted.  Pap smear obtained.  Normal uterine size, no other palpable masses, no uterine or adnexal tenderness. MUSCULOSKELETAL: Normal range of motion. No tenderness.  No cyanosis, clubbing, or edema.  2+ distal pulses.   Assessment:  Annual gynecologic examination with pap smear FH of breast cancer in first degree relative Migraines   Plan:  Will follow up results of pap smear and manage accordingly. Imitrex prescribed for migraines, also referred to Headache specialist Patient will consider BRCA testing given strong family history; will start imaging in her 63s Routine preventative health maintenance measures emphasized. Please refer to After Visit Summary for other counseling recommendations.    Verita Schneiders, MD, [place] Attending Obstetrician & Gynecologist, [place] for [facility]

## 2015-07-27 NOTE — Patient Instructions (Addendum)
[facility] for BRCA testing  Representative Dana Gonzalez  [phone]   Migraine Headache A migraine headache is an intense, throbbing pain on one or both sides of your head. A migraine can last for 30 minutes to several hours. CAUSES  The exact cause of a migraine headache is not always known. However, a migraine may be caused when nerves in the brain become irritated and release chemicals that cause inflammation. This causes pain. Certain things may also trigger migraines, such as:  Alcohol.  Smoking.  Stress.  Menstruation.  Aged cheeses.  Foods or drinks that contain nitrates, glutamate, aspartame, or tyramine.  Lack of sleep.  Chocolate.  Caffeine.  Hunger.  Physical exertion.  Fatigue.  Medicines used to treat chest pain (nitroglycerine), birth control pills, estrogen, and some blood pressure medicines. SIGNS AND SYMPTOMS  Pain on one or both sides of your head.  Pulsating or throbbing pain.  Severe pain that prevents daily activities.  Pain that is aggravated by any physical activity.  Nausea, vomiting, or both.  Dizziness.  Pain with exposure to bright lights, loud noises, or activity.  General sensitivity to bright lights, loud noises, or smells. Before you get a migraine, you may get warning signs that a migraine is coming (aura). An aura may include:  Seeing flashing lights.  Seeing bright spots, halos, or zigzag lines.  Having tunnel vision or blurred vision.  Having feelings of numbness or tingling.  Having trouble talking.  Having muscle weakness. DIAGNOSIS  A migraine headache is often diagnosed based on:  Symptoms.  Physical exam.  A CT scan or MRI of your head. These imaging tests cannot diagnose migraines, but they can help rule out other causes of headaches. TREATMENT Medicines may be given for pain and nausea. Medicines can also be given to help prevent recurrent migraines.  HOME CARE INSTRUCTIONS  Only take  over-the-counter or prescription medicines for pain or discomfort as directed by your health care Dana Gonzalez. The use of long-term narcotics is not recommended.  Lie down in a dark, quiet room when you have a migraine.  Keep a journal to find out what may trigger your migraine headaches. For example, write down:  What you eat and drink.  How much sleep you get.  Any change to your diet or medicines.  Limit alcohol consumption.  Quit smoking if you smoke.  Get 7-9 hours of sleep, or as recommended by your health care Dana Gonzalez.  Limit stress.  Keep lights dim if bright lights bother you and make your migraines worse. SEEK IMMEDIATE MEDICAL CARE IF:   Your migraine becomes severe.  You have a fever.  You have a stiff neck.  You have vision loss.  You have muscular weakness or loss of muscle control.  You start losing your balance or have trouble walking.  You feel faint or pass out.  You have severe symptoms that are different from your first symptoms. MAKE SURE YOU:   Understand these instructions.  Will watch your condition.  Will get help right away if you are not doing well or get worse.   This information is not intended to replace advice given to you by your health care Dana Gonzalez. Make sure you discuss any questions you have with your health care Dana Gonzalez.   Document Released: 12/24/2004 Document Revised: 01/14/2014 Document Reviewed: 08/31/2012 Elsevier Interactive Patient Education [employer].

## 2015-07-31 LAB — CYTOLOGY - PAP

## 2015-08-01 ENCOUNTER — Encounter: Payer: Self-pay | Admitting: Internal Medicine

## 2015-08-01 ENCOUNTER — Ambulatory Visit (INDEPENDENT_AMBULATORY_CARE_PROVIDER_SITE_OTHER): Payer: 59 | Admitting: Internal Medicine

## 2015-08-01 VITALS — BP 124/83 | HR 80 | Temp 98.4°F | Ht 67.0 in | Wt 196.4 lb

## 2015-08-01 DIAGNOSIS — R5382 Chronic fatigue, unspecified: Secondary | ICD-10-CM | POA: Diagnosis not present

## 2015-08-01 DIAGNOSIS — R635 Abnormal weight gain: Secondary | ICD-10-CM

## 2015-08-01 NOTE — Progress Notes (Signed)
[facility] Progress Note  Subjective:  Dana Gonzalez is a 24-y/o female who presents for complaint of fatigue and weight gain. Patient reports she had been on thyroid medication in the past and had requested TSH testing last month, which resulted WNL. Patient has had weight gain of about 30 lbs since birth of her daughter over a year ago. Weight at delivery was 180 lbs then 169 2 months after delivery. She weighs 196 lbs today. She has had 3 children and did not have weight gain like this after her last pregnancies. She says she is able to function at work but comes home and feels completely drained. She denies trouble with mood or trouble sleeping. Unable to say if she has had a change in menstruation, as she has not had a regular period with IUD placed after her last pregnancy. She drinks a lot of caffeine. She does not eat much red meat. She has a diet high in carbohydrates. She denies regular physical activity but sometimes takes her children on hikes with her husband. Other associated symptom is frequent headaches but these have improved after getting a prescription for imitrex from her OB last week.  ROS: No visual changes, no falls or weakness, no heavy vaginal bleeding, no urinary frequency  Past Medical History:  Diagnosis Date  . GERD (gastroesophageal reflux disease)   . Headache(784.0)   . Infection    UTI  . Thyroid disease     No Known Allergies  Social: Former smoker  Objective: Blood pressure 124/83, pulse 80, temperature 98.4 F (36.9 C), temperature source Oral, height 5\' 7"  (1.702 m), weight 196 lb 6.4 oz (89.1 kg), last menstrual period 07/22/2015, not currently breastfeeding. Constitutional: Well-appearing female, in NAD HENT: Small, palpable thyroid, symmetric and smooth Cardiovascular: RRR, S1, S2, no m/r/g.  Pulmonary/Chest: Effort normal and breath sounds normal. No respiratory distress.  Abdominal: Soft. +BS, NT, ND, no rebound or guarding.   Musculoskeletal: Normal ROM.  Neurological: AOx3, no focal deficits. Psychiatric: Normal mood and affect.  Vitals reviewed  Assessment/Plan: Weight gain - Suspect 2/2 poor diet and lack of exercise.  - Recommended keeping food diary to better assess caloric intake - Will obtain lipid panel and hgb A1c screening, per patient request as have not been performed recently - Has had recent TSH that was WNL  Fatigue - Suspect high carbohydrate diet, stress of raising 3 young children while also working, and lack of physical activity contributing - Will order CBC to rule-out anemia  Follow-up in next 1-2 months to follow-up symptoms.  Olene Floss, MD [place], PGY-2

## 2015-08-01 NOTE — Patient Instructions (Signed)
Ms. Kurzawa,  Please return at your convenience to test for anemia.  Keeping a food log may provide helpful information regarding possible impact of diet on weight gain. The website http://vang.com/ is a good resource for calorie content.  Please follow-up after seeing the headache doctor to review your food log and see how you are doing with your migraines.  I will call you with results of your bloodwork once you are able to get that done.  Best, Dr. Ola Spurr

## 2015-08-02 ENCOUNTER — Encounter: Payer: Self-pay | Admitting: *Deleted

## 2015-08-02 ENCOUNTER — Telehealth: Payer: Self-pay | Admitting: *Deleted

## 2015-08-02 NOTE — Telephone Encounter (Signed)
Received message left on nurse voicemail by patient today 08/02/15 at 0948.  Patient requests test results from last Thursday 07/27/15.  Patient had pap smear that is normal on 07/27/15.  Patient has active My Chart account.  Will send My Chart message to patient.

## 2015-08-06 DIAGNOSIS — R5383 Other fatigue: Secondary | ICD-10-CM | POA: Insufficient documentation

## 2015-08-06 DIAGNOSIS — R635 Abnormal weight gain: Secondary | ICD-10-CM | POA: Insufficient documentation

## 2015-08-06 NOTE — Assessment & Plan Note (Signed)
-   Suspect high carbohydrate diet, stress of raising 3 young children while also working, and lack of physical activity contributing - Will order CBC to rule-out anemia

## 2015-08-06 NOTE — Assessment & Plan Note (Signed)
-   Suspect 2/2 poor diet and lack of exercise.  - Recommended keeping food diary to better assess caloric intake - Will obtain lipid panel and hgb A1c screening, per patient request as have not been performed recently - Has had recent TSH that was WNL

## 2015-08-07 ENCOUNTER — Other Ambulatory Visit: Payer: 59

## 2015-08-08 ENCOUNTER — Other Ambulatory Visit (INDEPENDENT_AMBULATORY_CARE_PROVIDER_SITE_OTHER): Payer: 59

## 2015-08-08 DIAGNOSIS — R635 Abnormal weight gain: Secondary | ICD-10-CM

## 2015-08-08 DIAGNOSIS — R5382 Chronic fatigue, unspecified: Secondary | ICD-10-CM | POA: Diagnosis not present

## 2015-08-08 LAB — CBC WITH DIFFERENTIAL/PLATELET
BASOS PCT: 0 %
Basophils Absolute: 0 cells/uL (ref 0–200)
Eosinophils Absolute: 219 cells/uL (ref 15–500)
Eosinophils Relative: 3 %
HEMATOCRIT: 43 % (ref 35.0–45.0)
Hemoglobin: 14.8 g/dL (ref 11.7–15.5)
LYMPHS PCT: 50 %
Lymphs Abs: 3650 cells/uL (ref 850–3900)
MCH: 31.4 pg (ref 27.0–33.0)
MCHC: 34.4 g/dL (ref 32.0–36.0)
MCV: 91.3 fL (ref 80.0–100.0)
MONO ABS: 438 {cells}/uL (ref 200–950)
MONOS PCT: 6 %
MPV: 10 fL (ref 7.5–12.5)
NEUTROS ABS: 2993 {cells}/uL (ref 1500–7800)
Neutrophils Relative %: 41 %
PLATELETS: 258 10*3/uL (ref 140–400)
RBC: 4.71 MIL/uL (ref 3.80–5.10)
RDW: 13.5 % (ref 11.0–15.0)
WBC: 7.3 10*3/uL (ref 3.8–10.8)

## 2015-08-08 LAB — POCT GLYCOSYLATED HEMOGLOBIN (HGB A1C): Hemoglobin A1C: 4.8

## 2015-08-09 LAB — LIPID PANEL
CHOL/HDL RATIO: 4.9 ratio (ref <=5.0)
CHOLESTEROL: 171 mg/dL (ref 125–200)
HDL: 35 mg/dL — ABNORMAL LOW (ref >=46)
LDL Cholesterol: 83 mg/dL (ref <130)
Triglycerides: 266 mg/dL — ABNORMAL HIGH (ref <150)
VLDL: 53 mg/dL — ABNORMAL HIGH (ref <30)

## 2015-08-11 ENCOUNTER — Telehealth: Payer: Self-pay | Admitting: Internal Medicine

## 2015-08-11 NOTE — Telephone Encounter (Signed)
Left message for patient informing her of recent lab results. HDL was low. A1c 4.8. No anemia to explain her increased fatigue. Offered nutrition appointments mornings of 17, 24, & 31 if she is interested. Asked her to call back with questions or request for nutrition appointment.

## 2015-08-11 NOTE — Telephone Encounter (Signed)
Pt is calling for her lab results. jw °

## 2015-08-15 ENCOUNTER — Telehealth: Payer: Self-pay | Admitting: Internal Medicine

## 2015-08-15 NOTE — Telephone Encounter (Signed)
Pt would like to know if her snl paperwork has been filled out yet. Please advise. Thanks! ep

## 2015-08-16 NOTE — Telephone Encounter (Signed)
LMOVM for pt to call us back. Deseree Blount, CMA  

## 2015-08-16 NOTE — Telephone Encounter (Signed)
Yes, I placed the FMLA paperwork in the fax pile on 08/01/15.

## 2015-08-17 NOTE — Telephone Encounter (Signed)
Resent FMLA papers again. Pt aware. Deseree Kennon Holter, CMA

## 2015-08-23 MED FILL — AMOXICILLIN 500 MG CAPSULE: 500 | 10 days supply | Qty: 30 | Fill #0

## 2015-08-23 MED FILL — HYDROCODON-APAP 5-325: 5-325 | 2 days supply | Qty: 6 | Fill #0

## 2015-08-25 ENCOUNTER — Encounter: Payer: Self-pay | Admitting: Physician Assistant

## 2015-08-25 ENCOUNTER — Ambulatory Visit (INDEPENDENT_AMBULATORY_CARE_PROVIDER_SITE_OTHER): Payer: 59 | Admitting: Physician Assistant

## 2015-08-25 VITALS — BP 126/85 | HR 88 | Resp 18 | Ht 67.0 in | Wt 198.0 lb

## 2015-08-25 DIAGNOSIS — M62838 Other muscle spasm: Secondary | ICD-10-CM | POA: Diagnosis not present

## 2015-08-25 DIAGNOSIS — G4440 Drug-induced headache, not elsewhere classified, not intractable: Secondary | ICD-10-CM | POA: Diagnosis not present

## 2015-08-25 DIAGNOSIS — G43009 Migraine without aura, not intractable, without status migrainosus: Secondary | ICD-10-CM

## 2015-08-25 MED ORDER — BACLOFEN 10 MG PO TABS: 10.0000 mg | ORAL_TABLET | Freq: Three times a day (TID) | ORAL | 0 refills | Status: DC

## 2015-08-25 MED ORDER — TOPIRAMATE 25 MG PO TABS: 75.0000 mg | ORAL_TABLET | Freq: Every day | ORAL | 2 refills | Status: DC

## 2015-08-25 MED FILL — BACLOFEN 10 MG TABLET: 10 | 10 days supply | Qty: 30 | Fill #0

## 2015-08-25 MED FILL — CLINDAMYCIN HCL 300 MG CAPS: 300 | 10 days supply | Qty: 40 | Fill #0

## 2015-08-25 MED FILL — TOPIRAMATE 25 MG TABLET: 25 | 30 days supply | Qty: 90 | Fill #0

## 2015-08-25 NOTE — Patient Instructions (Signed)
Migraine Headache A migraine headache is an intense, throbbing pain on one or both sides of your head. A migraine can last for 30 minutes to several hours. CAUSES  The exact cause of a migraine headache is not always known. However, a migraine may be caused when nerves in the brain become irritated and release chemicals that cause inflammation. This causes pain. Certain things may also trigger migraines, such as:  Alcohol.  Smoking.  Stress.  Menstruation.  Aged cheeses.  Foods or drinks that contain nitrates, glutamate, aspartame, or tyramine.  Lack of sleep.  Chocolate.  Caffeine.  Hunger.  Physical exertion.  Fatigue.  Medicines used to treat chest pain (nitroglycerine), birth control pills, estrogen, and some blood pressure medicines. SIGNS AND SYMPTOMS  Pain on one or both sides of your head.  Pulsating or throbbing pain.  Severe pain that prevents daily activities.  Pain that is aggravated by any physical activity.  Nausea, vomiting, or both.  Dizziness.  Pain with exposure to bright lights, loud noises, or activity.  General sensitivity to bright lights, loud noises, or smells. Before you get a migraine, you may get warning signs that a migraine is coming (aura). An aura may include:  Seeing flashing lights.  Seeing bright spots, halos, or zigzag lines.  Having tunnel vision or blurred vision.  Having feelings of numbness or tingling.  Having trouble talking.  Having muscle weakness. DIAGNOSIS  A migraine headache is often diagnosed based on:  Symptoms.  Physical exam.  A CT scan or MRI of your head. These imaging tests cannot diagnose migraines, but they can help rule out other causes of headaches. TREATMENT Medicines may be given for pain and nausea. Medicines can also be given to help prevent recurrent migraines.  HOME CARE INSTRUCTIONS  Only take over-the-counter or prescription medicines for pain or discomfort as directed by your  health care provider. The use of long-term narcotics is not recommended.  Lie down in a dark, quiet room when you have a migraine.  Keep a journal to find out what may trigger your migraine headaches. For example, write down:  What you eat and drink.  How much sleep you get.  Any change to your diet or medicines.  Limit alcohol consumption.  Quit smoking if you smoke.  Get 7-9 hours of sleep, or as recommended by your health care provider.  Limit stress.  Keep lights dim if bright lights bother you and make your migraines worse. SEEK IMMEDIATE MEDICAL CARE IF:   Your migraine becomes severe.  You have a fever.  You have a stiff neck.  You have vision loss.  You have muscular weakness or loss of muscle control.  You start losing your balance or have trouble walking.  You feel faint or pass out.  You have severe symptoms that are different from your first symptoms. MAKE SURE YOU:   Understand these instructions.  Will watch your condition.  Will get help right away if you are not doing well or get worse.   This information is not intended to replace advice given to you by your health care provider. Make sure you discuss any questions you have with your health care provider.   Document Released: 12/24/2004 Document Revised: 01/14/2014 Document Reviewed: 08/31/2012 Elsevier Interactive Patient Education 2016 [employer]. Analgesic Rebound Headaches An analgesic rebound headache is a headache that returns after pain medicine (analgesic) that was taken to treat the initial headache wears off. People who suffer from tension, migraine, or cluster headaches  are at risk for developing rebound headaches. Any type of primary headache can return as a rebound headache if you regularly take analgesics more than three times a week. If the cycle of rebound headaches continues, they become chronic daily headaches.  CAUSES Analgesics frequently associated with this problem include  common over-the-counter medicines like aspirin, ibuprofen, acetaminophen, sinus relief medicines, and other medicines that contain caffeine. Narcotic pain medicines are also a common cause of rebound headaches.  SIGNS AND SYMPTOMS The symptoms of rebound headaches are the same as the symptoms of your initial headache. Symptoms of specific types of headaches include: Tension headache  Pressure around the head.  Dull, aching head pain.  Pain felt over the front and sides of the head.  Tenderness in the muscles of the head, neck and shoulders. Migraine Headache  Pulsing or throbbing pain on one or both sides of the head.  Severe pain that interferes with daily activities.  Pain that is worsened by physical activity.  Nausea, vomiting, or both.  Pain with exposure to bright light, loud noises, or strong smells.  General sensitivity to bright light, loud noises, or strong smells.  Visual changes.  Numbness of one or both arms. Cluster Headaches  Severe pain that begins in or around one eye or temple.  Redness in the eye on the same side as the pain.  Droopy or swollen eyelid.  One-sided head pain.  Nausea.  Runny nose.  Sweaty, pale facial skin.  Restlessness. DIAGNOSIS  Analgesic rebound headaches are diagnosed by reviewing your medical history. This includes the nature of your initial headaches, as well as the type of pain medicines you have been using to treat your headaches and how often you take them. TREATMENT Discontinuing frequent use of the analgesic medicine will typically reduce the frequency of the rebound episodes. This may initially worsen your headaches but eventually the pain should become more manageable, less frequent, and less severe.  Seeing a headache specialists may helpful. He or she may be able to help you manage your headaches and to make sure there is not another cause of the headaches. Alternative methods of stress relief such as acupuncture,  counseling, biofeedback, and massage may also be helpful. Talk with your health care provider about which alternative treatments might be good for you. HOME CARE INSTRUCTIONS Stopping the regular use of pain medicine can be difficult. Follow your health care provider's instructions carefully. Keep all of your appointments. Avoid triggers that are known to cause your primary headaches. SEEK MEDICAL CARE IF: You continue to experience headaches after following your health care provider's recommended treatments. SEEK IMMEDIATE MEDICAL CARE IF:  You develop new headache pain.  You develop headache pain that is different than what you have experienced in the past.  You develop numbness or tingling in your arms or legs.  You develop changes in your speech or vision. MAKE SURE YOU:  Understand these instructions.  Will watch your child's condition.  Will get help right away if your child is not doing well or gets worse.   This information is not intended to replace advice given to you by your health care provider. Make sure you discuss any questions you have with your health care provider.   Document Released: 03/16/2003 Document Revised: 01/14/2014 Document Reviewed: 07/09/2012 Elsevier Interactive Patient Education [employer].

## 2015-08-25 NOTE — Progress Notes (Signed)
History:  Dana Gonzalez is a 25 y.o. [phone] who presents to clinic today for new headache evaluation.  She has had them since age 7 or 49.  They are severe and located behind the eyes.  They are more right sided.  It feels like pressure with throbbing.  Worse with movement.  Lights and noises are problematic.  She has nausea and dizziness but no vomiting.  Vision is blurry with worst headaches.  The headaches last all day and are improved with sleep.  HA starts mild.  No other warning signs.  No numbness/ tingling.  Sometimes drinking caffeine helps some.    She usually drinks Dr. Manson Passey per day and red bull generally 2/day. Recently waking up with HA.  These are often the worst pain.   Works on Teaching laboratory technician all day.  meds used: Tylenol, excedrin, recently tried imitrex and it was helpful but she used entire month supply in a few days  HIT6:70 Number of days in the last 4 weeks with:  Severe headache: 13 Moderate headache: 0 Mild headache: 7 No headache: 8  Past Medical History:  Diagnosis Date  . GERD (gastroesophageal reflux disease)   . Headache(784.0)   . Infection    UTI  . Thyroid disease     Social History   Social History  . Marital status: Single    Spouse name: N/A  . Number of children: N/A  . Years of education: N/A   Occupational History  . Not on file.   Social History Main Topics  . Smoking status: Former Smoker    Types: Cigarettes    Quit date: 05/22/2012  . Smokeless tobacco: Never Used     Comment: Aug 2015  . Alcohol use No     Comment: No THC use since age 59  . Drug use: No     Comment: not since age 9  . Sexual activity: Yes    Birth control/ protection: IUD   Other Topics Concern  . Not on file   Social History Narrative  . No narrative on file    Family History  Problem Relation Age of Onset  . Cancer Mother     breast to bone  . Hypertension Father   . Diabetes Maternal Grandmother   . Stroke Maternal Grandmother   . Heart  disease Paternal Grandmother     No Known Allergies  Current Outpatient Prescriptions on File Prior to Visit  Medication Sig Dispense Refill  . SUMAtriptan (IMITREX) 100 MG tablet Take 1 tablet (100 mg total) by mouth once as needed for migraine. May repeat in 2 hours if headache persists or recurs. 9 tablet 11   No current facility-administered medications on file prior to visit.      Review of Systems:  All pertinent positive/negative included in HPI, all other review of systems are negative  Objective:  Physical Exam BP 126/85 (BP Location: Left Arm, Patient Position: Sitting, Cuff Size: Large)   Pulse 88   Resp 18   Ht 5\' 7"  (1.702 m)   Wt 198 lb (89.8 kg)   LMP 08/25/2015   BMI 31.01 kg/m  CONSTITUTIONAL: Well-developed, well-nourished female in no acute distress.  EYES: EOM intact ENT: Normocephalic CARDIOVASCULAR: Regular rate and rhythm with no adventitious sounds.  RESPIRATORY: Normal rate. Clear to auscultation bilaterally.  ENDOCRINE: Normal thyroid.  MUSCULOSKELETAL: Normal ROM, strength equal bilaterally, trapezius muscle spasm noted SKIN: Warm, dry without erythema  NEUROLOGICAL: Alert, oriented, CN II-XII grossly intact, Appropriate balance,  No dysmetria, Sensation equal bilaterally, Romberg negative.   PSYCH: Normal behavior, mood   Assessment & Plan:  Assessment: 1. Migraine without aura and without status migrainosus, not intractable   2. Rebound headache    New diagnoses  Plan: Cut back and eventually eliminate regular caffeine Topamax daily-  titrate to 75mg  qhs (should help with stopping soda.  May help decrease weight) for migraine prevention.   Baclofen for muscle spasm/HA.  May cause sedation.  May use 1/2 tab.   Imitrex - limit to 2/day up to 2 days per week.  May combine with ibuprofen or naprosyn to maximize efficacy Follow-up in 2 months or sooner PRN  Paticia Stack, PA-C 08/25/2015 10:53 AM

## 2015-08-29 MED FILL — SUMATRIPTAN SUCC 100 MG TAB: 100 | 30 days supply | Qty: 9 | Fill #1

## 2015-08-30 ENCOUNTER — Telehealth: Payer: Self-pay | Admitting: Genetic Counselor

## 2015-08-30 NOTE — Telephone Encounter (Signed)
08/31/2015 Appointment time changed per patient request.

## 2015-08-31 ENCOUNTER — Other Ambulatory Visit: Payer: 59

## 2015-09-04 ENCOUNTER — Other Ambulatory Visit: Payer: 59

## 2015-09-04 ENCOUNTER — Ambulatory Visit (HOSPITAL_BASED_OUTPATIENT_CLINIC_OR_DEPARTMENT_OTHER): Payer: 59 | Admitting: Genetic Counselor

## 2015-09-04 ENCOUNTER — Encounter: Payer: Self-pay | Admitting: Genetic Counselor

## 2015-09-04 DIAGNOSIS — Z803 Family history of malignant neoplasm of breast: Secondary | ICD-10-CM

## 2015-09-04 DIAGNOSIS — Z315 Encounter for genetic counseling: Secondary | ICD-10-CM | POA: Diagnosis not present

## 2015-09-04 NOTE — Progress Notes (Signed)
REFERRING PROVIDER: Rogue Bussing, MD [place], [place] [address]   Dr. Harolyn Rutherford  PRIMARY PROVIDER:  Darci Needle, MD  PRIMARY REASON FOR VISIT:  1. Family history of breast cancer      HISTORY OF PRESENT ILLNESS:   Dana Gonzalez, a 25 y.o. female, was seen for a [facility] cancer genetics consultation at the request of Dr. Ola Spurr due to a family history of cancer.  Dana Gonzalez presents to clinic today to discuss the possibility of a hereditary predisposition to cancer, genetic testing, and to further clarify her future cancer risks, as well as potential cancer risks for family members. Dana Gonzalez is a 25 y.o. female with no personal history of cancer.  Her mother was diagnosed with breast cancer at age 57, when the patient was 25 years old.  She has two other maternal aunts who were diagnosed with breast cancer as well.  There is no known genetic testing at this time.  CANCER HISTORY:   No history exists.     HORMONAL RISK FACTORS:  Menarche was at age 41-12.  First live birth at age 26.  OCP use for approximately No OCPs - patient has been on mirena and depo-provera years.  Ovaries intact: yes.  Hysterectomy: no.  Menopausal status: premenopausal.  HRT use: 0 years. Colonoscopy: no; not examined. Mammogram within the last year: no. Number of breast biopsies: 0. Up to date with pelvic exams:  yes. Any excessive radiation exposure in the past:  no  Past Medical History:  Diagnosis Date  . Family history of breast cancer   . GERD (gastroesophageal reflux disease)   . Headache(784.0)   . Infection    UTI  . Thyroid disease     Past Surgical History:  Procedure Laterality Date  . MOUTH SURGERY      Social History   Social History  . Marital status: Single    Spouse name: N/A  . Number of children: N/A  . Years of education: N/A   Social History Main Topics  . Smoking status: Former Smoker    Packs/day: 0.50    Types: Cigarettes    Quit date: 05/22/2012  . Smokeless tobacco: Never Used     Comment: Aug 2015  . Alcohol use No     Comment: No THC use since age 31  . Drug use: No     Comment: not since age 14  . Sexual activity: Yes    Birth control/ protection: IUD   Other Topics Concern  . None   Social History Narrative  . None     FAMILY HISTORY:  We obtained a detailed, 4-generation family history.  Significant diagnoses are listed below: Family History  Problem Relation Age of Onset  . Cancer Mother 53    breast to bone  . Hypertension Father   . Diabetes Maternal Grandmother   . Stroke Maternal Grandmother   . Heart disease Paternal Grandmother   . Breast cancer Maternal Aunt     dx in her 52s  . Mental retardation Maternal Aunt   . Breast cancer Maternal Aunt 40    dx twice, another time in her 46s-50s    The patient has four daughters, one who died in infancy from amniotic band syndrome.  She has a brother who is 54 and healthy.  Her mother was daignsoed with breast cancer at 31 and died at 59.  She had three sisters, one who diagnosed with breast cancer in her  88s, a second who was diagnosed at 68 and then again in her 45-50s and a third who died at 24 from pneumonia.  The patient's maternal grandmother is alive at 1 and her grandfather died from [place].  The patient's father is alive at 72.  He had one brother who is healthy.  His mother is alive and his father died of a heart attack.  Patient's maternal ancestors are of [place] and [place] descent, and paternal ancestors are of [place] descent. There is no reported Ashkenazi Jewish ancestry. There is no known consanguinity.  GENETIC COUNSELING ASSESSMENT: Dana Gonzalez is a 25 y.o. female with a family history of breast cancer which is somewhat suggestive of a hereditary breast cancer syndrome and predisposition to cancer. We, therefore, discussed and recommended the following at today's visit.   DISCUSSION: We discussed that about  5-10% of breast cancer is due to hereditary causes, most commonly BRCA mutations.  Other hereditary mutations can be seen in ATM, PALB2, and CHEK2.  We reviewed the characteristics, features and inheritance patterns of hereditary cancer syndromes. We also discussed genetic testing, including the appropriate family members to test, the process of testing, insurance coverage and turn-around-time for results. We discussed the implications of a negative, positive and/or variant of uncertain significant result. We recommended Dana Gonzalez pursue genetic testing for the Breast/Ovarian cancer gene panel. The Breast/Ovarian gene panel offered by [facility] includes sequencing and rearrangement analysis for the following 20 genes:  ATM, BARD1, BRCA1, BRCA2, BRIP1, CDH1, CHEK2, EPCAM, FANCC, MLH1, MSH2, MSH6, NBN, PALB2, PMS2, PTEN, RAD51C, RAD51D, TP53, and XRCC2.     Based on Dana Gonzalez family history of cancer, she meets medical criteria for genetic testing. Despite that she meets criteria, she may still have an out of pocket cost. We discussed that if her out of pocket cost for testing is over $100, the laboratory will call and confirm whether she wants to proceed with testing.  If the out of pocket cost of testing is less than $100 she will be billed by the genetic testing laboratory.   In order to estimate her chance of having a BRCA mutation, we used statistical models (Penn II, Tyrer Cusik) and laboratory data that take into account her personal medical history, family history and ancestry.  Because each model is different, there can be a lot of variability in the risks they give.  Therefore, these numbers must be considered a rough range and not a precise risk of having a BRCA mutation.  These models estimate that she has approximately a 6-10% chance of having a mutation. Based on this assessment of her family and personal history, genetic testing is recommended.  Based on the patient's personal and family history,  statistical models (Tyrer Cusik)  and literature data were used to estimate her risk of developing breast cancer. These estimate her lifetime risk of developing breast cancer to be approximately 27.2%. This estimation does not take into account any genetic testing results.  The patient's lifetime breast cancer risk is a preliminary estimate based on available information using one of several models endorsed by the [place] (ACS). The ACS recommends consideration of breast MRI screening as an adjunct to mammography for patients at high risk (defined as 20% or greater lifetime risk). A more detailed breast cancer risk assessment can be considered, if clinically indicated.   Dana Gonzalez has been determined to be at high risk for breast cancer.  Therefore, we recommend that annual screening with mammography and  breast MRI begin at age 104, or 10 years prior to the age of breast cancer diagnosis in a relative (whichever is earlier).  We discussed that Dana Gonzalez should discuss her individual situation with her referring physician and determine a breast cancer screening plan with which they are both comfortable.    PLAN: After considering the risks, benefits, and limitations, Dana Gonzalez  provided informed consent to pursue genetic testing and the blood sample was sent to [facility] for analysis of the Breast/Ovarian cancer panel. Results should be available within approximately 2-3 weeks' time, at which point they will be disclosed by telephone to Dana Gonzalez, as will any additional recommendations warranted by these results. Dana Gonzalez will receive a summary of her genetic counseling visit and a copy of her results once available. This information will also be available in Epic. We encouraged Dana Gonzalez to remain in contact with cancer genetics annually so that we can continuously update the family history and inform her of any changes in cancer genetics and testing that may be of benefit for her  family. Dana Gonzalez questions were answered to her satisfaction today. Our contact information was provided should additional questions or concerns arise.  Lastly, we encouraged Dana Gonzalez to remain in contact with cancer genetics annually so that we can continuously update the family history and inform her of any changes in cancer genetics and testing that may be of benefit for this family.   Ms.  Gonzalez questions were answered to her satisfaction today. Our contact information was provided should additional questions or concerns arise. Thank you for the referral and allowing [place] to share in the care of your patient.   Lucyle Alumbaugh P. Florene Glen, [place], [facility] Certified Genetic Counselor [email or web address] phone: [phone]  The patient was seen for a total of 45 minutes in face-to-face genetic counseling.  This patient was discussed with Drs. Magrinat, Lindi Adie and/or Burr Medico who agrees with the above.    _______________________________________________________________________ For Office Staff:  Number of people involved in session: 1 Was an Intern/ student involved with case: no

## 2015-09-13 ENCOUNTER — Encounter: Payer: Self-pay | Admitting: Physician Assistant

## 2015-09-26 ENCOUNTER — Encounter: Payer: Self-pay | Admitting: Genetic Counselor

## 2015-09-26 ENCOUNTER — Ambulatory Visit: Payer: Self-pay | Admitting: Genetic Counselor

## 2015-09-26 ENCOUNTER — Telehealth: Payer: Self-pay | Admitting: Genetic Counselor

## 2015-09-26 DIAGNOSIS — Z803 Family history of malignant neoplasm of breast: Secondary | ICD-10-CM

## 2015-09-26 DIAGNOSIS — Z1379 Encounter for other screening for genetic and chromosomal anomalies: Secondary | ICD-10-CM

## 2015-09-26 NOTE — Telephone Encounter (Signed)
Revealed negative genetic testing on the breast/ovarian cancer panel.  Explained that we do not know why there is young breast cancer in the family, and that we should consider following Kamya based on her family history.

## 2015-09-26 NOTE — Telephone Encounter (Signed)
LM on VM with good news.  Asked that she CB. 

## 2015-09-26 NOTE — Progress Notes (Signed)
HPI: Ms. Bolla was previously seen in the [facility] clinic due to a family history of cancer and concerns regarding a hereditary predisposition to cancer. Please refer to our prior cancer genetics clinic note for more information regarding Ms. Kenyon's medical, social and family histories, and our assessment and recommendations, at the time. Ms. Therriault recent genetic test results were disclosed to her, as were recommendations warranted by these results. These results and recommendations are discussed in more detail below.  FAMILY HISTORY:  We obtained a detailed, 4-generation family history.  Significant diagnoses are listed below: Family History  Problem Relation Age of Onset  . Cancer Mother 20    breast to bone  . Hypertension Father   . Diabetes Maternal Grandmother   . Stroke Maternal Grandmother   . Heart disease Paternal Grandmother   . Breast cancer Maternal Aunt 28  . Mental retardation Maternal Aunt   . Breast cancer Maternal Aunt 40    dx twice, another time in her 31s-50s    The patient has four daughters, one who died in infancy from amniotic band syndrome.  She has a brother who is 48 and healthy.  Her mother was daignsoed with breast cancer at 22 and died at 60.  She had three sisters, one who diagnosed with breast cancer at 44, a second who was diagnosed at 51 and then again in her 31-50s and a third who died at 76 from pneumonia.  The patient's maternal grandmother is alive at 73 and her grandfather died from [place].  The patient's father is alive at 20.  He had one brother who is healthy.  His mother is alive and his father died of a heart attack.  Patient's maternal ancestors are of [place] and [place] descent, and paternal ancestors are of [place] descent. There is no reported Ashkenazi Jewish ancestry. There is no known consanguinity.  GENETIC TEST RESULTS: At the time of Ms. Grochowski's visit, we recommended she pursue genetic testing of the  Breast/Ovarian cancer gene panel. The Breast/Ovarian gene panel offered by [facility] includes sequencing and rearrangement analysis for the following 20 genes:  ATM, BARD1, BRCA1, BRCA2, BRIP1, CDH1, CHEK2, EPCAM, FANCC, MLH1, MSH2, MSH6, NBN, PALB2, PMS2, PTEN, RAD51C, RAD51D, TP53, and XRCC2.   The report date is September 25, 2015.  Genetic testing was normal, and did not reveal a deleterious mutation in these genes. The test report has been scanned into EPIC and is located under the Molecular Pathology section of the Results Review tab.   We discussed with Ms. Nolet that since the current genetic testing is not perfect, it is possible there may be a gene mutation in one of these genes that current testing cannot detect, but that chance is small. We also discussed, that it is possible that another gene that has not yet been discovered, or that we have not yet tested, is responsible for the cancer diagnoses in the family, and it is, therefore, important to remain in touch with cancer genetics in the future so that we can continue to offer Ms. Turi the most up to date genetic testing.   CANCER SCREENING RECOMMENDATIONS: Given Ms. Locken's family history, we must interpret these negative results with some caution.  Families with features suggestive of hereditary risk for cancer tend to have multiple family members with cancer, diagnoses in multiple generations and diagnoses before the age of 5. Ms. Molinaro's family exhibits some of these features. Thus this result may simply reflect our current inability  to detect all mutations within these genes or there may be a different gene that has not yet been discovered or tested.   Based on the Ms. Lenker's personal and family history of cancer, as well as her genetic test results, statistical models (Tyrer Cusik)  and literature data were used to estimate her risk of developing breast cancer. These estimate her lifetime risk of developing breast cancer to be  approximately 21%.  The patient's lifetime breast cancer risk is a preliminary estimate based on available information using one of several models endorsed by the [place] (ACS). The ACS recommends consideration of breast MRI screening as an adjunct to mammography for patients at high risk (defined as 20% or greater lifetime risk). A more detailed breast cancer risk assessment can be considered, if clinically indicated.   Ms. Marcano has been determined to be at high risk for breast cancer.  Therefore, we recommend that annual screening with mammography and breast MRI begin at age 60, or 10 years prior to the age of breast cancer diagnosis in a relative (whichever is earlier).  We discussed that Ms. Downum should discuss her individual situation with her referring physician and determine a breast cancer screening plan with which they are both comfortable.    RECOMMENDATIONS FOR FAMILY MEMBERS: Women in this family might be at some increased risk of developing cancer, over the general population risk, simply due to the family history of cancer. We recommended women in this family have a yearly mammogram beginning at age 100, or 31 years younger than the earliest onset of cancer, an an annual clinical breast exam, and perform monthly breast self-exams. Women in this family should also have a gynecological exam as recommended by their primary provider. All family members should have a colonoscopy by age 37.  Based on Ms. Jares's family history, we recommended her maternal aunt, Meriam, who was diagnosed with breast cancer in her 37s, have genetic counseling and testing. Ms. Cipriani indicated that there are mental health issues with her aunt and this may not be possible.  We also discussed that her maternal cousins could undergo genetic testing to determine their status.  Ms. Haag will let us know if we can be of any assistance in coordinating genetic counseling and/or testing for this family  member.   FOLLOW-UP: Lastly, we discussed with Ms. Josten that cancer genetics is a rapidly advancing field and it is possible that new genetic tests will be appropriate for her and/or her family members in the future. We encouraged her to remain in contact with cancer genetics on an annual basis so we can update her personal and family histories and let her know of advances in cancer genetics that may benefit this family.   Our contact number was provided. Ms. Curl questions were answered to her satisfaction, and she knows she is welcome to call us at anytime with additional questions or concerns.   Roma Kayser, MS, [facility] Certified Genetic Counselor [email or web address]

## 2015-10-05 ENCOUNTER — Encounter: Payer: Self-pay | Admitting: Internal Medicine

## 2015-10-09 ENCOUNTER — Telehealth: Payer: 59 | Admitting: Family

## 2015-10-09 DIAGNOSIS — L739 Follicular disorder, unspecified: Secondary | ICD-10-CM

## 2015-10-09 MED ORDER — CEPHALEXIN 500 MG PO CAPS: 500.0000 mg | ORAL_CAPSULE | Freq: Three times a day (TID) | ORAL | 0 refills | Status: DC

## 2015-10-09 MED FILL — CEPHALEXIN 500 MG CAPSULE: 500 | 7 days supply | Qty: 21 | Fill #0

## 2015-10-09 NOTE — Progress Notes (Signed)
E Visit for Rash  We are sorry that you are not feeling well. Here is how we plan to help!      Keflex 500 mg three times per day for 7 days       HOME CARE:   Take cool showers and avoid direct sunlight.  Apply cool compress or wet dressings.  Take a bath in an oatmeal bath.  Sprinkle content of one Aveeno packet under running faucet with comfortably warm water.  Bathe for 15-20 minutes, 1-2 times daily.  Pat dry with a towel. Do not rub the rash.  Use hydrocortisone cream.  Take an antihistamine like Benadryl for widespread rashes that itch.  The adult dose of Benadryl is 25-50 mg by mouth 4 times daily.  Caution:  This type of medication may cause sleepiness.  Do not drink alcohol, drive, or operate dangerous machinery while taking antihistamines.  Do not take these medications if you have prostate enlargement.  Read package instructions thoroughly on all medications that you take.  GET HELP RIGHT AWAY IF:   Symptoms don't go away after treatment.  Severe itching that persists.  If you rash spreads or swells.  If you rash begins to smell.  If it blisters and opens or develops a yellow-brown crust.  You develop a fever.  You have a sore throat.  You become short of breath.  MAKE SURE YOU:  Understand these instructions. Will watch your condition. Will get help right away if you are not doing well or get worse.  Thank you for choosing an e-visit. Your e-visit answers were reviewed by a board certified advanced clinical practitioner to complete your personal care plan. Depending upon the condition, your plan could have included both over the counter or prescription medications. Please review your pharmacy choice. Be sure that the pharmacy you have chosen is open so that you can pick up your prescription now.  If there is a problem you may message your provider in MyChart to have the prescription routed to another pharmacy. Your safety is important to us. If  you have drug allergies check your prescription carefully.  For the next 24 hours, you can use MyChart to ask questions about today's visit, request a non-urgent call back, or ask for a work or school excuse from your e-visit provider. You will get an email in the next two days asking about your experience. I hope that your e-visit has been valuable and will speed your recovery.      

## 2015-10-27 ENCOUNTER — Encounter: Payer: 59 | Admitting: Physician Assistant

## 2015-10-27 MED FILL — SUMATRIPTAN SUCC 100 MG TAB: 100 | 30 days supply | Qty: 9 | Fill #2

## 2015-11-24 ENCOUNTER — Encounter: Payer: 59 | Admitting: Physician Assistant

## 2015-11-28 ENCOUNTER — Telehealth: Payer: 59 | Admitting: Family

## 2015-11-28 DIAGNOSIS — S39012A Strain of muscle, fascia and tendon of lower back, initial encounter: Secondary | ICD-10-CM | POA: Diagnosis not present

## 2015-11-28 MED ORDER — NAPROXEN 500 MG PO TABS: 500.0000 mg | ORAL_TABLET | Freq: Two times a day (BID) | ORAL | 0 refills | Status: DC

## 2015-11-28 MED ORDER — CYCLOBENZAPRINE HCL 10 MG PO TABS: 10.0000 mg | ORAL_TABLET | Freq: Three times a day (TID) | ORAL | 0 refills | Status: DC | PRN

## 2015-11-28 MED FILL — CYCLOBENZAPRINE 10 MG TAB: 10 | 10 days supply | Qty: 30 | Fill #0

## 2015-11-28 MED FILL — NAPROXEN 500 MG TABLET: 500 | 15 days supply | Qty: 30 | Fill #0

## 2015-11-28 NOTE — Progress Notes (Signed)

## 2015-12-04 MED FILL — SUMATRIPTAN SUCC 100 MG TAB: 100 | 30 days supply | Qty: 9 | Fill #3

## 2015-12-05 ENCOUNTER — Telehealth: Payer: 59 | Admitting: Family

## 2015-12-05 DIAGNOSIS — H538 Other visual disturbances: Secondary | ICD-10-CM

## 2015-12-05 NOTE — Progress Notes (Signed)
Based on what you shared with me it looks like you have a serious condition that should be evaluated in a face to face office visit.  If you are having a true medical emergency please call 911.  If you need an urgent face to face visit, [place] has four urgent care centers for your convenience.  If you need care fast and have a high deductible or no insurance consider:   [email or web address]  [phone]  [address], [address] [place], [place] [address] 8 am to 8 pm Monday-Friday 10 am to 4 pm Saturday-Sunday   The following sites will take your  insurance:    . [facility]  [phone] Get Driving Directions Find a Provider at this Location  [address] [place], [place] [address] . 10 am to 8 pm Monday-Friday . 12 pm to 8 pm Saturday-Sunday   . [facility]  [phone] Get Driving Directions Find a Provider at this Location  [address], [address] [place], [place] [address] . 8 am to 8 pm Monday-Friday . 9 am to 6 pm Saturday . 11 am to 6 pm Sunday   . [facility] [place]  [phone] Get Driving Directions  [address] [place], [place] [address] . 8 am to 8 pm Monday-Friday . 8 am to 4 pm Saturday-Sunday   . [facility] (walk-ins welcome, or call for a scheduled time)  [phone]  Get Driving Directions Find a Provider at this Location  [address] [place], [place] [address] . 8 am to 8:30 pm Monday-Thursday . 8 am to 6 pm Friday . 8 am to 4 pm Saturday-Sunday   Your e-visit answers were reviewed by a board certified advanced clinical practitioner to complete your personal care plan.  Thank you for using e-Visits.  

## 2016-01-05 ENCOUNTER — Encounter: Payer: Self-pay | Admitting: Internal Medicine

## 2016-01-05 NOTE — Progress Notes (Signed)
Received FMLA paperwork in mailbox for patient who has not been seen since July. Called patient and asked her for more information and let her know she would need an appointment for evaluation before filling these papers out. Please ask patient to schedule appointment if she calls back.  Olene Floss, MD [place], PGY-2

## 2016-01-10 ENCOUNTER — Telehealth: Payer: Self-pay

## 2016-01-10 NOTE — Telephone Encounter (Signed)
Left message on both numbers that we have on file. Will try calling again. If patient calls back please schedule appointment. Nat Christen, CMA

## 2016-01-10 NOTE — Telephone Encounter (Signed)
Called patient on both numbers on file, no answer on either. Left message for patient to call office back. If she does call back please schedule appointment. Nat Christen, CMA

## 2016-01-12 ENCOUNTER — Encounter: Payer: 59 | Admitting: Physician Assistant

## 2016-01-12 DIAGNOSIS — R51 Headache: Secondary | ICD-10-CM

## 2016-01-18 ENCOUNTER — Encounter: Payer: Self-pay | Admitting: Internal Medicine

## 2016-01-22 MED FILL — CLINDAMYCIN HCL 300 MG CAPS: 300 | 8 days supply | Qty: 32 | Fill #0

## 2016-01-23 MED FILL — HYDROCODON-APAP 5-325: 5-325 | 1 days supply | Qty: 6 | Fill #0

## 2016-06-11 IMAGING — US US OB COMP LESS 14 WK
1 series · 14 of 28 positions shown · non-contrast
Comparison: None.

CLINICAL DATA: Pregnant, abdominal pain

EXAM:
OBSTETRIC <14 WK US AND TRANSVAGINAL OB US
TECHNIQUE: Both transabdominal and transvaginal ultrasound examinations were
performed for complete evaluation of the gestation as well as the
maternal uterus, adnexal regions, and pelvic cul-de-sac.
Transvaginal technique was performed to assess early pregnancy.

[Series 1: us ob comp less 14 wks · 14 of 55 slices shown]
[im 3/55]
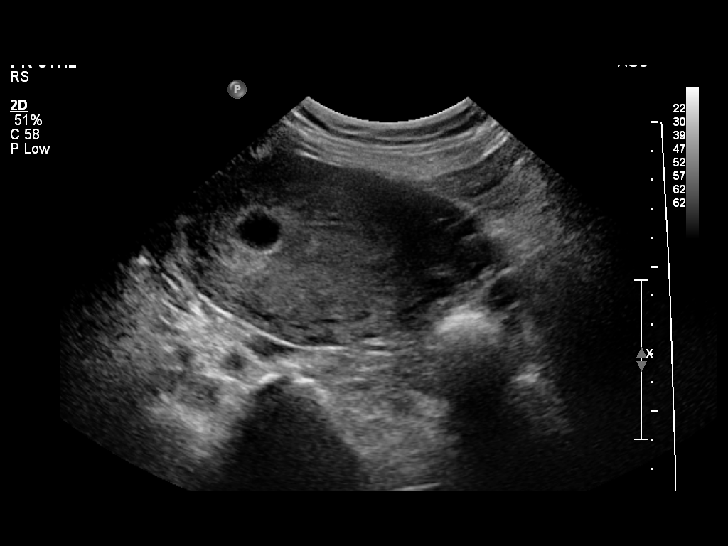
[im 7/55]
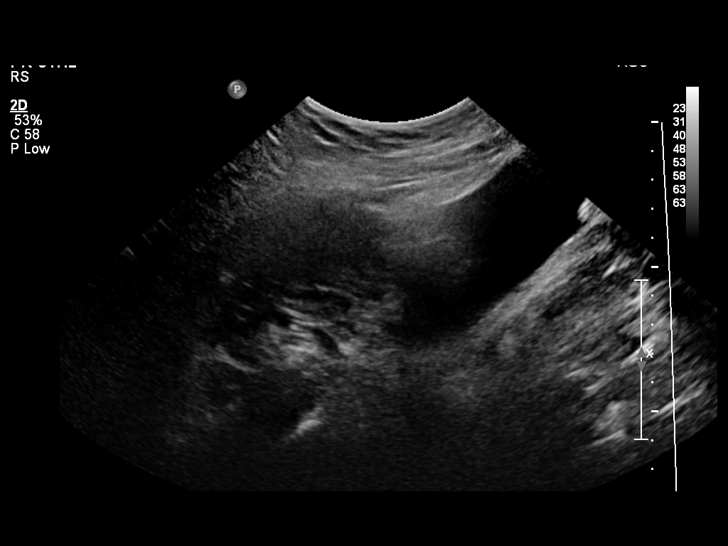
[im 11/55]
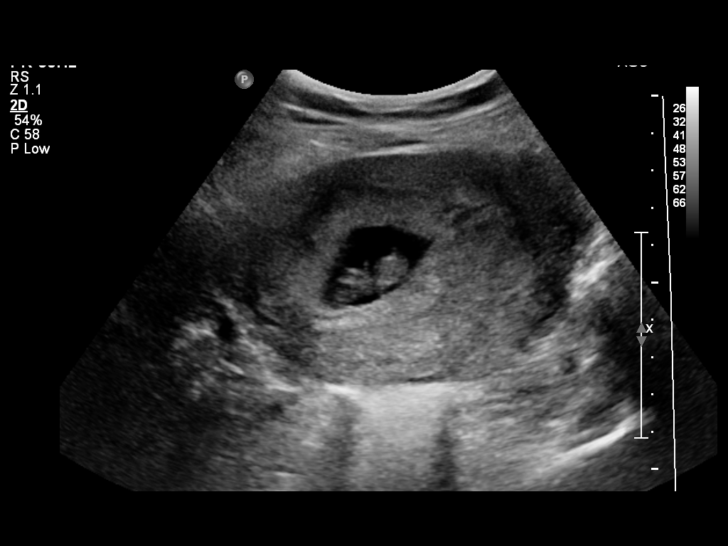
[im 15/55]
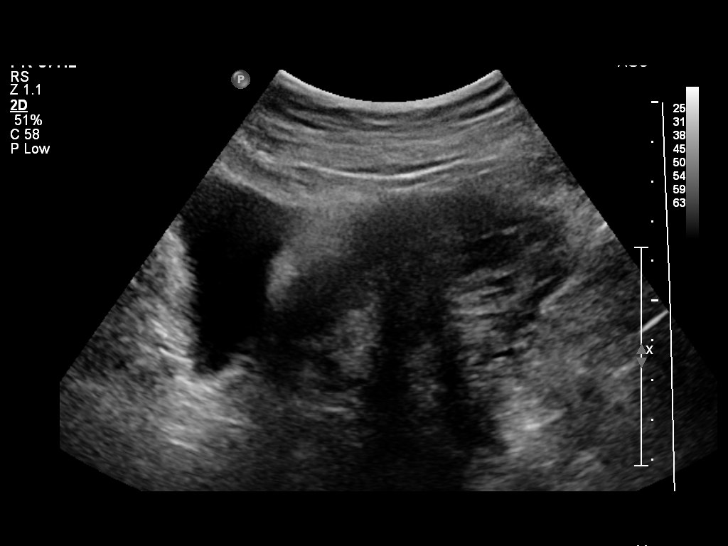
[im 19/55]
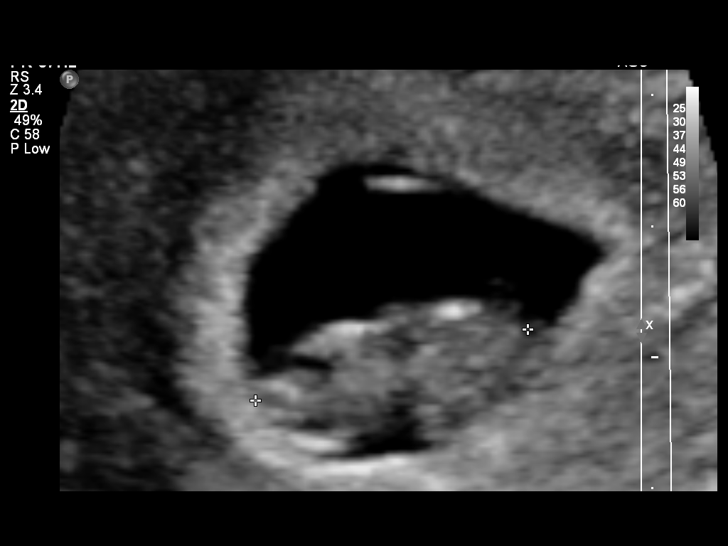
[im 23/55]
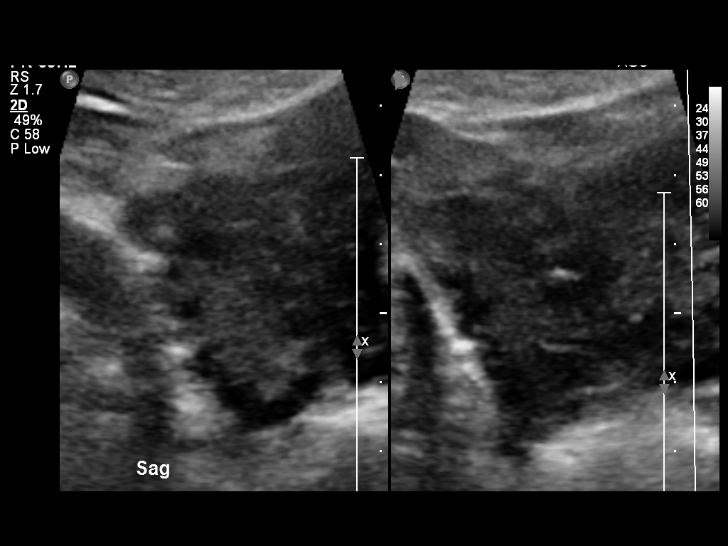
[im 27/55]
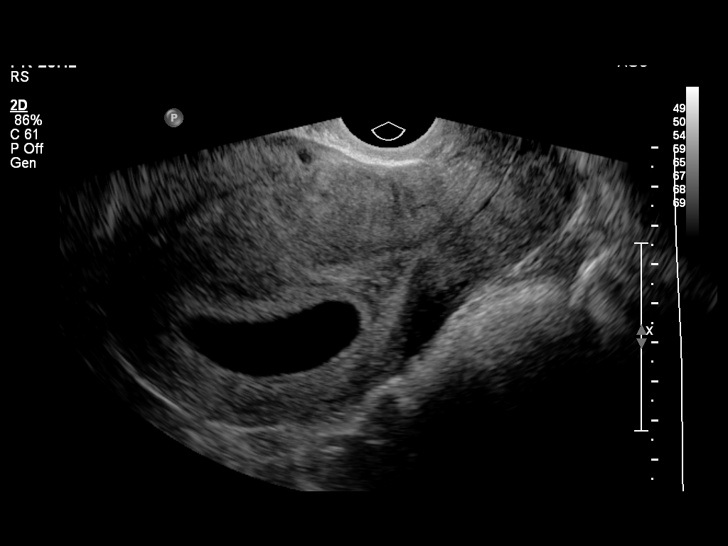
[im 31/55]
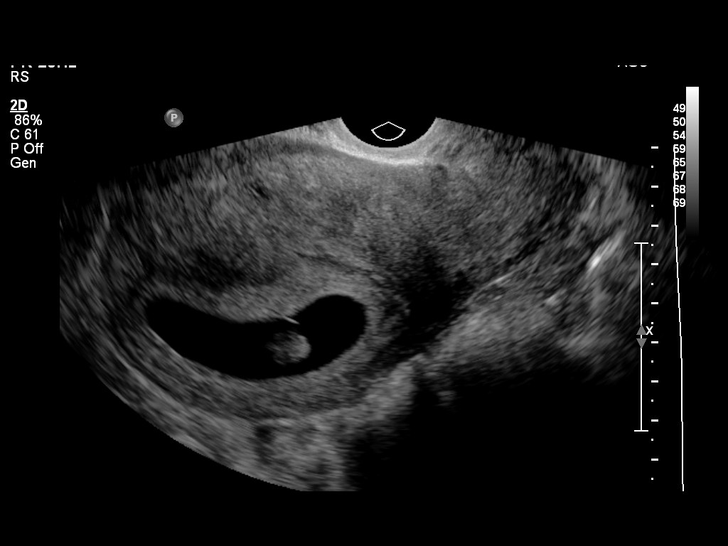
[im 35/55]
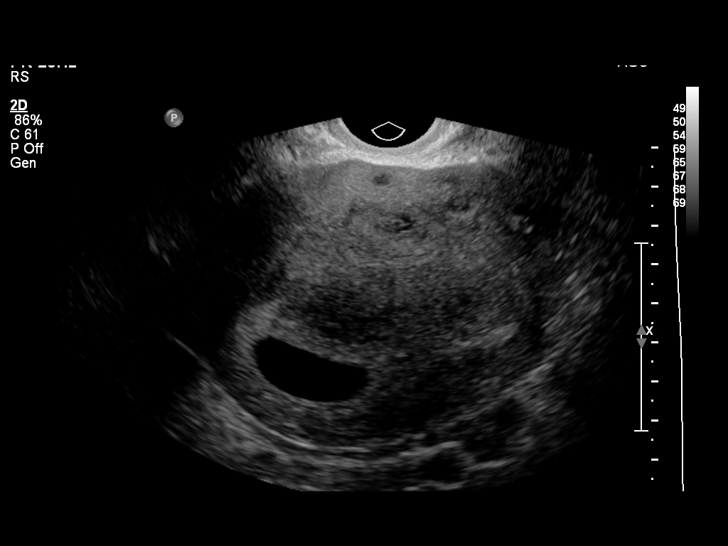
[im 39/55]
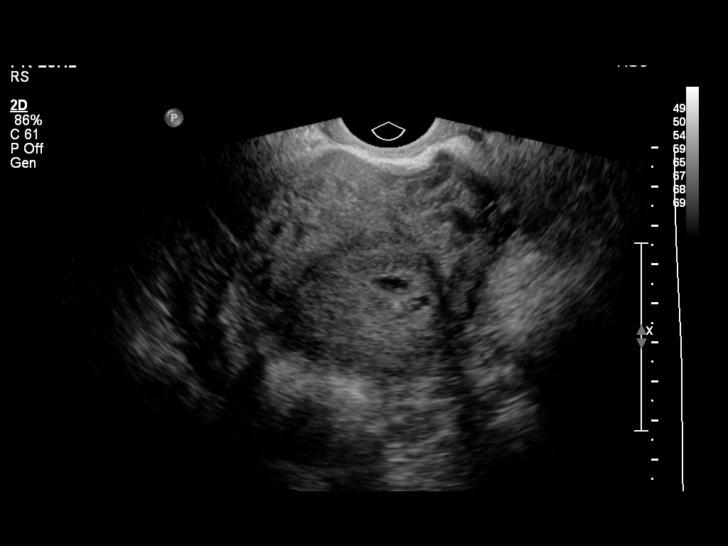
[im 43/55]
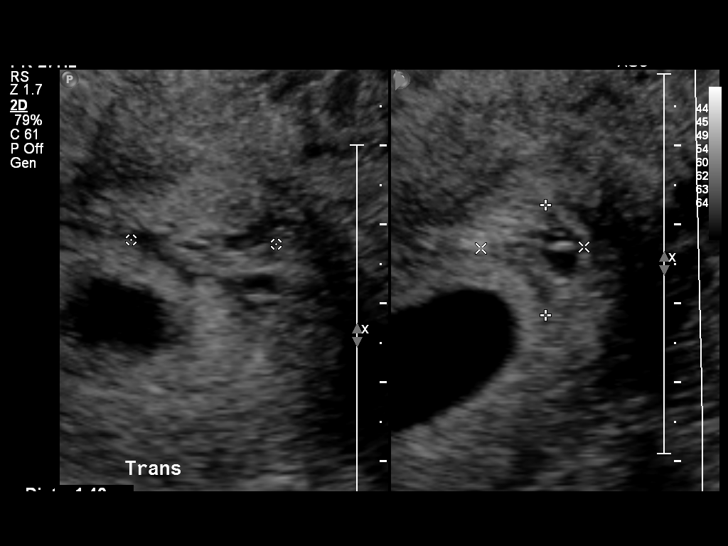
[im 47/55]
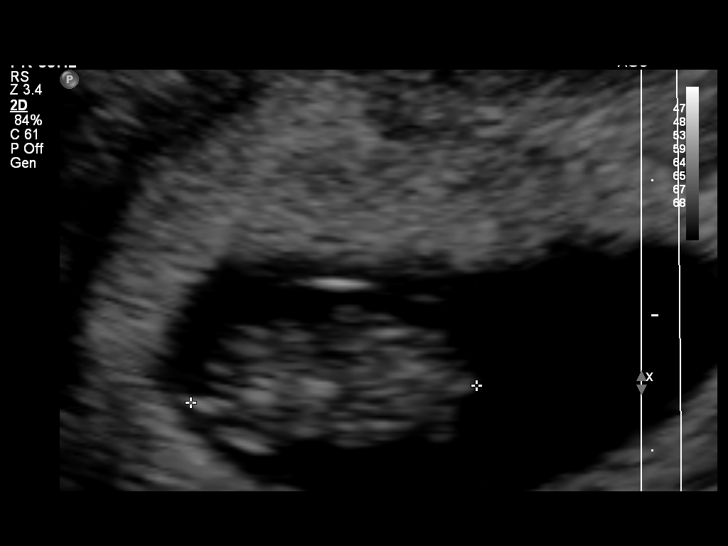
[im 51/55]
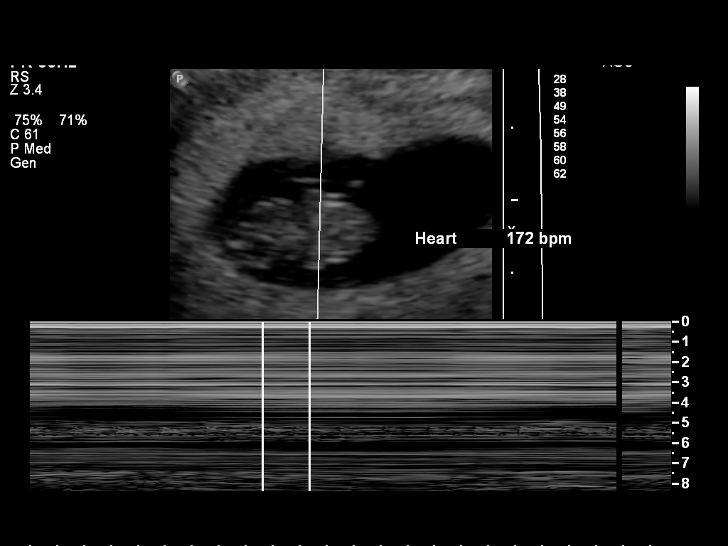
[im 55/55]
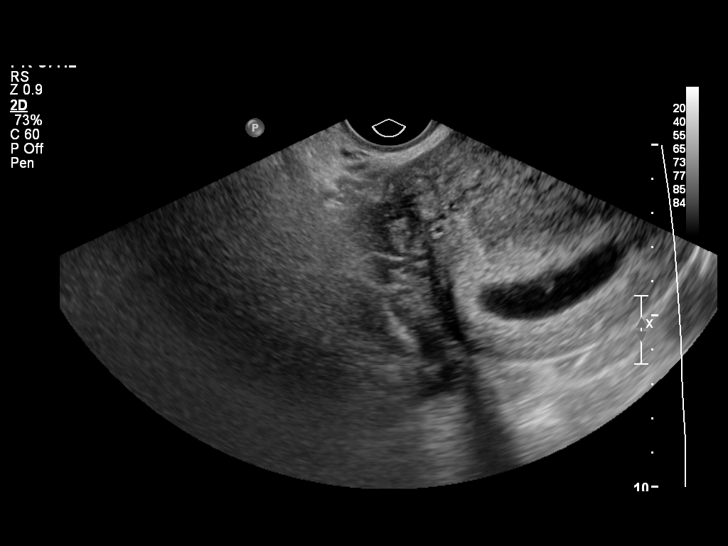

[14 of 28 positions shown; findings below may reference images not displayed]

FINDINGS: Intrauterine gestational sac: Visualized/normal in shape.

Yolk sac:  Present

Embryo:  Present

Cardiac Activity: Present

Heart Rate:  172 bpm

CRL:   21.4  mm   8 w 6 d                  US EDC: 07/06/2014

Maternal uterus/adnexae: Small subchorionic hemorrhage.

Right ovary is within normal limits, measuring 2.9 x 1.9 x 2.3 cm.

Left ovary is within normal limits, measuring 1.5 x 1.1 x 1.1 cm.

No free fluid.
IMPRESSION: Single live intrauterine gestation, measuring 8 weeks 6 days by
crown-rump length.

## 2016-06-20 ENCOUNTER — Encounter (HOSPITAL_COMMUNITY): Payer: Self-pay

## 2016-06-20 MED FILL — SUMATRIPTAN SUCC 100 MG TAB: 100 | 30 days supply | Qty: 9 | Fill #4

## 2016-07-29 ENCOUNTER — Ambulatory Visit (INDEPENDENT_AMBULATORY_CARE_PROVIDER_SITE_OTHER): Payer: Medicaid Other | Admitting: Certified Nurse Midwife

## 2016-07-29 ENCOUNTER — Encounter: Payer: Self-pay | Admitting: Certified Nurse Midwife

## 2016-07-29 VITALS — BP 101/69 | HR 69 | Ht 67.0 in | Wt 196.3 lb

## 2016-07-29 DIAGNOSIS — Z0000 Encounter for general adult medical examination without abnormal findings: Secondary | ICD-10-CM

## 2016-07-29 DIAGNOSIS — Z309 Encounter for contraceptive management, unspecified: Secondary | ICD-10-CM

## 2016-07-29 DIAGNOSIS — G43909 Migraine, unspecified, not intractable, without status migrainosus: Secondary | ICD-10-CM | POA: Insufficient documentation

## 2016-07-29 NOTE — Progress Notes (Signed)
Subjective:    Dana Gonzalez is a 26 y.o. female who presents for an annual exam. The patient has no complaints today. The patient is sexually active. GYN screening history: last pap: approximate date 07/27/15 and was normal. The patient wears seatbelts: yes. The patient participates in regular exercise: no. Has the patient ever been transfused or tattooed?: no. The patient reports that there is not domestic violence in her life. Has IUD for birth control.   Menstrual History: OB History    Gravida Para Term Preterm AB Living   4 4 2 2  0 3   SAB TAB Ectopic Multiple Live Births   0 0 0 0 3      Patient's last menstrual period was 07/19/2016 (approximate).    The following portions of the patient's history were reviewed and updated as appropriate: allergies, current medications, past family history, past medical history, past social history, past surgical history and problem list.  Review of Systems Pertinent items noted in HPI and remainder of comprehensive ROS otherwise negative.    Objective:  BP 101/69   Pulse 69   Ht 5\' 7"  (1.702 m)   Wt 196 lb 4.8 oz (89 kg)   LMP 07/19/2016 (Approximate)   BMI 30.74 kg/m   General Appearance:    Alert, cooperative, no distress, appears stated age  Head:    Normocephalic, without obvious abnormality, atraumatic  Eyes:    PERRL, conjunctiva/corneas clear, EOM's intact both eyes  Ears:    Normal external ear canals, both ears  Nose:   Nares normal, septum midline, mucosa normal, no drainage    or sinus tenderness  Throat:   Lips, mucosa, and tongue normal; teeth and gums normal  Neck:   Supple, symmetrical, trachea midline, no adenopathy;    thyroid:  no enlargement/tenderness/nodules; no carotid   bruit or JVD  Back:     Symmetric, no curvature, ROM normal, no CVA tenderness  Lungs:     Clear to auscultation bilaterally, respirations unlabored  Chest Wall:    No tenderness or deformity   Heart:    Regular rate and rhythm, S1 and S2  normal, no murmur, rub   or gallop  Breast Exam:    No tenderness, masses, or nipple abnormality  Abdomen:     Soft, non-tender, bowel sounds active all four quadrants,    no masses, no organomegaly  Genitalia:    Normal female without lesion, discharge or tenderness, IUD strings visible  Extremities:   Extremities normal, atraumatic, no cyanosis or edema  Pulses:   2+ and symmetric all extremities  Skin:   Skin color, texture, turgor normal, no rashes or lesions  Lymph nodes:   Cervical, supraclavicular, and axillary nodes normal  Neurologic:   CNII-XII intact, normal strength, sensation and reflexes    throughout  .    Assessment:    Healthy female exam.    Plan:     All questions answered. Breast self exam technique reviewed and patient encouraged to perform self-exam monthly. Discussed healthy lifestyle modifications. Neurosurgeon distributed. Follow up in 1 year.    Luiz Blare, DO [facility], [place] 07/29/2016, 10:43 AM

## 2016-07-29 NOTE — Patient Instructions (Signed)

## 2016-09-27 ENCOUNTER — Encounter: Payer: Self-pay | Admitting: Internal Medicine

## 2016-09-30 ENCOUNTER — Encounter: Payer: Self-pay | Admitting: Internal Medicine

## 2016-12-17 MED FILL — AMOXICILLIN 500 MG CAPSULE: 500 | 10 days supply | Qty: 30 | Fill #0

## 2017-01-08 DIAGNOSIS — H5213 Myopia, bilateral: Secondary | ICD-10-CM | POA: Diagnosis not present

## 2017-01-08 MED FILL — HYDROCODON-APAP 5-325: 5-325 | 1 days supply | Qty: 4 | Fill #0

## 2017-01-23 ENCOUNTER — Ambulatory Visit: Payer: Self-pay | Admitting: Internal Medicine

## 2017-05-31 ENCOUNTER — Encounter (HOSPITAL_COMMUNITY): Payer: Self-pay | Admitting: Oncology

## 2017-06-03 ENCOUNTER — Encounter: Payer: Self-pay | Admitting: Certified Nurse Midwife

## 2017-06-30 ENCOUNTER — Ambulatory Visit (INDEPENDENT_AMBULATORY_CARE_PROVIDER_SITE_OTHER): Payer: 59 | Admitting: Family Medicine

## 2017-06-30 ENCOUNTER — Encounter: Payer: Self-pay | Admitting: Family Medicine

## 2017-06-30 VITALS — BP 109/76 | HR 68 | Ht 67.0 in | Wt 189.5 lb

## 2017-06-30 DIAGNOSIS — Z833 Family history of diabetes mellitus: Secondary | ICD-10-CM | POA: Diagnosis not present

## 2017-06-30 DIAGNOSIS — E781 Pure hyperglyceridemia: Secondary | ICD-10-CM | POA: Insufficient documentation

## 2017-06-30 DIAGNOSIS — K219 Gastro-esophageal reflux disease without esophagitis: Secondary | ICD-10-CM

## 2017-06-30 DIAGNOSIS — E663 Overweight: Secondary | ICD-10-CM | POA: Diagnosis not present

## 2017-06-30 DIAGNOSIS — Z803 Family history of malignant neoplasm of breast: Secondary | ICD-10-CM

## 2017-06-30 DIAGNOSIS — Z0000 Encounter for general adult medical examination without abnormal findings: Secondary | ICD-10-CM

## 2017-06-30 DIAGNOSIS — G43909 Migraine, unspecified, not intractable, without status migrainosus: Secondary | ICD-10-CM | POA: Diagnosis not present

## 2017-06-30 DIAGNOSIS — R5382 Chronic fatigue, unspecified: Secondary | ICD-10-CM | POA: Diagnosis not present

## 2017-06-30 MED ORDER — RIZATRIPTAN BENZOATE 10 MG PO TABS: ORAL_TABLET | ORAL | 0 refills | Status: AC

## 2017-06-30 NOTE — Progress Notes (Signed)
New patient office visit note:  Impression and Recommendations:    1. Encounter for wellness examination   2. Overweight (BMI 25.0-29.9)   3. Migraine without status migrainosus, not intractable, unspecified migraine type   4. Family history of breast cancer   5. Gastroesophageal reflux disease, esophagitis presence not specified   6. Chronic fatigue   7. Family history of diabetes mellitus (DM)   8. Hypertriglyceridemia without hypercholesterolemia     1. Chronic Migraines - To control her migraines, advised patient to continue to exercise to manage stress.  - She should also hydrate adequately with about 100 oz of water per day.  Advised patient to consume extra bottles of water when physically active.  - Advised patient to begin taking a headache journal to identify trends in foods, drinks, times of the month, hormones, etc., that could be triggering her migraines.  - Encouraged the importance of ongoing healthy habits to bring down frequency of headaches.  - Abortive therapies prescribed today to help control migraines after onset.  2. Preventative Health Maintenance BMI Counseling Explained to patient what BMI refers to, and what it means medically.    Told patient to think about it as a "medical risk stratification measurement" and how increasing BMI is associated with increasing risk/ or worsening state of various diseases such as hypertension, hyperlipidemia, diabetes, premature OA, depression etc.  American Heart Association guidelines for healthy diet, basically Mediterranean diet, and exercise guidelines of 30 minutes 5 days per week or more discussed in detail.  Health counseling performed.  All questions answered.  Lifestyle - Advised patient to continue working toward exercising to improve health.    - Patient will continue with at least 30 minutes of activity daily.  Recommended that the patient eventually strive for at least 150 minutes of moderate  cardiovascular activity per week according to guidelines established by the [facility].   - Healthy dietary habits encouraged, including low-carb, and high amounts of lean protein in diet.   - Patient should also consume adequate amounts of water - half of body weight in oz of water per day.   Education and routine counseling performed. Handouts provided.  3. Follow-Up - Fasting labs will be drawn at earliest convenience for wellness screen.  - Patient will return for CPE at earliest convenience.  - Follow up in 4-6 weeks to evaluate migraine therapy, discuss GERD, and discuss fasting labs.   Orders Placed This Encounter  Procedures  . CBC with Differential/Platelet  . Comprehensive metabolic panel  . Hemoglobin A1c  . Lipid panel  . VITAMIN D 25 Hydroxy (Vit-D Deficiency, Fractures)  . T4, free  . TSH  . Direct LDL    Meds ordered this encounter  Medications  . rizatriptan (MAXALT) 10 MG tablet    Sig: May repeat in 2 hours if needed    Dispense:  10 tablet    Refill:  0    Gross side effects, risk and benefits, and alternatives of medications discussed with patient.  Patient is aware that all medications have potential side effects and we are unable to predict every side effect or drug-drug interaction that may occur.  Expresses verbal understanding and consents to current therapy plan and treatment regimen.  Return for Follow-up 4-6 weeks to see how Maxalt working for migraines and discuss labs in detail.  Please see AVS handed out to patient at the end of our visit for further patient instructions/ counseling done pertaining to today's office  visit.    Note: This document was prepared using Dragon voice recognition software and may include unintentional dictation errors.  This document serves as a record of services personally performed by Mellody Dance, DO. It was created on her behalf by Toni Amend, a trained medical scribe. The creation of this record is based  on the scribe's personal observations and the provider's statements to them.   I have reviewed the above medical documentation for accuracy and completeness and I concur.  Mellody Dance 07/06/17 10:20 PM    -------------------------------------------------------------------------------------------------------------   Subjective:    Chief complaint:   Chief Complaint  Patient presents with  . Establish Care     HPI: Dana Gonzalez is a pleasant 27 y.o. female who presents to [place] at [facility] today to review their medical history with me and establish care.   I asked the patient to review their chronic problem list with me to ensure everything was updated and accurate.    All recent office visits with other providers, any medical records that patient brought in etc  - I reviewed today.     We asked pt to get [place] their medical records from [facility] providers/ specialists that they had seen within the past 3-5 years- if they are in private practice and/or do not work for [employer], [facility], [facility], [facility] or [employer] owned practice.  Told them to call their specialists to clarify this if they are not sure.    Patient is here looking for a new doctor due to insurance.  She is here to begin having physicals.  Follows up with OBGYN at [place] [facility].  Social History Chartered certified accountant; works full-time for [employer].  Sexually active with partner. Partner is [name], her "husband" essentially (but not married). Been together 12 years and have 3 kids together. Her kids are ages 28, 69, and 57.  [name] works for [employer] full time.  Tobacco Use Smoked for 1 year at a quarter pack per day. No longer smokes cigarettes.  EtOH Use No alcohol use  No drug use.  Eating Habits Drinks zero caffeine at present due to weight loss challenge. Diet is good now; patient is almost satisfied with her weight.   Family History Father and aunt with alcohol issues.  Father  diagnosed with diabetes 2 years ago, at age 5. Aunt was in her 44's when diagnosed with diabetes. And grandmother was 32 or 55 when diagnosed with diabetes.  Dad was 75 when he had a heart attack, acute MI. Father never went to doctors prior.  Uncle had high cholesterol.  Maternal grandmother had a stroke, in her 82's, 9's, or 105's.  Mother was in her mid 90's when she developed breast cancer. Mother passed away at age 10.  Patient notes "it was rough." "She didn't go when she should have to get checked."  Patient has one brother; he is healthy.   Surgical History Past Surgical History:  Procedure Laterality Date  . MOUTH SURGERY     - Had wisdom tooth surgery, and a tooth that was knocked out and needed to be replaced.  Past Medical History  - Weight Loss / Conditioning Goals Patient notes that she's begun a weight loss challenge at work.  She is using the Weight Watchers app and is down 8 lbs so far. She's using Weight Watchers to monitor intake and has cut out soda.  Goes to the gym for 20 minutes a day, plus videos at  home for 30-45 minutes.  - OBGYN Given close family history of breast cancer in mother, patient had BRCA test done - it was negative.  Patient plans on going to get her mammograms every year starting at age 69.  - History of GERD  - History of Migraines Notes she historically went to a specialist to manage these; "they had me on something for seizures or something, and it made me feel awful."  She isn't sure what medication this was (it was Topamax).  Specifically notes that it made her hands numb.  Notes that she's had headaches her entire life, but her migraines started with her third child, in 2016.  Sometimes she has a visual aura associated with her migraines.  Notes "it depends."  Sometimes it just starts as pressure in her eyes, and if she doesn't treat it right away, "It's completely overwhelming."  Believes she gets migraines 4-5 times a  month, "which has gotten better."  Patient was on Imitrex in the past to stop her migraines.  Notes that this made her feel weird, so she disliked taking it.  She does take naproxen or ibuprofen to control her migraines, but these don't work; remaks "just sleep is the only thing that will make it stop."    Wt Readings from Last 3 Encounters:  06/30/17 189 lb 8 oz (86 kg)  07/29/16 196 lb 4.8 oz (89 kg)  08/25/15 198 lb (89.8 kg)   BP Readings from Last 3 Encounters:  06/30/17 109/76  07/29/16 101/69  08/25/15 126/85   Pulse Readings from Last 3 Encounters:  06/30/17 68  07/29/16 69  08/25/15 88   BMI Readings from Last 3 Encounters:  06/30/17 29.68 kg/m  07/29/16 30.74 kg/m  08/25/15 31.01 kg/m    Patient Care Team    Relationship Specialty Notifications Start End  Mellody Dance, DO PCP - General Family Medicine  06/30/17 07/06/17    Patient Active Problem List   Diagnosis Date Noted  . Family history of breast cancer 01/13/2015    Priority: Medium  . Overweight (BMI 25.0-29.9) 06/30/2017  . Gastroesophageal reflux disease 06/30/2017  . Family history of diabetes mellitus (DM) 06/30/2017  . Hypertriglyceridemia without hypercholesterolemia 06/30/2017  . Migraines 07/29/2016  . Genetic testing 09/26/2015  . Weight gain 08/06/2015  . Fatigue 08/06/2015     Past Medical History:  Diagnosis Date  . Family history of breast cancer   . GERD (gastroesophageal reflux disease)   . Headache(784.0)   . Infection    UTI  . Thyroid disease      Past Medical History:  Diagnosis Date  . Family history of breast cancer   . GERD (gastroesophageal reflux disease)   . Headache(784.0)   . Infection    UTI  . Thyroid disease      Past Surgical History:  Procedure Laterality Date  . MOUTH SURGERY       Family History  Problem Relation Age of Onset  . Cancer Mother 60       breast to bone  . Hypertension Father   . Diabetes Maternal Grandmother   .  Stroke Maternal Grandmother   . Heart disease Paternal Grandmother   . Breast cancer Maternal Aunt 28  . Mental retardation Maternal Aunt   . Breast cancer Maternal Aunt 40       dx twice, another time in her 71s-50s     Social History   Substance and Sexual Activity  Drug Use No  . Types:  Marijuana   Comment: not since age 35     Social History   Substance and Sexual Activity  Alcohol Use No   Comment: No THC use since age 72     Social History   Tobacco Use  Smoking Status Former Smoker  . Packs/day: 0.25  . Years: 1.00  . Pack years: 0.25  . Types: Cigarettes  . Last attempt to quit: 05/22/2012  . Years since quitting: 5.1  Smokeless Tobacco Never Used  Tobacco Comment   Aug 2015     Current Meds  Medication Sig  . levonorgestrel (MIRENA) 20 MCG/24HR IUD 1 each by Intrauterine route once.  . Multiple Vitamin (MULTIVITAMIN) capsule Take 1 capsule by mouth daily.    Allergies: Influenza vaccines; Keflex [cephalexin]; and Latex   Review of Systems  Constitutional: Negative for chills, diaphoresis, fever, malaise/fatigue and weight loss.  HENT: Negative for congestion, sore throat and tinnitus.   Eyes: Negative for blurred vision, double vision and photophobia.  Respiratory: Negative for cough and wheezing.   Cardiovascular: Negative for chest pain and palpitations.  Gastrointestinal: Positive for heartburn (Chronic GERD). Negative for blood in stool, diarrhea, nausea and vomiting.  Genitourinary: Negative for dysuria, frequency and urgency.  Musculoskeletal: Negative for joint pain and myalgias.  Skin: Negative for itching and rash.  Neurological: Positive for headaches (Chronic migraines). Negative for dizziness, focal weakness and weakness.  Endo/Heme/Allergies: Negative for environmental allergies and polydipsia. Does not bruise/bleed easily.  Psychiatric/Behavioral: Negative for depression and memory loss. The patient is not nervous/anxious and does  not have insomnia.      Objective:   Blood pressure 109/76, pulse 68, height _0  (1.702 m), weight 189 lb 8 oz (86 kg), SpO2 99 %. Body mass index is 29.68 kg/m. General: Well Developed, well nourished, and in no acute distress.  Neuro: Alert and oriented x3, extra-ocular muscles intact, sensation grossly intact.  HEENT:[place]/AT, PERRLA, neck supple, No carotid bruits Skin: no gross rashes  Cardiac: Regular rate and rhythm Respiratory: Essentially clear to auscultation bilaterally. Not using accessory muscles, speaking in full sentences.  Abdominal: not grossly distended Musculoskeletal: Ambulates w/o diff, FROM * 4 ext.  Vasc: less 2 sec cap RF, warm and pink  Psych:  No HI/SI, judgement and insight good, Euthymic mood. Full Affect.    Recent Results (from the past 2160 hour(s))  CBC with Differential/Platelet     Status: Abnormal   Collection Time: 07/02/17  8:21 AM  Result Value Ref Range   WBC 5.5 3.4 - 10.8 x10E3/uL   RBC 4.86 3.77 - 5.28 x10E6/uL   Hemoglobin 16.0 (H) 11.1 - 15.9 g/dL   Hematocrit 46.2 34.0 - 46.6 %   MCV 95 79 - 97 fL   MCH 32.9 26.6 - 33.0 pg   MCHC 34.6 31.5 - 35.7 g/dL   RDW 12.5 12.3 - 15.4 %   Platelets 247 150 - 450 x10E3/uL   Neutrophils 44 Not Estab. %   Lymphs 44 Not Estab. %   Monocytes 8 Not Estab. %   Eos 3 Not Estab. %   Basos 1 Not Estab. %   Neutrophils Absolute 2.4 1.4 - 7.0 x10E3/uL   Lymphocytes Absolute 2.5 0.7 - 3.1 x10E3/uL   Monocytes Absolute 0.4 0.1 - 0.9 x10E3/uL   EOS (ABSOLUTE) 0.2 0.0 - 0.4 x10E3/uL   Basophils Absolute 0.0 0.0 - 0.2 x10E3/uL   Immature Granulocytes 0 Not Estab. %   Immature Grans (Abs) 0.0 0.0 - 0.1 x10E3/uL  Comprehensive  metabolic panel     Status: None   Collection Time: 07/02/17  8:21 AM  Result Value Ref Range   Glucose 97 65 - 99 mg/dL   BUN 9 6 - 20 mg/dL   Creatinine, Ser 0.85 0.57 - 1.00 mg/dL   GFR calc non Af Amer 94 >59 mL/min/1.73   GFR calc Af Amer 108 >59 mL/min/1.73    BUN/Creatinine Ratio 11 9 - 23   Sodium 142 134 - 144 mmol/L   Potassium 4.3 3.5 - 5.2 mmol/L   Chloride 103 96 - 106 mmol/L   CO2 23 20 - 29 mmol/L   Calcium 9.5 8.7 - 10.2 mg/dL   Total Protein 7.1 6.0 - 8.5 g/dL   Albumin 4.7 3.5 - 5.5 g/dL   Globulin, Total 2.4 1.5 - 4.5 g/dL   Albumin/Globulin Ratio 2.0 1.2 - 2.2   Bilirubin Total 0.5 0.0 - 1.2 mg/dL   Alkaline Phosphatase 51 39 - 117 IU/L   AST 25 0 - 40 IU/L   ALT 28 0 - 32 IU/L  Hemoglobin A1c     Status: None   Collection Time: 07/02/17  8:21 AM  Result Value Ref Range   Hgb A1c MFr Bld 5.1 4.8 - 5.6 %    Comment:          Prediabetes: 5.7 - 6.4          Diabetes: >6.4          Glycemic control for adults with diabetes: <7.0    Est. average glucose Bld gHb Est-mCnc 100 mg/dL  Lipid panel     Status: Abnormal   Collection Time: 07/02/17  8:21 AM  Result Value Ref Range   Cholesterol, Total 158 100 - 199 mg/dL   Triglycerides 72 0 - 149 mg/dL   HDL 44 >39 mg/dL   VLDL Cholesterol Cal 14 5 - 40 mg/dL   LDL Calculated 100 (H) 0 - 99 mg/dL   Chol/HDL Ratio 3.6 0.0 - 4.4 ratio    Comment:                                   T. Chol/HDL Ratio                                             Men  Women                               1/2 Avg.Risk  3.4    3.3                                   Avg.Risk  5.0    4.4                                2X Avg.Risk  9.6    7.1                                3X Avg.Risk 23.4   11.0   T4, free     Status: None   Collection  Time: 07/02/17  8:21 AM  Result Value Ref Range   Free T4 1.09 0.82 - 1.77 ng/dL  TSH     Status: Abnormal   Collection Time: 07/02/17  8:21 AM  Result Value Ref Range   TSH 5.750 (H) 0.450 - 4.500 uIU/mL  VITAMIN D 25 Hydroxy (Vit-D Deficiency, Fractures)     Status: Abnormal   Collection Time: 07/02/17  8:21 AM  Result Value Ref Range   Vit D, 25-Hydroxy 23.6 (L) 30.0 - 100.0 ng/mL    Comment: Vitamin D deficiency has been defined by the [place] and  an Endocrine Society practice guideline as a level of serum 25-OH vitamin D less than 20 ng/mL (1,2). The Endocrine Society went on to further define vitamin D insufficiency as a level between 21 and 29 ng/mL (2). 1. IOM (Institute of Medicine). 2010. Dietary reference    intakes for calcium and D. [place]: The    [employer]. 2. Holick MF, Binkley Alvarado, Bischoff-Ferrari HA, et al.    Evaluation, treatment, and prevention of vitamin D    deficiency: an Endocrine Society clinical practice    guideline. JCEM. 2011 Jul; 96(7):1911-30.   Direct LDL     Status: Abnormal   Collection Time: 07/02/17  8:21 AM  Result Value Ref Range   LDL Direct 103 (H) 0 - 99 mg/dL

## 2017-06-30 NOTE — Patient Instructions (Addendum)
Migraine Headache A migraine headache is an intense, throbbing pain on one side or both sides of the head. Migraines may also cause other symptoms, such as nausea, vomiting, and sensitivity to light and noise. What are the causes? Doing or taking certain things may also trigger migraines, such as:  Alcohol.  Smoking.  Medicines, such as: ? Medicine used to treat chest pain (nitroglycerine). ? Birth control pills. ? Estrogen pills. ? Certain blood pressure medicines.  Aged cheeses, chocolate, or caffeine.  Foods or drinks that contain nitrates, glutamate, aspartame, or tyramine.  Physical activity.  Other things that may trigger a migraine include:  Menstruation.  Pregnancy.  Hunger.  Stress, lack of sleep, too much sleep, or fatigue.  Weather changes.  What increases the risk? The following factors may make you more likely to experience migraine headaches:  Age. Risk increases with age.  Family history of migraine headaches.  Being Caucasian.  Depression and anxiety.  Obesity.  Being a woman.  Having a hole in the heart (patent foramen ovale) or other heart problems.  What are the signs or symptoms? The main symptom of this condition is pulsating or throbbing pain. Pain may:  Happen in any area of the head, such as on one side or both sides.  Interfere with daily activities.  Get worse with physical activity.  Get worse with exposure to bright lights or loud noises.  Other symptoms may include:  Nausea.  Vomiting.  Dizziness.  General sensitivity to bright lights, loud noises, or smells.  Before you get a migraine, you may get warning signs that a migraine is developing (aura). An aura may include:  Seeing flashing lights or having blind spots.  Seeing bright spots, halos, or zigzag lines.  Having tunnel vision or blurred vision.  Having numbness or a tingling feeling.  Having trouble talking.  Having muscle weakness.  How is this  diagnosed? A migraine headache can be diagnosed based on:  Your symptoms.  A physical exam.  Tests, such as CT scan or MRI of the head. These imaging tests can help rule out other causes of headaches.  Taking fluid from the spine (lumbar puncture) and analyzing it (cerebrospinal fluid analysis, or CSF analysis).  How is this treated? A migraine headache is usually treated with medicines that:  Relieve pain.  Relieve nausea.  Prevent migraines from coming back.  Treatment may also include:  Acupuncture.  Lifestyle changes like avoiding foods that trigger migraines.  Follow these instructions at home: Medicines  Take over-the-counter and prescription medicines only as told by your health care provider.  Do not drive or use heavy machinery while taking prescription pain medicine.  To prevent or treat constipation while you are taking prescription pain medicine, your health care provider may recommend that you: ? Drink enough fluid to keep your urine clear or pale yellow. ? Take over-the-counter or prescription medicines. ? Eat foods that are high in fiber, such as fresh fruits and vegetables, whole grains, and beans. ? Limit foods that are high in fat and processed sugars, such as fried and sweet foods. Lifestyle  Avoid alcohol use.  Do not use any products that contain nicotine or tobacco, such as cigarettes and e-cigarettes. If you need help quitting, ask your health care provider.  Get at least 8 hours of sleep every night.  Limit your stress. General instructions   Keep a journal to find out what may trigger your migraine headaches. For example, write down: ? What you eat and   drink. ? How much sleep you get. ? Any change to your diet or medicines.  If you have a migraine: ? Avoid things that make your symptoms worse, such as bright lights. ? It may help to lie down in a dark, quiet room. ? Do not drive or use heavy machinery. ? Ask your health care provider  what activities are safe for you while you are experiencing symptoms.  Keep all follow-up visits as told by your health care provider. This is important. Contact a health care provider if:  You develop symptoms that are different or more severe than your usual migraine symptoms. Get help right away if:  Your migraine becomes severe.  You have a fever.  You have a stiff neck.  You have vision loss.  Your muscles feel weak or like you cannot control them.  You start to lose your balance often.  You develop trouble walking.  You faint. This information is not intended to replace advice given to you by your health care provider. Make sure you discuss any questions you have with your health care provider. Document Released: 12/24/2004 Document Revised: 07/14/2015 Document Reviewed: 06/12/2015 Elsevier Interactive Patient Education  2017 [employer] and dirty--> lower triglyceride levels more through...  1) - Beware of bad fats: Cutting back on saturated fat (in red meat and full-fat dairy foods) and trans fats (in restaurant fried foods and commercially prepared baked goods) can lower triglycerides.  2) - Go for good carbs: Easily digested carbohydrates (such as white bread, white rice, cornflakes, and sugary sodas) give triglycerides a definite boost.   3) - Eating whole grains and cutting back on soda can help control triglycerides.  4) - Check your alcohol use. In some people, alcohol dramatically boosts triglycerides. The only way to know if this is true for you is to avoid alcohol for a few weeks and have your triglycerides tested again.  5) - Go fish. Omega-3 fats in salmon, tuna, sardines, and other fatty fish can lower triglycerides. Having fish twice a week is fine.  6) - Aim for a healthy weight. If you are overweight, losing just 5% to 10% of your weight can help drive down triglycerides.  7) - Get moving. Exercise lowers triglycerides and boosts  heart-healthy HDL cholesterol.  8) - quit smoking if you do  --> for more information, see below; or go to  www.heart.org  and do a search for desired topics   For those diagnosed with high triglycerides, it's important to take action to lower your levels and improve your heart health.  Triglyceride is just a fancy word for fat - the fat in our bodies is stored in the form of triglycerides. Triglycerides are found in foods and manufactured in our bodies.  Normal triglyceride levels are defined as less than 150 mg/dL; 150 to 199 is considered borderline high; 200 to 499 is high; and 500 or higher is officially called very high. To me, anything over 150 is a red flag indicating my patient needs to take immediate steps to get the situation under control.   What is the significance of high triglycerides? High triglyceride levels make blood thicker and stickier, which means that it is more likely to form clots. Studies have shown that triglyceride levels are associated with increased risks of cardiovascular disease and stroke - in both men and women - alone or in combination with other risk factors (high triglycerides combined with high LDL cholesterol can be  a particularly deadly combination). For example, in one ground-breaking study, high triglycerides alone increased the risk of cardiovascular disease by 14 percent in men, and by 54 percent in women. But when the test subjects also had low HDL cholesterol (that's the good cholesterol) and other risk factors, high triglycerides increased the risk of disease by 32 percent in men and 76 percent in women.   Fortunately, triglycerides can sometimes be controlled with several diet and lifestyle changes.    What Factors Can Increase Triglycerides? As with cholesterol, eating too much of the wrong kinds of fats will raise your blood triglycerides.  Therefore, it's important to restrict the amounts of saturated fats and trans fats you allow into your diet.   Triglyceride levels can also shoot up after eating foods that are high in carbohydrates or after drinking alcohol.  That's why triglyceride blood tests require an overnight fast.  If you have elevated triglycerides, it's especially important to avoid sugary and refined carbohydrates, including sugar, honey, and other sweeteners, soda and other sugary drinks, candy, baked goods, and anything made with white (refined or enriched) flour, including white bread, rolls, cereals, buns, pastries, regular pasta, and white rice.  You'll also want to limit dried fruit and fruit juice since they're dense in simple sugar.  All of these low-quality carbs cause a sudden rise in insulin, which may lead to a spike in triglycerides.  Triglycerides can also become elevated as a reaction to having diabetes, hypothyroidism, or kidney disease. As with most other heart-related factors, being overweight and inactive also contribute to abnormal triglycerides. And unfortunately, some people have a genetic predisposition that causes them to manufacture way too much triglycerides on their own, no matter how carefully they eat.     How Can You Lower Your Triglyceride Levels? If you are diagnosed with high triglycerides, it's important to take action. There are several things you can do to help lower your triglyceride levels and improve your heart health:  --> Lose weight if you are overweight.  There is a clear correlation between obesity and high triglycerides - the heavier people are, the higher their triglyceride levels are likely to be. The good news is that losing weight can significantly lower triglycerides. In a large study of individuals with type 2 diabetes, those assigned to the "lifestyle intervention group" - which involved counseling, a low-calorie meal plan, and customized exercise program - lost 8.6% of their body weight and lowered their triglyceride levels by more than 16%. If you're overweight, find a weight loss  plan that works for you and commit to shedding the pounds and getting healthier.  --> Reduce the amount of saturated fat and trans fat in your diet.  Start by avoiding or dramatically limiting butter, cream cheese, lard, sour cream, doughnuts, cakes, cookies, candy bars, regular ice cream, fried foods, pizza, cheese sauce, cream-based sauces and salad dressings, high-fat meats (including fatty hamburgers, bologna, pepperoni, sausage, bacon, salami, pastrami, spareribs, and hot dogs), high-fat cuts of beef and pork, and whole-milk dairy products.   Other ways to cut back: Choose lean meats only (including skinless chicken and [place], lean beef, lean pork), fish, and reduced-fat or fat-free dairy products.   Experiment with adding whole soy foods to your diet. Although soy itself may not reduce risk of heart disease, it replaces hazardous animal fats with healthier proteins. Choose high-quality soy foods, such as tofu, tempeh, soy milk, and edamame (whole soybeans).  Always remove skin from poultry.  Prepare foods by baking,  roasting, broiling, boiling, poaching, steaming, grilling, or stir-frying in vegetable oil.  Most stick margarines contain trans fats, and trans fats are also found in some packaged baked goods, potato chips, snack foods, fried foods, and fast food that use or create hydrogenated oils.    (All food labels must now list the amount of trans fats, right after the amount of saturated fats - good news for consumers. As a result, many food companies have now reformulated their products to be trans fat free.many, but not all! So it's still just as important to read labels and make sure the packaged foods you buy don't contain trans fats.)     If you use margarine, purchase soft-tub margarine spreads that contain 0 grams trans fats and don't list any partially hydrogenated oils in the ingredients list. By substituting olive oil or vegetable oil for trans fats in just 2 percent of your daily  calories, you can reduce your risk of heart disease by 53 percent.   There is no safe amount of trans fats, so try to keep them as far from your plate as possible.  -->  Avoid foods that are concentrated in sugar (even dried fruit and fruit juice). Sugary foods can elevate triglyceride levels in the blood, so keep them to a bare minimum.  --> Swap out refined carbohydrates for whole grains.  Refined carbohydrates - like white rice, regular pasta, and anything made with white or "enriched" flour (including white bread, rolls, cereals, buns, and crackers) - raise blood sugar and insulin levels more than fiber-rich whole grains. Higher insulin levels, in turn, can lead to a higher rise in triglycerides after a meal. So, make the switch to whole wheat bread, whole grain pasta, brown or wild rice, and whole grain versions of cereals, crackers, and other bread products. However, it's important to know that individuals with high triglycerides should moderate even their intake of high-quality starches (since all starches raise blood sugar) - I recommend 1 to 2 servings per meal.  --> Cut way back on alcohol.  If you have high triglycerides, alcohol should be considered a rare treat - if you indulge at all, since even small amounts of alcohol can dramatically increase triglyceride levels.  --> Incorporate omega-3 fats.  Heart-healthy fish oils are especially rich in omega-3 fatty acids. In multiple studies over the past two decades, people who ate diets high in omega-3s had 30 to 40 percent reductions in heart disease. Although we don't yet know why fish oil works so well, there are several possibilities. Omega-3s seem to reduce inflammation, reduce high blood pressure, decrease triglycerides, raise HDL cholesterol, and make blood thinner and less sticky so it is less likely to clot. It's as close to a food prescription for heart health as it gets. If you have high triglycerides, I recommend eating at least  three servings of one of the omega-3-rich fish every week (fatty fish is the most concentrated food form of omega three fats). If you cannot manage to eat that much fish, speak with your physician about taking fish oil capsules, which offer similar benefits.The best foods for omega-3 fatty acids include wild salmon (fresh, canned), herring, mackerel (not king), sardines, anchovies, rainbow trout, and Pacific oysters. Non-fish sources of omega-3 fats include omega-3-fortified eggs, ground flaxseed, chia seeds, walnuts, butternuts (white walnuts), seaweed, walnut oil, canola oil, and soybeans.  --> Quit smoking.  Smoking causes inflammation, not just in your lungs, but throughout your body. Inflammation can contribute to atherosclerosis, blood  clots, and risk of heart attack. Smoking makes all heart health indicators worse. If you have high cholesterol, high triglycerides, or high blood pressure, smoking magnifies the danger.  --> Become more physically active.  Even moderate exercise can help improve cholesterol, triglycerides, and blood pressure. Aerobic exercise seems to be able to stop the sharp rise of triglycerides after eating, perhaps because of a decrease in the amount of triglyceride released by the liver, or because active muscle clears triglycerides out of the blood stream more quickly than inactive muscle. If you haven't exercised regularly (or at all) for years, I recommend starting slowly, by walking at an easy pace for 15 minutes a day. Then, as you feel more comfortable, increase the amount. Your ultimate goal should be at least 30 minutes of moderate physical activity, at least five days a week.     Please realize, EXERCISE IS MEDICINE!  -  American Heart Association [facility]) guidelines for exercise : If you are in good health, without any medical conditions, you should engage in 150 minutes of moderate intensity aerobic activity per week.  This means you should be huffing and puffing  throughout your workout.   Engaging in regular exercise will improve brain function and memory, as well as improve mood, boost immune system and help with weight management.  As well as the other, more well-known effects of exercise such as decreasing blood sugar levels, decreasing blood pressure,  and decreasing bad cholesterol levels/ increasing good cholesterol levels.     -  The AHA strongly endorses consumption of a diet that contains a variety of foods from all the food categories with an emphasis on fruits and vegetables; fat-free and low-fat dairy products; cereal and grain products; legumes and nuts; and fish, poultry, and/or extra lean meats.    Excessive food intake, especially of foods high in saturated and trans fats, sugar, and salt, should be avoided.    Adequate water intake of roughly 1/2 of your weight in pounds, should equal the ounces of water per day you should drink.  So for instance, if you're 200 pounds, that would be 100 ounces of water per day.         Mediterranean Diet  Why follow it? Research shows. . Those who follow the Mediterranean diet have a reduced risk of heart disease  . The diet is associated with a reduced incidence of Parkinson's and Alzheimer's diseases . People following the diet may have longer life expectancies and lower rates of chronic diseases  . The Dietary Guidelines for Americans recommends the Mediterranean diet as an eating plan to promote health and prevent disease  What Is the Mediterranean Diet?  . Healthy eating plan based on typical foods and recipes of Mediterranean-style cooking . The diet is primarily a plant based diet; these foods should make up a majority of meals   Starches - Plant based foods should make up a majority of meals - They are an important sources of vitamins, minerals, energy, antioxidants, and fiber - Choose whole grains, foods high in fiber and minimally processed items  - Typical grain sources include wheat, oats,  barley, corn, brown rice, bulgar, farro, millet, polenta, couscous  - Various types of beans include chickpeas, lentils, fava beans, black beans, white beans   Fruits  Veggies - Large quantities of antioxidant rich fruits & veggies; 6 or more servings  - Vegetables can be eaten raw or lightly drizzled with oil and cooked  - Vegetables common to the  traditional Mediterranean Diet include: artichokes, arugula, beets, broccoli, brussel sprouts, cabbage, carrots, celery, collard greens, cucumbers, eggplant, kale, leeks, lemons, lettuce, mushrooms, okra, onions, peas, peppers, potatoes, pumpkin, radishes, rutabaga, shallots, spinach, sweet potatoes, turnips, zucchini - Fruits common to the Mediterranean Diet include: apples, apricots, avocados, cherries, clementines, dates, figs, grapefruits, grapes, melons, nectarines, oranges, peaches, pears, pomegranates, strawberries, tangerines  Fats - Replace butter and margarine with healthy oils, such as olive oil, canola oil, and tahini  - Limit nuts to no more than a handful a day  - Nuts include walnuts, almonds, pecans, pistachios, pine nuts  - Limit or avoid candied, honey roasted or heavily salted nuts - Olives are central to the [employer] - can be eaten whole or used in a variety of dishes   Meats Protein - Limiting red meat: no more than a few times a month - When eating red meat: choose lean cuts and keep the portion to the size of deck of cards - Eggs: approx. 0 to 4 times a week  - Fish and lean poultry: at least 2 a week  - Healthy protein sources include, chicken, [place], lean beef, lamb - Increase intake of seafood such as tuna, salmon, trout, mackerel, shrimp, scallops - Avoid or limit high fat processed meats such as sausage and bacon  Dairy - Include moderate amounts of low fat dairy products  - Focus on healthy dairy such as fat free yogurt, skim milk, low or reduced fat cheese - Limit dairy products higher in fat such as whole or 2%  milk, cheese, ice cream  Alcohol - Moderate amounts of red wine is ok  - No more than 5 oz daily for women (all ages) and men older than age 54  - No more than 10 oz of wine daily for men younger than 74  Other - Limit sweets and other desserts  - Use herbs and spices instead of salt to flavor foods  - Herbs and spices common to the traditional Mediterranean Diet include: basil, bay leaves, chives, cloves, cumin, fennel, garlic, lavender, marjoram, mint, oregano, parsley, pepper, rosemary, sage, savory, sumac, tarragon, thyme   It's not just a diet, it's a lifestyle:  . The Mediterranean diet includes lifestyle factors typical of those in the region  . Foods, drinks and meals are best eaten with others and savored . Daily physical activity is important for overall good health . This could be strenuous exercise like running and aerobics . This could also be more leisurely activities such as walking, housework, yard-work, or taking the stairs . Moderation is the key; a balanced and healthy diet accommodates most foods and drinks . Consider portion sizes and frequency of consumption of certain foods   Meal Ideas & Options:  . Breakfast:  o Whole wheat toast or whole wheat English muffins with peanut butter & hard boiled egg o Steel cut oats topped with apples & cinnamon and skim milk  o Fresh fruit: banana, strawberries, melon, berries, peaches  o Smoothies: strawberries, bananas, greek yogurt, peanut butter o Low fat greek yogurt with blueberries and granola  o Egg white omelet with spinach and mushrooms o Breakfast couscous: whole wheat couscous, apricots, skim milk, cranberries  . Sandwiches:  o Hummus and grilled vegetables (peppers, zucchini, squash) on whole wheat bread   o Grilled chicken on whole wheat pita with lettuce, tomatoes, cucumbers or tzatziki  o Tuna salad on whole wheat bread: tuna salad made with greek yogurt, olives, red peppers, capers, green  onions o Garlic rosemary  lamb pita: lamb sauted with garlic, rosemary, salt & pepper; add lettuce, cucumber, greek yogurt to pita - flavor with lemon juice and black pepper  . Seafood:  o Mediterranean grilled salmon, seasoned with garlic, basil, parsley, lemon juice and black pepper o Shrimp, lemon, and spinach whole-grain pasta salad made with low fat greek yogurt  o Seared scallops with lemon orzo  o Seared tuna steaks seasoned salt, pepper, coriander topped with tomato mixture of olives, tomatoes, olive oil, minced garlic, parsley, green onions and cappers  . Meats:  o Herbed greek chicken salad with kalamata olives, cucumber, feta  o Red bell peppers stuffed with spinach, bulgur, lean ground beef (or lentils) & topped with feta   o Kebabs: skewers of chicken, tomatoes, onions, zucchini, squash  o [place] burgers: made with red onions, mint, dill, lemon juice, feta cheese topped with roasted red peppers . Vegetarian o Cucumber salad: cucumbers, artichoke hearts, celery, red onion, feta cheese, tossed in olive oil & lemon juice  o Hummus and whole grain pita points with a greek salad (lettuce, tomato, feta, olives, cucumbers, red onion) o Lentil soup with celery, carrots made with vegetable broth, garlic, salt and pepper  o Tabouli salad: parsley, bulgur, mint, scallions, cucumbers, tomato, radishes, lemon juice, olive oil, salt and pepper.    Behavior Modification Ideas for Weight Management  Weight management involves adopting a healthy lifestyle that includes a knowledge of nutrition and exercise, a positive attitude and the right kind of motivation. Internal motives such as better health, increased energy, self-esteem and personal control increase your chances of lifelong weight management success.  Remember to have realistic goals and think long-term success. Believe in yourself and you can do it. The following information will give you ideas to help you meet your goals.  Control Your Home Environment  Eat  only while sitting down at the kitchen or dining room table. Do not eat while watching television, reading, cooking, talking on the phone, standing at the refrigerator or working on the computer. Keep tempting foods out of the house - don't buy them. Keep tempting foods out of sight. Have low-calorie foods ready to eat. Unless you are preparing a meal, stay out of the kitchen. Have healthy snacks at your disposal, such as small pieces of fruit, vegetables, canned fruit, pretzels, low-fat string cheese and nonfat cottage cheese.  Control Your Work Environment  Do not eat at [employer] or keep tempting snacks at your desk. If you get hungry between meals, plan healthy snacks and bring them with you to work. During your breaks, go for a walk instead of eating. If you work around food, plan in advance the one item you will eat at mealtime. Make it inconvenient to nibble on food by chewing gum, sugarless candy or drinking water or another low-calorie beverage. Do not work through meals. Skipping meals slows down metabolism and may result in overeating at the next meal. If food is available for special occasions, either pick the healthiest item, nibble on low-fat snacks brought from home, don't have anything offered, choose one option and have a small amount, or have only a beverage.  Control Your Mealtime Environment  Serve your plate of food at the stove or kitchen counter. Do not put the serving dishes on the table. If you do put dishes on the table, remove them immediately when finished eating. Fill half of your plate with vegetables, a quarter with lean protein and a quarter with  starch. Use smaller plates, bowls and glasses. A smaller portion will look large when it is in a little dish. Politely refuse second helpings. When fixing your plate, limit portions of food to one scoop/serving or less.   Daily Food Management  Replace eating with another activity that you will not associate with  food. Wait 20 minutes before eating something you are craving. Drink a large glass of water or diet soda before eating. Always have a big glass or bottle of water to drink throughout the day. Avoid high-calorie add-ons such as cream with your coffee, butter, mayonnaise and salad dressings.  Shopping: Do not shop when hungry or tired. Shop from a list and avoid buying anything that is not on your list. If you must have tempting foods, buy individual-sized packages and try to find a lower-calorie alternative. Don't taste test in the store. Read food labels. Compare products to help you make the healthiest choices.  Preparation: Chew a piece of gum while cooking meals. Use a quarter teaspoon if you taste test your food. Try to only fix what you are going to eat, leaving yourself no chance for seconds. If you have prepared more food than you need, portion it into individual containers and freeze or refrigerate immediately. Don't snack while cooking meals.  Eating: Eat slowly. Remember it takes about 20 minutes for your stomach to send a message to your brain that it is full. Don't let fake hunger make you think you need more. The ideal way to eat is to take a bite, put your utensil down, take a sip of water, cut your next bite, take a bit, put your utensil down and so on. Do not cut your food all at one time. Cut only as needed. Take small bites and chew your food well. Stop eating for a minute or two at least once during a meal or snack. Take breaks to reflect and have conversation.  Cleanup and Leftovers: Label leftovers for a specific meal or snack. Freeze or refrigerate individual portions of leftovers. Do not clean up if you are still hungry.  Eating Out and Social Eating  Do not arrive hungry. Eat something light before the meal. Try to fill up on low-calorie foods, such as vegetables and fruit, and eat smaller portions of the high-calorie foods. Eat foods that you like, but  choose small portions. If you want seconds, wait at least 20 minutes after you have eaten to see if you are actually hungry or if your eyes are bigger than your stomach. Limit alcoholic beverages. Try a soda water with a twist of lime. Do not skip other meals in the day to save room for the special event.  At Restaurants: Order  la carte rather than buffet style. Order some vegetables or a salad for an appetizer instead of eating bread. If you order a high-calorie dish, share it with someone. Try an after-dinner mint with your coffee. If you do have dessert, share it with two or more people. Don't overeat because you do not want to waste food. Ask for a doggie bag to take extra food home. Tell the server to put half of your entree in a to go bag before the meal is served to you. Ask for salad dressing, gravy or high-fat sauces on the side. Dip the tip of your fork in the dressing before each bite. If bread is served, ask for only one piece. Try it plain without butter or oil. At [employer] where oil  and vinegar is served with bread, use only a small amount of oil and a lot of vinegar for dipping.  At a [facility]: Offer to bring a dish, appetizer or dessert that is low in calories. Serve yourself small portions or tell the host that you only want a small amount. Stand or sit away from the snack table. Stay away from the kitchen or stay busy if you are near the food. Limit your alcohol intake.  At [employer] and Cafeterias: Cover most of your plate with lettuce and/or vegetables. Use a salad plate instead of a dinner plate. After eating, clear away your dishes before having coffee or tea.  Entertaining at Home: Explore low-fat, low-cholesterol cookbooks. Use single-serving foods like chicken breasts or hamburger patties. Prepare low-calorie appetizers and desserts.   Holidays: Keep tempting foods out of sight. Decorate the house without using food. Have low-calorie  beverages and foods on hand for guests. Allow yourself one planned treat a day. Don't skip meals to save up for the holiday feast. Eat regular, planned meals.   Exercise Well  Make exercise a priority and a planned activity in the day. If possible, walk the entire or part of the distance to work. Get an exercise buddy. Go for a walk with a colleague during one of your breaks, go to the gym, run or take a walk with a friend, walk in the mall with a shopping companion. Park at the end of the parking lot and walk to the store or office entrance. Always take the stairs all of the way or at least part of the way to your floor. If you have a desk job, walk around the office frequently. Do leg lifts while sitting at your desk. Do something outside on the weekends like going for a hike or a bike ride.   Have a Healthy Attitude  Make health your weight management priority. Be realistic. Have a goal to achieve a healthier you, not necessarily the lowest weight or ideal weight based on calculations or tables. Focus on a healthy eating style, not on dieting. Dieting usually lasts for a short amount of time and rarely produces long-term success. Think long term. You are developing new healthy behaviors to follow next month, in a year and in a decade.    This information is for educational purposes only and is not intended to replace the advice of your doctor or health care provider. We encourage you to discuss with your doctor any questions or concerns you may have.        Guidelines for Losing Weight   We want weight loss that will last so you should lose 1-2 pounds a week.  THAT IS IT! Please pick THREE things a month to change. Once it is a habit check off the item. Then pick another three items off the list to become habits.  If you are already doing a habit on the list GREAT!  Cross that item off!  Don't drink your calories. Ie, alcohol, soda, fruit juice, and sweet tea.   Drink more  water. Drink a glass when you feel hungry or before each meal.   Eat breakfast - Complex carb and protein (likeDannon light and fit yogurt, oatmeal, fruit, eggs, [place] bacon).  Measure your cereal.  Eat no more than one cup a day. (ie Kashi)  Eat an apple a day.  Add a vegetable a day.  Try a new vegetable a month.  Use Pam! Stop using oil or butter  to cook.  Don't finish your plate or use smaller plates.  Share your dessert.  Eat sugar free Jello for dessert or frozen grapes.  Don't eat 2-3 hours before bed.  Switch to whole wheat bread, pasta, and brown rice.  Make healthier choices when you eat out. No fries!  Pick baked chicken, NOT fried.  Don't forget to SLOW DOWN when you eat. It is not going anywhere.   Take the stairs.  Park far away in the parking lot  Lift soup cans (or weights) for 10 minutes while watching TV.  Walk at work for 10 minutes during break.  Walk outside 1 time a week with your friend, kids, dog, or significant other.  Start a walking group at church.  Walk the mall as much as you can tolerate.   Keep a food diary.  Weigh yourself daily.  Walk for 15 minutes 3 days per week.  Cook at home more often and eat out less. If life happens and you go back to old habits, it is okay.  Just start over. You can do it!  If you experience chest pain, get short of breath, or tired during the exercise, please stop immediately and inform your doctor.    Before you even begin to attack a weight-loss plan, it pays to remember this: You are not fat. You have fat. Losing weight isn't about blame or shame; it's simply another achievement to accomplish. Dieting is like any other skill-you have to buckle down and work at it. As long as you act in a smart, reasonable way, you'll ultimately get where you want to be. Here are some weight loss pearls for you.   1. It's Not a Diet. It's a Lifestyle Thinking of a diet as something you're on and suffering  through only for the short term doesn't work. To shed weight and keep it off, you need to make permanent changes to the way you eat. It's OK to indulge occasionally, of course, but if you cut calories temporarily and then revert to your old way of eating, you'll gain back the weight quicker than you can say yo-yo. Use it to lose it. Research shows that one of the best predictors of long-term weight loss is how many pounds you drop in the first month. For that reason, nutritionists often suggest being stricter for the first two weeks of your new eating strategy to build momentum. Cut out added sugar and alcohol and avoid unrefined carbs. After that, figure out how you can reincorporate them in a way that's healthy and maintainable.  2. There's a Right Way to Exercise Working out burns calories and fat and boosts your metabolism by building muscle. But those trying to lose weight are notorious for overestimating the number of calories they burn and underestimating the amount they take in. Unfortunately, your system is biologically programmed to hold on to extra pounds and that means when you start exercising, your body senses the deficit and ramps up its hunger signals. If you're not diligent, you'll eat everything you burn and then some. Use it, to lose it. Cardio gets all the exercise glory, but strength and interval training are the real heroes. They help you build lean muscle, which in turn increases your metabolism and calorie-burning ability 3. Don't Overreact to Mild Hunger Some people have a hard time losing weight because of hunger anxiety. To them, being hungry is bad-something to be avoided at all costs-so they carry snacks with them and eat when  they don't need to. Others eat because they're stressed out or bored. While you never want to get to the point of being ravenous (that's when bingeing is likely to happen), a hunger pang, a craving, or the fact that it's 3:00 p.m. should not send you racing  for the vending machine or obsessing about the energy bar in your purse. Ideally, you should put off eating until your stomach is growling and it's difficult to concentrate.  Use it to lose it. When you feel the urge to eat, use the HALT method. Ask yourself, Am I really hungry? Or am I angry or anxious, lonely or bored, or tired? If you're still not certain, try the apple test. If you're truly hungry, an apple should seem delicious; if it doesn't, something else is going on. Or you can try drinking water and making yourself busy, if you are still hungry try a healthy snack.  4. Not All Calories Are Created Equal The mechanics of weight loss are pretty simple: Take in fewer calories than you use for energy. But the kind of food you eat makes all the difference. Processed food that's high in saturated fat and refined starch or sugar can cause inflammation that disrupts the hormone signals that tell your brain you're full. The result: You eat a lot more.  Use it to lose it. Clean up your diet. Swap in whole, unprocessed foods, including vegetables, lean protein, and healthy fats that will fill you up and give you the biggest nutritional bang for your calorie buck. In a few weeks, as your brain starts receiving regular hunger and fullness signals once again, you'll notice that you feel less hungry overall and naturally start cutting back on the amount you eat.  5. Protein, Produce, and Plant-Based Fats Are Your Weight-Loss Trinity Here's why eating the three Ps regularly will help you drop pounds. Protein fills you up. You need it to build lean muscle, which keeps your metabolism humming so that you can torch more fat. People in a weight-loss program who ate double the recommended daily allowance for protein (about 110 grams for a 150-pound woman) lost 70 percent of their weight from fat, while people who ate the RDA lost only about 40 percent, one study found. Produce is packed with filling fiber. "It's very  difficult to consume too many calories if you're eating a lot of vegetables. Example: Three cups of broccoli is a lot of food, yet only 93 calories. (Fruit is another story. It can be easy to overeat and can contain a lot of calories from sugar, so be sure to monitor your intake.) Plant-based fats like olive oil and those in avocados and nuts are healthy and extra satiating.  Use it to lose it. Aim to incorporate each of the three Ps into every meal and snack. People who eat protein throughout the day are able to keep weight off, according to a study in the [employer]. In addition to meat, poultry and seafood, good sources are beans, lentils, eggs, tofu, and yogurt. As for fat, keep portion sizes in check by measuring out salad dressing, oil, and nut butters (shoot for one to two tablespoons). Finally, eat veggies or a little fruit at every meal. People who did that consumed 308 fewer calories but didn't feel any hungrier than when they didn't eat more produce.  7. How You Eat Is As Important As What You Eat In order for your brain to register that you're full, you  need to focus on what you're eating. Sit down whenever you eat, preferably at a table. Turn off the TV or computer, put down your phone, and look at your food. Smell it. Chew slowly, and don't put another bite on your fork until you swallow. When women ate lunch this attentively, they consumed 30 percent less when snacking later than those who listened to an audiobook at lunchtime, according to a study in the [place] [employer]. 8. Weighing Yourself Really Works The scale provides the best evidence about whether your efforts are paying off. Seeing the numbers tick up or down or stagnate is motivation to keep going-or to rethink your approach. A 2015 study at [facility] found that daily weigh-ins helped people lose more weight, keep it off, and maintain that loss, even after two years. Use it to lose  it. Step on the scale at the same time every day for the best results. If your weight shoots up several pounds from one weigh-in to the next, don't freak out. Eating a lot of salt the night before or having your period is the likely culprit. The number should return to normal in a day or two. It's a steady climb that you need to do something about. 9. Too Much Stress and Too Little Sleep Are Your Enemies When you're tired and frazzled, your body cranks up the production of cortisol, the stress hormone that can cause carb cravings. Not getting enough sleep also boosts your levels of ghrelin, a hormone associated with hunger, while suppressing leptin, a hormone that signals fullness and satiety. People on a diet who slept only five and a half hours a night for two weeks lost 55 percent less fat and were hungrier than those who slept eight and a half hours, according to a study in the [facility]. Use it to lose it. Prioritize sleep, aiming for seven hours or more a night, which research shows helps lower stress. And make sure you're getting quality zzz's. If a snoring spouse or a fidgety cat wakes you up frequently throughout the night, you may end up getting the equivalent of just four hours of sleep, according to a study from [facility]. Keep pets out of the bedroom, and use a white-noise app to drown out snoring. 10. You Will Hit a plateau-And You Can Bust Through It As you slim down, your body releases much less leptin, the fullness hormone.  If you're not strength training, start right now. Building muscle can raise your metabolism to help you overcome a plateau. To keep your body challenged and burning calories, incorporate new moves and more intense intervals into your workouts or add another sweat session to your weekly routine. Alternatively, cut an extra 100 calories or so a day from your diet. Now that you've lost weight, your body simply doesn't need as much fuel.     Since food equals calories, in order to lose weight you must either eat fewer calories, exercise more to burn off calories with activity, or both. Food that is not used to fuel the body is stored as fat. A major component of losing weight is to make smarter food choices. Here's how:  1)   Limit non-nutritious foods, such as: Sugar, honey, syrups and candy Pastries, donuts, pies, cakes and cookies Soft drinks, sweetened juices and alcoholic beverages  2)  Cut down on high-fat foods by: - Choosing poultry, fish or lean red meat - Choosing low-fat cooking methods, such as  baking, broiling, steaming, grilling and boiling - Using low-fat or non-fat dairy products - Using vinaigrette, herbs, lemon or fat-free salad dressings - Avoiding fatty meats, such as bacon, sausage, franks, ribs and luncheon meats - Avoiding high-fat snacks like nuts, chips and chocolate - Avoiding fried foods - Using less butter, margarine, oil and mayonnaise - Avoiding high-fat gravies, cream sauces and cream-based soups  3) Eat a variety of foods, including: - Fruit and vegetables that are raw, steamed or baked - Whole grains, breads, cereal, rice and pasta - Dairy products, such as low-fat or non-fat milk or yogurt, low-fat cottage cheese and low-fat cheese - Protein-rich foods like chicken, [place], fish, lean meat and legumes, or beans  4) Change your eating habits by: - Eat three balanced meals a day to help control your hunger - Watch portion sizes and eat small servings of a variety of foods - Choose low-calorie snacks - Eat only when you are hungry and stop when you are satisfied - Eat slowly and try not to perform other tasks while eating - Find other activities to distract you from food, such as walking, taking up a hobby or being involved in the community - Include regular exercise in your daily routine ( minimum of 20 min of moderate-intensity exercise at least 5 days/week)  - Find a support  group, if necessary, for emotional support in your weight loss journey           Easy ways to cut 100 calories   1. Eat your eggs with hot sauce OR salsa instead of cheese.  Eggs are great for breakfast, but many people consider eggs and cheese to be BFFs. Instead of cheese-1 oz. of cheddar has 114 calories-top your eggs with hot sauce, which contains no calories and helps with satiety and metabolism. Salsa is also a great option!!  2. Top your toast, waffles or pancakes with fresh berries instead of jelly or syrup. Half a cup of berries-fresh, frozen or thawed-has about 40 calories, compared with 2 tbsp. of maple syrup or jelly, which both have about 100 calories. The berries will also give you a good punch of fiber, which helps keep you full and satisfied and won't spike blood sugar quickly like the jelly or syrup. 3. Swap the non-fat latte for black coffee with a splash of half-and-half. Contrary to its name, that non-fat latte has 130 calories and a startling 19g of carbohydrates per 16 oz. serving. Replacing that 'light' drinkable dessert with a black coffee with a splash of half-and-half saves you more than 100 calories per 16 oz. serving. 4. Sprinkle salads with freeze-dried raspberries instead of dried cranberries. If you want a sweet addition to your nutritious salad, stay away from dried cranberries. They have a whopping 130 calories per  cup and 30g carbohydrates. Instead, sprinkle freeze-dried raspberries guilt-free and save more than 100 calories per  cup serving, adding 3g of belly-filling fiber. 5. Go for mustard in place of mayo on your sandwich. Mustard can add really nice flavor to any sandwich, and there are tons of varieties, from spicy to honey. A serving of mayo is 95 calories, versus 10 calories in a serving of mustard.  Or try an avocado mayo spread: You can find the recipe few click this  link: https://www.californiaavocado.com/recipes/recipe-container/[place]-avocado-mayo 6. Choose a DIY salad dressing instead of the store-bought kind. Mix Dijon or whole grain mustard with low-fat Kefir or red wine vinegar and garlic. 7. Use hummus as a spread instead of a  dip. Use hummus as a spread on a high-fiber cracker or tortilla with a sandwich and save on calories without sacrificing taste. 8. Pick just one salad "accessory." Salad isn't automatically a calorie winner. It's easy to over-accessorize with toppings. Instead of topping your salad with nuts, avocado and cranberries (all three will clock in at 313 calories), just pick one. The next day, choose a different accessory, which will also keep your salad interesting. You don't wear all your jewelry every day, right? 9. Ditch the white pasta in favor of spaghetti squash. One cup of cooked spaghetti squash has about 40 calories, compared with traditional spaghetti, which comes with more than 200. Spaghetti squash is also nutrient-dense. It's a good source of fiber and Vitamins A and C, and it can be eaten just like you would eat pasta-with a great tomato sauce and [place] meatballs or with pesto, tofu and spinach, for example. 10. Dress up your chili, soups and stews with non-fat [place] yogurt instead of sour cream. Just a 'dollop' of sour cream can set you back 115 calories and a whopping 12g of fat-seven of which are of the artery-clogging variety. Added bonus: [place] yogurt is packed with muscle-building protein, calcium and B Vitamins. 11. Mash cauliflower instead of mashed potatoes. One cup of traditional mashed potatoes-in all their creamy goodness-has more than 200 calories, compared to mashed cauliflower, which you can typically eat for less than 100 calories per 1 cup serving. Cauliflower is a great source of the antioxidant indole-3-carbinol (I3C), which may help reduce the risk of some cancers, like breast cancer. 12. Ditch the ice  cream sundae in favor of a [place] yogurt parfait. Instead of a cup of ice cream or fro-yo for dessert, try 1 cup of nonfat Greek yogurt topped with fresh berries and a sprinkle of cacao nibs. Both toppings are packed with antioxidants, which can help reduce cellular inflammation and oxidative damage. And the comparison is a no-brainer: One cup of ice cream has about 275 calories; one cup of frozen yogurt has about 230; and a cup of Greek yogurt has just 130, plus twice the protein, so you're less likely to return to the freezer for a second helping. 13. Put olive oil in a spray container instead of using it directly from the bottle. Each tablespoon of olive oil is 120 calories and 15g of fat. Use a mister instead of pouring it straight into the pan or onto a salad. This allows for portion control and will save you more than 100 calories. 14. When baking, substitute canned pumpkin for butter or oil. Canned pumpkin-not pumpkin pie mix-is loaded with Vitamin A, which is important for skin and eye health, as well as immunity. And the comparisons are pretty crazy:  cup of canned pumpkin has about 40 calories, compared to butter or oil, which has more than 800 calories. Yes, 800 calories. Applesauce and mashed banana can also serve as good substitutions for butter or oil, usually in a 1:1 ratio. 15. Top casseroles with high-fiber cereal instead of breadcrumbs. Breadcrumbs are typically made with white bread, while breakfast cereals contain 5-9g of fiber per serving. Not only will you save more than 150 calories per  cup serving, the swap will also keep you more full and you'll get a metabolism boost from the added fiber. 16. Snack on pistachios instead of macadamia nuts. Believe it or not, you get the same amount of calories from 35 pistachios (100 calories) as you would from only five macadamia nuts.  17. Chow down on kale chips rather than potato chips. This is my favorite 'don't knock it 'till you try it'  swap. Kale chips are so easy to make at home, and you can spice them up with a little grated parmesan or chili powder. Plus, they're a mere fraction of the calories of potato chips, but with the same crunch factor we crave so often. 18. Add seltzer and some fruit slices to your cocktail instead of soda or fruit juice. One cup of soda or fruit juice can pack on as much as 140 calories. Instead, use seltzer and fruit slices. The fruit provides valuable phytochemicals, such as flavonoids and anthocyanins, which help to combat cancer and stave off the aging process.

## 2017-07-02 ENCOUNTER — Other Ambulatory Visit: Payer: 59

## 2017-07-02 DIAGNOSIS — R5383 Other fatigue: Secondary | ICD-10-CM

## 2017-07-02 DIAGNOSIS — Z833 Family history of diabetes mellitus: Secondary | ICD-10-CM | POA: Diagnosis not present

## 2017-07-02 DIAGNOSIS — E781 Pure hyperglyceridemia: Secondary | ICD-10-CM

## 2017-07-02 DIAGNOSIS — Z0000 Encounter for general adult medical examination without abnormal findings: Secondary | ICD-10-CM | POA: Diagnosis not present

## 2017-07-03 LAB — COMPREHENSIVE METABOLIC PANEL
ALK PHOS: 51 IU/L (ref 39–117)
ALT: 28 IU/L (ref 0–32)
AST: 25 IU/L (ref 0–40)
Albumin/Globulin Ratio: 2 (ref 1.2–2.2)
Albumin: 4.7 g/dL (ref 3.5–5.5)
BUN/Creatinine Ratio: 11 (ref 9–23)
BUN: 9 mg/dL (ref 6–20)
Bilirubin Total: 0.5 mg/dL (ref 0.0–1.2)
CO2: 23 mmol/L (ref 20–29)
CREATININE: 0.85 mg/dL (ref 0.57–1.00)
Calcium: 9.5 mg/dL (ref 8.7–10.2)
Chloride: 103 mmol/L (ref 96–106)
GFR calc Af Amer: 108 mL/min/{1.73_m2} (ref >59)
GFR calc non Af Amer: 94 mL/min/{1.73_m2} (ref >59)
GLUCOSE: 97 mg/dL (ref 65–99)
Globulin, Total: 2.4 g/dL (ref 1.5–4.5)
Potassium: 4.3 mmol/L (ref 3.5–5.2)
SODIUM: 142 mmol/L (ref 134–144)
Total Protein: 7.1 g/dL (ref 6.0–8.5)

## 2017-07-03 LAB — CBC WITH DIFFERENTIAL/PLATELET
BASOS ABS: 0 10*3/uL (ref 0.0–0.2)
Basos: 1 %
EOS (ABSOLUTE): 0.2 10*3/uL (ref 0.0–0.4)
EOS: 3 %
Hematocrit: 46.2 % (ref 34.0–46.6)
Hemoglobin: 16 g/dL — ABNORMAL HIGH (ref 11.1–15.9)
Immature Grans (Abs): 0 10*3/uL (ref 0.0–0.1)
Immature Granulocytes: 0 %
LYMPHS ABS: 2.5 10*3/uL (ref 0.7–3.1)
Lymphs: 44 %
MCH: 32.9 pg (ref 26.6–33.0)
MCHC: 34.6 g/dL (ref 31.5–35.7)
MCV: 95 fL (ref 79–97)
MONOCYTES: 8 %
MONOS ABS: 0.4 10*3/uL (ref 0.1–0.9)
NEUTROS PCT: 44 %
Neutrophils Absolute: 2.4 10*3/uL (ref 1.4–7.0)
PLATELETS: 247 10*3/uL (ref 150–450)
RBC: 4.86 x10E6/uL (ref 3.77–5.28)
RDW: 12.5 % (ref 12.3–15.4)
WBC: 5.5 10*3/uL (ref 3.4–10.8)

## 2017-07-03 LAB — HEMOGLOBIN A1C
ESTIMATED AVERAGE GLUCOSE: 100 mg/dL
HEMOGLOBIN A1C: 5.1 % (ref 4.8–5.6)

## 2017-07-03 LAB — LIPID PANEL
Chol/HDL Ratio: 3.6 ratio (ref 0.0–4.4)
Cholesterol, Total: 158 mg/dL (ref 100–199)
HDL: 44 mg/dL (ref >39)
LDL Calculated: 100 mg/dL — ABNORMAL HIGH (ref 0–99)
TRIGLYCERIDES: 72 mg/dL (ref 0–149)
VLDL Cholesterol Cal: 14 mg/dL (ref 5–40)

## 2017-07-03 LAB — TSH: TSH: 5.75 u[IU]/mL — ABNORMAL HIGH (ref 0.450–4.500)

## 2017-07-03 LAB — T4, FREE: FREE T4: 1.09 ng/dL (ref 0.82–1.77)

## 2017-07-03 LAB — LDL CHOLESTEROL, DIRECT: LDL Direct: 103 mg/dL — ABNORMAL HIGH (ref 0–99)

## 2017-07-03 LAB — VITAMIN D 25 HYDROXY (VIT D DEFICIENCY, FRACTURES): VIT D 25 HYDROXY: 23.6 ng/mL — AB (ref 30.0–100.0)

## 2017-07-17 MED FILL — RIZATRIPTAN BENZOATE 10 MG: 10 | 30 days supply | Qty: 10 | Fill #0

## 2017-07-21 ENCOUNTER — Inpatient Hospital Stay (HOSPITAL_COMMUNITY)
Admission: AD | Admit: 2017-07-21 | Discharge: 2017-07-21 | Disposition: A | Discharge status: Home / Self Care | Payer: 59 | Source: Ambulatory Visit | Attending: Obstetrics and Gynecology | Admitting: Obstetrics and Gynecology

## 2017-07-21 ENCOUNTER — Encounter (HOSPITAL_COMMUNITY): Payer: Self-pay | Admitting: *Deleted

## 2017-07-21 DIAGNOSIS — Z881 Allergy status to other antibiotic agents status: Secondary | ICD-10-CM | POA: Insufficient documentation

## 2017-07-21 DIAGNOSIS — R102 Pelvic and perineal pain: Secondary | ICD-10-CM | POA: Diagnosis not present

## 2017-07-21 DIAGNOSIS — Z87891 Personal history of nicotine dependence: Secondary | ICD-10-CM | POA: Diagnosis not present

## 2017-07-21 DIAGNOSIS — Z975 Presence of (intrauterine) contraceptive device: Secondary | ICD-10-CM | POA: Insufficient documentation

## 2017-07-21 LAB — URINALYSIS, ROUTINE W REFLEX MICROSCOPIC
Bilirubin Urine: NEGATIVE
GLUCOSE, UA: NEGATIVE mg/dL
Hgb urine dipstick: NEGATIVE
Ketones, ur: NEGATIVE mg/dL
Leukocytes, UA: NEGATIVE
Nitrite: NEGATIVE
PH: 7 (ref 5.0–8.0)
Protein, ur: NEGATIVE mg/dL
Specific Gravity, Urine: 1.005 (ref 1.005–1.030)

## 2017-07-21 LAB — POCT PREGNANCY, URINE: Preg Test, Ur: NEGATIVE

## 2017-07-21 MED ORDER — KETOROLAC TROMETHAMINE 60 MG/2ML IM SOLN
60.0000 mg | INTRAMUSCULAR | Status: AC
  Administered 2017-07-21: 60 mg via INTRAMUSCULAR
  Filled 2017-07-21: qty 2

## 2017-07-21 NOTE — Discharge Instructions (Signed)
Your [place] IUD was seen by speculum exam. If your pelvic pain is not resolved with taking Ibuprofen (Motrin or Naproxen) every 6 hours, call to schedule an appointment to be seen.  In February 2020, the [facility] will be moving to the [facility] At that time, the MAU (Maternity Admissions Unit), where you are being seen today, will no longer take care of non-pregnant patients. We strongly encourage you to find a doctor's office before that time, so that you can be seen with any GYN concerns, like vaginal discharge, urinary tract infection, etc.. in a timely manner.  In order to make an office visit more convenient, the [facility] [place] [facility] will be offering evening hours with same-day appointments, walk-in appointments and scheduled appointments available during this time.  Center for [facility] @ [facility] Hours: Monday - 8am - 7:30 pm with walk-in between 4pm- 7:30 pm Tuesday - 8 am - 7:30pm with walk-in between 4 pm - 7:30 pm Wednesday - 8 am - 5 pm  Thursday 8 am - 5 pm  Friday 8 am - 5 pm  For an appointment please call the Center for [place] @ [facility] at [phone]  For urgent needs, [facility] is also available for management of urgent GYN complaints such as vaginal discharge or urinary tract infections.

## 2017-07-21 NOTE — MAU Provider Note (Signed)
History     CSN: [id number]  Arrival date and time: 07/21/17 2046   First Provider Initiated Contact with Patient 07/21/17 2149      Chief Complaint  Patient presents with  . Abdominal Pain   HPI  Ms.  Dana Gonzalez is a 27 y.o. year old [id number] female at Unknown weeks gestation who presents to MAU reporting she thought she saw some of her IUD come out on her tissue Saturday night (7/13). She had a Liletta IUD placed at her postpartum visit in 2016 at [facility]. She reports she started having "lots of vaginal d/c Saturday morning (7/13) and then abdominal pain just before "seeing pieces of the IUD on tissue paper" that same night. She still has abdominal pain tonight that she rates 3/10. She states that she has "never been able to" feel her IUD strings since it was inserted 3 yrs ago. She denies any VB, fever, N/V/D.  Past Medical History:  Diagnosis Date  . Family history of breast cancer   . GERD (gastroesophageal reflux disease)   . Headache(784.0)   . Infection    UTI  . Thyroid disease     Past Surgical History:  Procedure Laterality Date  . MOUTH SURGERY      Family History  Problem Relation Age of Onset  . Cancer Mother 15       breast to bone  . Hypertension Father   . Diabetes Maternal Grandmother   . Stroke Maternal Grandmother   . Heart disease Paternal Grandmother   . Breast cancer Maternal Aunt 28  . Mental retardation Maternal Aunt   . Breast cancer Maternal Aunt 40       dx twice, another time in her 23s-50s    Social History   Tobacco Use  . Smoking status: Former Smoker    Packs/day: 0.25    Years: 1.00    Pack years: 0.25    Types: Cigarettes    Last attempt to quit: 05/22/2012    Years since quitting: 5.1  . Smokeless tobacco: Never Used  . Tobacco comment: Aug 2015  Substance Use Topics  . Alcohol use: No    Comment: No THC use since age 43  . Drug use: No    Types: Marijuana    Comment: not since age 58    Allergies:  Allergies   Allergen Reactions  . Influenza Vaccines Hives  . Keflex [Cephalexin]   . Latex Rash    Medications Prior to Admission  Medication Sig Dispense Refill Last Dose  . levonorgestrel (MIRENA) 20 MCG/24HR IUD 1 each by Intrauterine route once.   Taking  . Multiple Vitamin (MULTIVITAMIN) capsule Take 1 capsule by mouth daily.   Taking  . rizatriptan (MAXALT) 10 MG tablet May repeat in 2 hours if needed 10 tablet 0     Review of Systems  Constitutional: Negative.   HENT: Negative.   Eyes: Negative.   Respiratory: Negative.   Cardiovascular: Negative.   Gastrointestinal: Positive for abdominal pain.  Endocrine: Negative.   Genitourinary: Positive for pelvic pain and vaginal discharge.  Musculoskeletal: Negative.   Skin: Negative.   Allergic/Immunologic: Negative.   Neurological: Negative.   Hematological: Negative.   Psychiatric/Behavioral: Negative.    Physical Exam   Blood pressure 121/67, pulse 76, temperature 98.5 F (36.9 C), temperature source Oral, resp. rate 18, height 5\' 7"  (1.702 m), weight 84.8 kg (187 lb).  Physical Exam  Nursing note and vitals reviewed. Constitutional: She is oriented to person, place,  and time. She appears well-developed and well-nourished.  HENT:  Head: Normocephalic and atraumatic.  Eyes: Pupils are equal, round, and reactive to light.  Neck: Thyromegaly present.  Cardiovascular: Normal rate.  Respiratory: Effort normal.  GI: Soft.  Genitourinary:  Genitourinary Comments: Uterus: non-tender, SE: cervix is smooth, pink, no lesions, moderate amt of thick, mucoid, white vaginal d/c -- no testing done, closed/long/firm, IUD strings visualized at 7 o'clock position appearing normal length  Musculoskeletal: Normal range of motion.  Neurological: She is alert and oriented to person, place, and time.  Skin: Skin is warm and dry.  Psychiatric: She has a normal mood and affect. Her behavior is normal. Judgment and thought content normal.    MAU  Course  Procedures  MDM [facility] UPT Speculum Exam Toradol 60 mg IM injection -- no improvement in pain after 20 mins  Results for orders placed or performed during the hospital encounter of 07/21/17 (from the past 24 hour(s))  Urinalysis, Routine w reflex microscopic     Status: Abnormal   Collection Time: 07/21/17  9:47 PM  Result Value Ref Range   Color, Urine STRAW (A) YELLOW   APPearance CLEAR CLEAR   Specific Gravity, Urine 1.005 1.005 - 1.030   pH 7.0 5.0 - 8.0   Glucose, UA NEGATIVE NEGATIVE mg/dL   Hgb urine dipstick NEGATIVE NEGATIVE   Bilirubin Urine NEGATIVE NEGATIVE   Ketones, ur NEGATIVE NEGATIVE mg/dL   Protein, ur NEGATIVE NEGATIVE mg/dL   Nitrite NEGATIVE NEGATIVE   Leukocytes, UA NEGATIVE NEGATIVE  Pregnancy, urine POC     Status: None   Collection Time: 07/21/17  9:48 PM  Result Value Ref Range   Preg Test, Ur NEGATIVE NEGATIVE     Assessment and Plan  Pelvic pain  - Advised to take Ibuprofen 600 mg every 6 hrs prn pain - Advised to seek F/U with [place], if pelvic pain persists  IUD (intrauterine device) in place  - Reassurance given to IUD strings were seen on exam - Advised to try to check for strings at least once a month (guidance given on where strings were seen)  - Discharge patient - Patient verbalized an understanding of the plan of care and agrees.     Laury Deep, MSN, CNM 07/21/2017, 9:49 PM

## 2017-07-21 NOTE — MAU Note (Signed)
PT SAYS HAD IUD INSERTED 3 YEARS AGO- - IN CLINIC.   HAS NEVER FELT STRING. THINKS SHE SAW SOME OF IUD ON TOILET PAPER- ON SAT.   CRAMPING STARTED   SAT NIGHT.Marland Kitchen  SAT  MORN - HAD LOT OF D/C .

## 2017-08-05 ENCOUNTER — Ambulatory Visit (INDEPENDENT_AMBULATORY_CARE_PROVIDER_SITE_OTHER): Payer: 59 | Admitting: Family Medicine

## 2017-08-05 ENCOUNTER — Encounter: Payer: Self-pay | Admitting: Family Medicine

## 2017-08-05 VITALS — BP 117/81 | HR 65 | Ht 67.0 in | Wt 182.0 lb

## 2017-08-05 DIAGNOSIS — R7989 Other specified abnormal findings of blood chemistry: Secondary | ICD-10-CM

## 2017-08-05 DIAGNOSIS — E7800 Pure hypercholesterolemia, unspecified: Secondary | ICD-10-CM

## 2017-08-05 DIAGNOSIS — E559 Vitamin D deficiency, unspecified: Secondary | ICD-10-CM | POA: Diagnosis not present

## 2017-08-05 DIAGNOSIS — R5383 Other fatigue: Secondary | ICD-10-CM

## 2017-08-05 DIAGNOSIS — L709 Acne, unspecified: Secondary | ICD-10-CM

## 2017-08-05 MED ORDER — VITAMIN D (ERGOCALCIFEROL) 1.25 MG (50000 UNIT) PO CAPS: ORAL_CAPSULE | ORAL | 3 refills | Status: DC

## 2017-08-05 MED ORDER — VITAMIN D (ERGOCALCIFEROL) 1.25 MG (50000 UNIT) PO CAPS
ORAL_CAPSULE | ORAL | 3 refills | Status: AC
Start: 2017-08-05 — End: ?

## 2017-08-05 MED FILL — VIT D2 1.25 MG (50,000 UNIT: 1.25 MG | 84 days supply | Qty: 24 | Fill #0

## 2017-08-05 NOTE — Patient Instructions (Addendum)
Behavior Modification Ideas for Weight Management  Weight management involves adopting a healthy lifestyle that includes a knowledge of nutrition and exercise, a positive attitude and the right kind of motivation. Internal motives such as better health, increased energy, self-esteem and personal control increase your chances of lifelong weight management success.  Remember to have realistic goals and think long-term success. Believe in yourself and you can do it. The following information will give you ideas to help you meet your goals.  Control Your Home Environment  Eat only while sitting down at the kitchen or dining room table. Do not eat while watching television, reading, cooking, talking on the phone, standing at the refrigerator or working on the computer. Keep tempting foods out of the house -- don't buy them. Keep tempting foods out of sight. Have low-calorie foods ready to eat. Unless you are preparing a meal, stay out of the kitchen. Have healthy snacks at your disposal, such as small pieces of fruit, vegetables, canned fruit, pretzels, low-fat string cheese and nonfat cottage cheese.  Control Your Work Environment  Do not eat at [employer] or keep tempting snacks at your desk. If you get hungry between meals, plan healthy snacks and bring them with you to work. During your breaks, go for a walk instead of eating. If you work around food, plan in advance the one item you will eat at mealtime. Make it inconvenient to nibble on food by chewing gum, sugarless candy or drinking water or another low-calorie beverage. Do not work through meals. Skipping meals slows down metabolism and may result in overeating at the next meal. If food is available for special occasions, either pick the healthiest item, nibble on low-fat snacks brought from home, don't have anything offered, choose one option and have a small amount, or have only a beverage.  Control Your Mealtime Environment  Serve  your plate of food at the stove or kitchen counter. Do not put the serving dishes on the table. If you do put dishes on the table, remove them immediately when finished eating. Fill half of your plate with vegetables, a quarter with lean protein and a quarter with starch. Use smaller plates, bowls and glasses. A smaller portion will look large when it is in a little dish. Politely refuse second helpings. When fixing your plate, limit portions of food to one scoop/serving or less.   Daily Food Management  Replace eating with another activity that you will not associate with food. Wait 20 minutes before eating something you are craving. Drink a large glass of water or diet soda before eating. Always have a big glass or bottle of water to drink throughout the day. Avoid high-calorie add-ons such as cream with your coffee, butter, mayonnaise and salad dressings.  Shopping: Do not shop when hungry or tired. Shop from a list and avoid buying anything that is not on your list. If you must have tempting foods, buy individual-sized packages and try to find a lower-calorie alternative. Don't taste test in the store. Read food labels. Compare products to help you make the healthiest choices.  Preparation: Chew a piece of gum while cooking meals. Use a quarter teaspoon if you taste test your food. Try to only fix what you are going to eat, leaving yourself no chance for seconds. If you have prepared more food than you need, portion it into individual containers and freeze or refrigerate immediately. Don't snack while cooking meals.  Eating: Eat slowly. Remember it takes about 20 minutes  for your stomach to send a message to your brain that it is full. Don't let fake hunger make you think you need more. The ideal way to eat is to take a bite, put your utensil down, take a sip of water, cut your next bite, take a bit, put your utensil down and so on. Do not cut your food all at one time. Cut only  as needed. Take small bites and chew your food well. Stop eating for a minute or two at least once during a meal or snack. Take breaks to reflect and have conversation.  Cleanup and Leftovers: Label leftovers for a specific meal or snack. Freeze or refrigerate individual portions of leftovers. Do not clean up if you are still hungry.  Eating Out and Social Eating  Do not arrive hungry. Eat something light before the meal. Try to fill up on low-calorie foods, such as vegetables and fruit, and eat smaller portions of the high-calorie foods. Eat foods that you like, but choose small portions. If you want seconds, wait at least 20 minutes after you have eaten to see if you are actually hungry or if your eyes are bigger than your stomach. Limit alcoholic beverages. Try a soda water with a twist of lime. Do not skip other meals in the day to save room for the special event.  At Restaurants: Order  la carte rather than buffet style. Order some vegetables or a salad for an appetizer instead of eating bread. If you order a high-calorie dish, share it with someone. Try an after-dinner mint with your coffee. If you do have dessert, share it with two or more people. Don't overeat because you do not want to waste food. Ask for a doggie bag to take extra food home. Tell the server to put half of your entree in a to go bag before the meal is served to you. Ask for salad dressing, gravy or high-fat sauces on the side. Dip the tip of your fork in the dressing before each bite. If bread is served, ask for only one piece. Try it plain without butter or oil. At [employer] where oil and vinegar is served with bread, use only a small amount of oil and a lot of vinegar for dipping.  At a Friend[facility] House: Offer to bring a dish, appetizer or dessert that is low in calories. Serve yourself small portions or tell the host that you only want a small amount. Stand or sit away from the snack table. Stay  away from the kitchen or stay busy if you are near the food. Limit your alcohol intake.  At [employer] and Cafeterias: Cover most of your plate with lettuce and/or vegetables. Use a salad plate instead of a dinner plate. After eating, clear away your dishes before having coffee or tea.  Entertaining at Home: Explore low-fat, low-cholesterol cookbooks. Use single-serving foods like chicken breasts or hamburger patties. Prepare low-calorie appetizers and desserts.   Holidays: Keep tempting foods out of sight. Decorate the house without using food. Have low-calorie beverages and foods on hand for guests. Allow yourself one planned treat a day. Don't skip meals to save up for the holiday feast. Eat regular, planned meals.   Exercise Well  Make exercise a priority and a planned activity in the day. If possible, walk the entire or part of the distance to work. Get an exercise buddy. Go for a walk with a colleague during one of your breaks, go to the gym, run or  take a walk with a friend, walk in the mall with a shopping companion. Park at the end of the parking lot and walk to the store or office entrance. Always take the stairs all of the way or at least part of the way to your floor. If you have a desk job, walk around the office frequently. Do leg lifts while sitting at your desk. Do something outside on the weekends like going for a hike or a bike ride.   Have a Healthy Attitude  Make health your weight management priority. Be realistic. Have a goal to achieve a healthier you, not necessarily the lowest weight or ideal weight based on calculations or tables. Focus on a healthy eating style, not on dieting. Dieting usually lasts for a short amount of time and rarely produces long-term success. Think long term. You are developing new healthy behaviors to follow next month, in a year and in a decade.    This information is for educational purposes only and is not intended to replace  the advice of your doctor or health care provider. We encourage you to discuss with your doctor any questions or concerns you may have.        Guidelines for Losing Weight   We want weight loss that will last so you should lose 1-2 pounds a week.  THAT IS IT! Please pick THREE things a month to change. Once it is a habit check off the item. Then pick another three items off the list to become habits.  If you are already doing a habit on the list GREAT!  Cross that item off!  Dont drink your calories. Ie, alcohol, soda, fruit juice, and sweet tea.   Drink more water. Drink a glass when you feel hungry or before each meal.   Eat breakfast - Complex carb and protein (likeDannon light and fit yogurt, oatmeal, fruit, eggs, [place] bacon).  Measure your cereal.  Eat no more than one cup a day. (ie Kashi)  Eat an apple a day.  Add a vegetable a day.  Try a new vegetable a month.  Use Pam! Stop using oil or butter to cook.  Dont finish your plate or use smaller plates.  Share your dessert.  Eat sugar free Jello for dessert or frozen grapes.  Dont eat 2-3 hours before bed.  Switch to whole wheat bread, pasta, and brown rice.  Make healthier choices when you eat out. No fries!  Pick baked chicken, NOT fried.  Dont forget to SLOW DOWN when you eat. It is not going anywhere.   Take the stairs.  Park far away in the parking lot  Lift soup cans (or weights) for 10 minutes while watching TV.  Walk at work for 10 minutes during break.  Walk outside 1 time a week with your friend, kids, dog, or significant other.  Start a walking group at church.  Walk the mall as much as you can tolerate.   Keep a food diary.  Weigh yourself daily.  Walk for 15 minutes 3 days per week.  Cook at home more often and eat out less. If life happens and you go back to old habits, it is okay.  Just start over. You can do it!  If you experience chest pain, get short of breath, or tired  during the exercise, please stop immediately and inform your doctor.    Before you even begin to attack a weight-loss plan, it pays to remember this: You are not  fat. You have fat. Losing weight isn't about blame or shame; it's simply another achievement to accomplish. Dieting is like any other skill--you have to buckle down and work at it. As long as you act in a smart, reasonable way, you'll ultimately get where you want to be. Here are some weight loss pearls for you.   1. It's Not a Diet. It's a Lifestyle Thinking of a diet as something you're on and suffering through only for the short term doesn't work. To shed weight and keep it off, you need to make permanent changes to the way you eat. It's OK to indulge occasionally, of course, but if you cut calories temporarily and then revert to your old way of eating, you'll gain back the weight quicker than you can say yo-yo. Use it to lose it. Research shows that one of the best predictors of long-term weight loss is how many pounds you drop in the first month. For that reason, nutritionists often suggest being stricter for the first two weeks of your new eating strategy to build momentum. Cut out added sugar and alcohol and avoid unrefined carbs. After that, figure out how you can reincorporate them in a way that's healthy and maintainable.  2. There's a Right Way to Exercise Working out burns calories and fat and boosts your metabolism by building muscle. But those trying to lose weight are notorious for overestimating the number of calories they burn and underestimating the amount they take in. Unfortunately, your system is biologically programmed to hold on to extra pounds and that means when you start exercising, your body senses the deficit and ramps up its hunger signals. If you're not diligent, you'll eat everything you burn and then some. Use it, to lose it. Cardio gets all the exercise glory, but strength and interval training are the real  heroes. They help you build lean muscle, which in turn increases your metabolism and calorie-burning ability 3. Don't Overreact to Mild Hunger Some people have a hard time losing weight because of hunger anxiety. To them, being hungry is bad--something to be avoided at all costs--so they carry snacks with them and eat when they don't need to. Others eat because they're stressed out or bored. While you never want to get to the point of being ravenous (that's when bingeing is likely to happen), a hunger pang, a craving, or the fact that it's 3:00 p.m. should not send you racing for the vending machine or obsessing about the energy bar in your purse. Ideally, you should put off eating until your stomach is growling and it's difficult to concentrate.  Use it to lose it. When you feel the urge to eat, use the HALT method. Ask yourself, Am I really hungry? Or am I angry or anxious, lonely or bored, or tired? If you're still not certain, try the apple test. If you're truly hungry, an apple should seem delicious; if it doesn't, something else is going on. Or you can try drinking water and making yourself busy, if you are still hungry try a healthy snack.  4. Not All Calories Are Created Equal The mechanics of weight loss are pretty simple: Take in fewer calories than you use for energy. But the kind of food you eat makes all the difference. Processed food that's high in saturated fat and refined starch or sugar can cause inflammation that disrupts the hormone signals that tell your brain you're full. The result: You eat a lot more.  Use it to lose  it. Clean up your diet. Swap in whole, unprocessed foods, including vegetables, lean protein, and healthy fats that will fill you up and give you the biggest nutritional bang for your calorie buck. In a few weeks, as your brain starts receiving regular hunger and fullness signals once again, you'll notice that you feel less hungry overall and naturally start cutting back on  the amount you eat.  5. Protein, Produce, and Plant-Based Fats Are Your Weight-Loss Trinity Here's why eating the three Ps regularly will help you drop pounds. Protein fills you up. You need it to build lean muscle, which keeps your metabolism humming so that you can torch more fat. People in a weight-loss program who ate double the recommended daily allowance for protein (about 110 grams for a 150-pound woman) lost 70 percent of their weight from fat, while people who ate the RDA lost only about 40 percent, one study found. Produce is packed with filling fiber. "It's very difficult to consume too many calories if you're eating a lot of vegetables. Example: Three cups of broccoli is a lot of food, yet only 93 calories. (Fruit is another story. It can be easy to overeat and can contain a lot of calories from sugar, so be sure to monitor your intake.) Plant-based fats like olive oil and those in avocados and nuts are healthy and extra satiating.  Use it to lose it. Aim to incorporate each of the three Ps into every meal and snack. People who eat protein throughout the day are able to keep weight off, according to a study in the [place] of Clinical Nutrition. In addition to meat, poultry and seafood, good sources are beans, lentils, eggs, tofu, and yogurt. As for fat, keep portion sizes in check by measuring out salad dressing, oil, and nut butters (shoot for one to two tablespoons). Finally, eat veggies or a little fruit at every meal. People who did that consumed 308 fewer calories but didn't feel any hungrier than when they didn't eat more produce.  7. How You Eat Is As Important As What You Eat In order for your brain to register that you're full, you need to focus on what you're eating. Sit down whenever you eat, preferably at a table. Turn off the TV or computer, put down your phone, and look at your food. Smell it. Chew slowly, and don't put another bite on your fork until you swallow. When  women ate lunch this attentively, they consumed 30 percent less when snacking later than those who listened to an audiobook at lunchtime, according to a study in the [place] of Nutrition. 8. Weighing Yourself Really Works The scale provides the best evidence about whether your efforts are paying off. Seeing the numbers tick up or down or stagnate is motivation to keep going--or to rethink your approach. A 2015 study at [facility] found that daily weigh-ins helped people lose more weight, keep it off, and maintain that loss, even after two years. Use it to lose it. Step on the scale at the same time every day for the best results. If your weight shoots up several pounds from one weigh-in to the next, don't freak out. Eating a lot of salt the night before or having your period is the likely culprit. The number should return to normal in a day or two. It's a steady climb that you need to do something about. 9. Too Much Stress and Too Little Sleep Are Your Enemies When you're tired and frazzled,  your body cranks up the production of cortisol, the stress hormone that can cause carb cravings. Not getting enough sleep also boosts your levels of ghrelin, a hormone associated with hunger, while suppressing leptin, a hormone that signals fullness and satiety. People on a diet who slept only five and a half hours a night for two weeks lost 55 percent less fat and were hungrier than those who slept eight and a half hours, according to a study in the [place]. Use it to lose it. Prioritize sleep, aiming for seven hours or more a night, which research shows helps lower stress. And make sure you're getting quality zzz's. If a snoring spouse or a fidgety cat wakes you up frequently throughout the night, you may end up getting the equivalent of just four hours of sleep, according to a study from [facility]. Keep pets out of the bedroom, and use a white-noise app to drown out  snoring. 10. You Will Hit a plateau--And You Can Bust Through It As you slim down, your body releases much less leptin, the fullness hormone.  If you're not strength training, start right now. Building muscle can raise your metabolism to help you overcome a plateau. To keep your body challenged and burning calories, incorporate new moves and more intense intervals into your workouts or add another sweat session to your weekly routine. Alternatively, cut an extra 100 calories or so a day from your diet. Now that you've lost weight, your body simply doesn't need as much fuel.    Since food equals calories, in order to lose weight you must either eat fewer calories, exercise more to burn off calories with activity, or both. Food that is not used to fuel the body is stored as fat. A major component of losing weight is to make smarter food choices. Here's how:  1)   Limit non-nutritious foods, such as: Sugar, honey, syrups and candy Pastries, donuts, pies, cakes and cookies Soft drinks, sweetened juices and alcoholic beverages  2)  Cut down on high-fat foods by: - Choosing poultry, fish or lean red meat - Choosing low-fat cooking methods, such as baking, broiling, steaming, grilling and boiling - Using low-fat or non-fat dairy products - Using vinaigrette, herbs, lemon or fat-free salad dressings - Avoiding fatty meats, such as bacon, sausage, franks, ribs and luncheon meats - Avoiding high-fat snacks like nuts, chips and chocolate - Avoiding fried foods - Using less butter, margarine, oil and mayonnaise - Avoiding high-fat gravies, cream sauces and cream-based soups  3) Eat a variety of foods, including: - Fruit and vegetables that are raw, steamed or baked - Whole grains, breads, cereal, rice and pasta - Dairy products, such as low-fat or non-fat milk or yogurt, low-fat cottage cheese and low-fat cheese - Protein-rich foods like chicken, [place], fish, lean meat and legumes, or beans  4)  Change your eating habits by: - Eat three balanced meals a day to help control your hunger - Watch portion sizes and eat small servings of a variety of foods - Choose low-calorie snacks - Eat only when you are hungry and stop when you are satisfied - Eat slowly and try not to perform other tasks while eating - Find other activities to distract you from food, such as walking, taking up a hobby or being involved in the community - Include regular exercise in your daily routine ( minimum of 20 min of moderate-intensity exercise at least 5 days/week)  - Find a support group,  if necessary, for emotional support in your weight loss journey           Easy ways to cut 100 calories   1. Eat your eggs with hot sauce OR salsa instead of cheese.  Eggs are great for breakfast, but many people consider eggs and cheese to be BFFs. Instead of cheese--1 oz. of cheddar has 114 calories--top your eggs with hot sauce, which contains no calories and helps with satiety and metabolism. Salsa is also a great option!!  2. Top your toast, waffles or pancakes with fresh berries instead of jelly or syrup. Half a cup of berries--fresh, frozen or thawed--has about 40 calories, compared with 2 tbsp. of maple syrup or jelly, which both have about 100 calories. The berries will also give you a good punch of fiber, which helps keep you full and satisfied and wont spike blood sugar quickly like the jelly or syrup. 3. Swap the non-fat latte for black coffee with a splash of half-and-half. Contrary to its name, that non-fat latte has 130 calories and a startling 19g of carbohydrates per 16 oz. serving. Replacing that light drinkable dessert with a black coffee with a splash of half-and-half saves you more than 100 calories per 16 oz. serving. 4. Sprinkle salads with freeze-dried raspberries instead of dried cranberries. If you want a sweet addition to your nutritious salad, stay away from dried cranberries. They have a  whopping 130 calories per  cup and 30g carbohydrates. Instead, sprinkle freeze-dried raspberries guilt-free and save more than 100 calories per  cup serving, adding 3g of belly-filling fiber. 5. Go for mustard in place of mayo on your sandwich. Mustard can add really nice flavor to any sandwich, and there are tons of varieties, from spicy to honey. A serving of mayo is 95 calories, versus 10 calories in a serving of mustard.  Or try an avocado mayo spread: You can find the recipe few click this link: https://www.californiaavocado.com/recipes/recipe-container/[place]-avocado-mayo 6. Choose a DIY salad dressing instead of the store-bought kind. Mix Dijon or whole grain mustard with low-fat Kefir or red wine vinegar and garlic. 7. Use hummus as a spread instead of a dip. Use hummus as a spread on a high-fiber cracker or tortilla with a sandwich and save on calories without sacrificing taste. 8. Pick just one salad accessory. Salad isnt automatically a calorie winner. Its easy to over-accessorize with toppings. Instead of topping your salad with nuts, avocado and cranberries (all three will clock in at 313 calories), just pick one. The next day, choose a different accessory, which will also keep your salad interesting. You dont wear all your jewelry every day, right? 9. Ditch the white pasta in favor of spaghetti squash. One cup of cooked spaghetti squash has about 40 calories, compared with traditional spaghetti, which comes with more than 200. Spaghetti squash is also nutrient-dense. Its a good source of fiber and Vitamins A and C, and it can be eaten just like you would eat pasta--with a great tomato sauce and [place] meatballs or with pesto, tofu and spinach, for example. 10. Dress up your chili, soups and stews with non-fat [place] yogurt instead of sour cream. Just a dollop of sour cream can set you back 115 calories and a whopping 12g of fat--seven of which are of the artery-clogging  variety. Added bonus: [place] yogurt is packed with muscle-building protein, calcium and B Vitamins. 11. Mash cauliflower instead of mashed potatoes. One cup of traditional mashed potatoes--in all their creamy goodness--has more than 200  calories, compared to mashed cauliflower, which you can typically eat for less than 100 calories per 1 cup serving. Cauliflower is a great source of the antioxidant indole-3-carbinol (I3C), which may help reduce the risk of some cancers, like breast cancer. 12. Ditch the ice cream sundae in favor of a Greek yogurt parfait. Instead of a cup of ice cream or fro-yo for dessert, try 1 cup of nonfat Greek yogurt topped with fresh berries and a sprinkle of cacao nibs. Both toppings are packed with antioxidants, which can help reduce cellular inflammation and oxidative damage. And the comparison is a no-brainer: One cup of ice cream has about 275 calories; one cup of frozen yogurt has about 230; and a cup of Greek yogurt has just 130, plus twice the protein, so you're less likely to return to the freezer for a second helping. 13. Put olive oil in a spray container instead of using it directly from the bottle. Each tablespoon of olive oil is 120 calories and 15g of fat. Use a mister instead of pouring it straight into the pan or onto a salad. This allows for portion control and will save you more than 100 calories. 14. When baking, substitute canned pumpkin for butter or oil. Canned pumpkin--not pumpkin pie mix--is loaded with Vitamin A, which is important for skin and eye health, as well as immunity. And the comparisons are pretty crazy:  cup of canned pumpkin has about 40 calories, compared to butter or oil, which has more than 800 calories. Yes, 800 calories. Applesauce and mashed banana can also serve as good substitutions for butter or oil, usually in a 1:1 ratio. 15. Top casseroles with high-fiber cereal instead of breadcrumbs. Breadcrumbs are typically made with white bread,  while breakfast cereals contain 5-9g of fiber per serving. Not only will you save more than 150 calories per  cup serving, the swap will also keep you more full and you'll get a metabolism boost from the added fiber. 16. Snack on pistachios instead of macadamia nuts. Believe it or not, you get the same amount of calories from 35 pistachios (100 calories) as you would from only five macadamia nuts. 17. Chow down on kale chips rather than potato chips. This is my favorite 'don't knock it 'till you try it' swap. Kale chips are so easy to make at home, and you can spice them up with a little grated parmesan or chili powder. Plus, they're a mere fraction of the calories of potato chips, but with the same crunch factor we crave so often. 18. Add seltzer and some fruit slices to your cocktail instead of soda or fruit juice. One cup of soda or fruit juice can pack on as much as 140 calories. Instead, use seltzer and fruit slices. The fruit provides valuable phytochemicals, such as flavonoids and anthocyanins, which help to combat cancer and stave off the aging process.             Guidelines for a Low Cholesterol, Low Saturated Fat Diet   Fats - Limit total intake of fats and oils. - Avoid butter, stick margarine, shortening, lard, palm and coconut oils. - Limit mayonnaise, salad dressings, gravies and sauces, unless they are homemade with low-fat ingredients. - Limit chocolate. - Choose low-fat and nonfat products, such as low-fat mayonnaise, low-fat or non-hydrogenated peanut butter, low-fat or fat-free salad dressings and nonfat gravy. - Use vegetable oil, such as canola or olive oil. - Look for margarine that does not contain trans fatty acids. -   Use nuts in moderate amounts. - Read ingredient labels carefully to determine both amount and type of fat present in foods. Limit saturated and trans fats! - Avoid high-fat processed and convenience foods.  Meats and Meat Alternatives -  Choose fish, chicken, turkey and lean meats. - Use dried beans, peas, lentils and tofu. - Limit egg yolks to three to four per week. - If you eat red meat, limit to no more than three servings per week and choose loin or round cuts. - Avoid fatty meats, such as bacon, sausage, franks, luncheon meats and ribs. - Avoid all organ meats, including liver.  Dairy - Choose nonfat or low-fat milk, yogurt and cottage cheese. - Most cheeses are high in fat. Choose cheeses made from non-fat milk, such as mozzarella and ricotta cheese. - Choose light or fat-free cream cheese and sour cream. - Avoid cream and sauces made with cream.  Fruits and Vegetables - Eat a wide variety of fruits and vegetables. - Use lemon juice, vinegar or "mist" olive oil on vegetables. - Avoid adding sauces, fat or oil to vegetables.  Breads, Cereals and Grains - Choose whole-grain breads, cereals, pastas and rice. - Avoid high-fat snack foods, such as granola, cookies, pies, pastries, doughnuts and croissants.  Cooking Tips - Avoid deep fried foods. - Trim visible fat off meats and remove skin from poultry before cooking. - Bake, broil, boil, poach or roast poultry, fish and lean meats. - Drain and discard fat that drains out of meat as you cook it. - Add little or no fat to foods. - Use vegetable oil sprays to grease pans for cooking or baking. - Steam vegetables. - Use herbs or no-oil marinades to flavor foods.  

## 2017-08-05 NOTE — Progress Notes (Signed)
Assessment and plan:  1. Fatigue, unspecified type   2. Vitamin D deficiency   3. Elevated LDL cholesterol level   4. Elevated TSH, N T4   5. Acne, unspecified acne type      Referral to Dermatology Per Patient Request for Skin Evaluation & Acne Treatment - Patient desires referral to dermatology.  1. Recent labs (07/02/2017) reviewed in detail with patient today: - CBC = WNL, aside from hemoglobin on high end of normal. - Liver Health = WNL. - Kidney Health = WNL. - Electrolytes = WNL. - Calcium = WNL. - A1c = 5.1, WNL.  2. Vitamin D Deficiency - Vitamin D = 23.6, deficient. - Reviewed goal at 50-60. - Twice weekly prescription ergocalciferol prescribed today.   - Take one tablet on Wednesday & one on Sunday.  3. Thyroid - Elevated TSH; Subclinical Hypothyroidism - TSH = 5.750, elevated. - T4 = 1.09, WNL.  4. Cholesterol - Elevated LDL - LDL = 103, elevated. - HDL = 44, WNL. - Triglycerides = 72.  - To reduce LDL and improve cholesterol, dietary changes such as low saturated & trans fat and low carb/ ketogenic diets discussed in detail with patient.  Encouraged regular exercise and weight loss when appropriate.   - Reviewed prudent dietary habits in detail.  Extensive counseling performed in office. - Educational handouts provided at patient's desire.  5. BMI Counseling Explained to patient what BMI refers to, and what it means medically.    Told patient to think about it as a "medical risk stratification measurement" and how increasing BMI is associated with increasing risk/ or worsening state of various diseases such as hypertension, hyperlipidemia, diabetes, premature OA, depression etc.  American Heart Association guidelines for healthy diet, basically Mediterranean diet, and exercise guidelines of 30 minutes 5 days per week or more discussed in detail.  Health counseling performed.  All questions  answered.  6. Lifestyle & Preventative Health Maintenance - Advised patient to continue working toward exercising to improve overall mental, physical, and emotional health.    - Continue to exercise to a goal of at least 30-45 minutes per day.  Engage in daily physical activity, especially a dedicated exercise routine.  Recommended that the patient eventually strive for at least 150 minutes of moderate cardiovascular activity per week according to guidelines established by the [facility].   - Healthy dietary habits encouraged, including low-carb, and high amounts of lean protein in diet.  - Encouraged patient to continue using Weight Watchers to track nutritional intake. - Strongly advised patient to begin attending meetings or come in to clinic for wt loss goals.  - Patient should also consume adequate amounts of water - half of body weight in oz of water per day.   Education and routine counseling performed. Handouts provided.  7. Follow-Up - Return for lab-only re-check thyroid & Vitamin D3 in 6 weeks, TSH, T4, T3.  - Patient knows that she can follow up here at the clinic for weight loss concerns as desired.   - Return for OV in 8 weeks.  Goal: 1 lb per week = 8 lbs weight loss in 8 weeks.  - Return to clinic for regularly scheduled chronic follow-up.   Orders Placed This Encounter  Procedures  . VITAMIN D 25 Hydroxy (Vit-D Deficiency, Fractures)  . TSH+T4F+T3Free  . Ambulatory referral to Dermatology    Meds ordered this encounter  Medications  . DISCONTD: Vitamin D, Ergocalciferol, (DRISDOL) 50000 units CAPS capsule  Sig: 1 tab Every wed and sun    Dispense:  24 capsule    Refill:  3  . Vitamin D, Ergocalciferol, (DRISDOL) 50000 units CAPS capsule    Sig: 1 tab Every wed and sun    Dispense:  24 capsule    Refill:  3     Return for 6 weeks for lab only then 8 weeks with me.  Goal 8lb wt loss.   Anticipatory guidance and routine counseling done re: condition, txmnt  options and need for follow up. All questions of patient's were answered.   Gross side effects, risk and benefits, and alternatives of medications discussed with patient.  Patient is aware that all medications have potential side effects and we are unable to predict every sideeffect or drug-drug interaction that may occur.  Expresses verbal understanding and consents to current therapy plan and treatment regiment.  Please see AVS handed out to patient at the end of our visit for additional patient instructions/ counseling done pertaining to today's office visit.  Note: This document was prepared using Dragon voice recognition software and may include unintentional dictation errors.  This document serves as a record of services personally performed by Mellody Dance, DO. It was created on her behalf by Toni Amend, a trained medical scribe. The creation of this record is based on the scribe's personal observations and the provider's statements to them.   I have reviewed the above medical documentation for accuracy and completeness and I concur.  Mellody Dance 08/18/17 10:49 AM   ----------------------------------------------------------------------------------------------------------------------  Subjective:   CC:   Dana Gonzalez is a 27 y.o. female who presents to [place] [facility] today for review and discussion of recent bloodwork that was done.  1. All recent blood work that we ordered was reviewed with patient today.  Patient was counseled on all abnormalities and we discussed dietary and lifestyle changes that could help those values (also medications when appropriate).  Extensive health counseling performed and all patient's concerns/ questions were addressed.   Patient was prescribed Maxalt for migraines, but has not needed to take this yet.  Patient notes that she's tried to reduce her intake of fatty carbs, and is happy to see this improvement in  her lab values.  Notes she used to eat lots of [employer] cookies "and things like that."  Total weight loss of 16 lbs so far.   Wt Readings from Last 3 Encounters:  08/05/17 182 lb (82.6 kg)  07/21/17 187 lb (84.8 kg)  06/30/17 189 lb 8 oz (86 kg)   BP Readings from Last 3 Encounters:  08/05/17 117/81  07/21/17 114/64  06/30/17 109/76   Pulse Readings from Last 3 Encounters:  08/05/17 65  07/21/17 67  06/30/17 68   BMI Readings from Last 3 Encounters:  08/05/17 28.51 kg/m  07/21/17 29.29 kg/m  06/30/17 29.68 kg/m     Patient Care Team    Relationship Specialty Notifications Start End  Mellody Dance, DO PCP - General Family Medicine  08/05/17     Full medical history updated and reviewed in the office today  Patient Active Problem List   Diagnosis Date Noted  . Family history of breast cancer 01/13/2015    Priority: Medium  . Vitamin D deficiency 08/05/2017  . Elevated LDL cholesterol level 08/05/2017  . Elevated TSH, N T4 08/05/2017  . Acne 08/05/2017  . Pelvic pain 07/21/2017  . IUD (intrauterine device) in place 07/21/2017  . Overweight (BMI 25.0-29.9) 06/30/2017  .  Gastroesophageal reflux disease 06/30/2017  . Family history of diabetes mellitus (DM) 06/30/2017  . Hypertriglyceridemia without hypercholesterolemia 06/30/2017  . Migraines 07/29/2016  . Genetic testing 09/26/2015  . Weight gain 08/06/2015  . Fatigue 08/06/2015    Past Medical History:  Diagnosis Date  . Family history of breast cancer   . GERD (gastroesophageal reflux disease)   . Headache(784.0)   . Infection    UTI  . Thyroid disease     Past Surgical History:  Procedure Laterality Date  . MOUTH SURGERY      Social History   Tobacco Use  . Smoking status: Former Smoker    Packs/day: 0.25    Years: 1.00    Pack years: 0.25    Types: Cigarettes    Last attempt to quit: 05/22/2012    Years since quitting: 5.2  . Smokeless tobacco: Never Used  . Tobacco comment: Aug 2015    Substance Use Topics  . Alcohol use: No    Comment: No THC use since age 24    Family Hx: Family History  Problem Relation Age of Onset  . Cancer Mother 26       breast to bone  . Hypertension Father   . Diabetes Maternal Grandmother   . Stroke Maternal Grandmother   . Heart disease Paternal Grandmother   . Breast cancer Maternal Aunt 28  . Mental retardation Maternal Aunt   . Breast cancer Maternal Aunt 40       dx twice, another time in her 30s-50s     Medications: Current Outpatient Medications  Medication Sig Dispense Refill  . levonorgestrel (MIRENA) 20 MCG/24HR IUD 1 each by Intrauterine route once.    . Multiple Vitamin (MULTIVITAMIN) capsule Take 1 capsule by mouth daily.    . rizatriptan (MAXALT) 10 MG tablet May repeat in 2 hours if needed 10 tablet 0  . Vitamin D, Ergocalciferol, (DRISDOL) 50000 units CAPS capsule 1 tab Every wed and sun 24 capsule 3   No current facility-administered medications for this visit.     Allergies:  Allergies  Allergen Reactions  . Influenza Vaccines Hives  . Keflex [Cephalexin]   . Latex Rash     Review of Systems: General:   No F/C, wt loss Pulm:   No DIB, SOB, pleuritic chest pain Card:  No CP, palpitations Abd:  No n/v/d or pain Ext:  No inc edema from baseline  Objective:  Blood pressure 117/81, pulse 65, height 5\' 7"  (1.702 m), weight 182 lb (82.6 kg), SpO2 99 %. Body mass index is 28.51 kg/m. Gen:   Well NAD, A and O *3 HEENT:    [place]/AT, EOMI,  MMM Lungs:   Normal work of breathing. CTA B/L, no Wh, rhonchi Heart:   RRR, S1, S2 WNL's, no MRG Abd:   No gross distention Exts:    warm, pink,  Brisk capillary refill, warm and well perfused.  Psych:    No HI/SI, judgement and insight good, Euthymic mood. Full Affect.   Recent Results (from the past 2160 hour(s))  CBC with Differential/Platelet     Status: Abnormal   Collection Time: 07/02/17  8:21 AM  Result Value Ref Range   WBC 5.5 3.4 - 10.8 x10E3/uL   RBC  4.86 3.77 - 5.28 x10E6/uL   Hemoglobin 16.0 (H) 11.1 - 15.9 g/dL   Hematocrit 46.2 34.0 - 46.6 %   MCV 95 79 - 97 fL   MCH 32.9 26.6 - 33.0 pg   MCHC 34.6  31.5 - 35.7 g/dL   RDW 12.5 12.3 - 15.4 %   Platelets 247 150 - 450 x10E3/uL   Neutrophils 44 Not Estab. %   Lymphs 44 Not Estab. %   Monocytes 8 Not Estab. %   Eos 3 Not Estab. %   Basos 1 Not Estab. %   Neutrophils Absolute 2.4 1.4 - 7.0 x10E3/uL   Lymphocytes Absolute 2.5 0.7 - 3.1 x10E3/uL   Monocytes Absolute 0.4 0.1 - 0.9 x10E3/uL   EOS (ABSOLUTE) 0.2 0.0 - 0.4 x10E3/uL   Basophils Absolute 0.0 0.0 - 0.2 x10E3/uL   Immature Granulocytes 0 Not Estab. %   Immature Grans (Abs) 0.0 0.0 - 0.1 x10E3/uL  Comprehensive metabolic panel     Status: None   Collection Time: 07/02/17  8:21 AM  Result Value Ref Range   Glucose 97 65 - 99 mg/dL   BUN 9 6 - 20 mg/dL   Creatinine, Ser 0.85 0.57 - 1.00 mg/dL   GFR calc non Af Amer 94 >59 mL/min/1.73   GFR calc Af Amer 108 >59 mL/min/1.73   BUN/Creatinine Ratio 11 9 - 23   Sodium 142 134 - 144 mmol/L   Potassium 4.3 3.5 - 5.2 mmol/L   Chloride 103 96 - 106 mmol/L   CO2 23 20 - 29 mmol/L   Calcium 9.5 8.7 - 10.2 mg/dL   Total Protein 7.1 6.0 - 8.5 g/dL   Albumin 4.7 3.5 - 5.5 g/dL   Globulin, Total 2.4 1.5 - 4.5 g/dL   Albumin/Globulin Ratio 2.0 1.2 - 2.2   Bilirubin Total 0.5 0.0 - 1.2 mg/dL   Alkaline Phosphatase 51 39 - 117 IU/L   AST 25 0 - 40 IU/L   ALT 28 0 - 32 IU/L  Hemoglobin A1c     Status: None   Collection Time: 07/02/17  8:21 AM  Result Value Ref Range   Hgb A1c MFr Bld 5.1 4.8 - 5.6 %    Comment:          Prediabetes: 5.7 - 6.4          Diabetes: >6.4          Glycemic control for adults with diabetes: <7.0    Est. average glucose Bld gHb Est-mCnc 100 mg/dL  Lipid panel     Status: Abnormal   Collection Time: 07/02/17  8:21 AM  Result Value Ref Range   Cholesterol, Total 158 100 - 199 mg/dL   Triglycerides 72 0 - 149 mg/dL   HDL 44 >39 mg/dL   VLDL  Cholesterol Cal 14 5 - 40 mg/dL   LDL Calculated 100 (H) 0 - 99 mg/dL   Chol/HDL Ratio 3.6 0.0 - 4.4 ratio    Comment:                                   T. Chol/HDL Ratio                                             Men  Women                               1/2 Avg.Risk  3.4    3.3  Avg.Risk  5.0    4.4                                2X Avg.Risk  9.6    7.1                                3X Avg.Risk 23.4   11.0   T4, free     Status: None   Collection Time: 07/02/17  8:21 AM  Result Value Ref Range   Free T4 1.09 0.82 - 1.77 ng/dL  TSH     Status: Abnormal   Collection Time: 07/02/17  8:21 AM  Result Value Ref Range   TSH 5.750 (H) 0.450 - 4.500 uIU/mL  VITAMIN D 25 Hydroxy (Vit-D Deficiency, Fractures)     Status: Abnormal   Collection Time: 07/02/17  8:21 AM  Result Value Ref Range   Vit D, 25-Hydroxy 23.6 (L) 30.0 - 100.0 ng/mL    Comment: Vitamin D deficiency has been defined by the [place] and an Endocrine Society practice guideline as a level of serum 25-OH vitamin D less than 20 ng/mL (1,2). The Endocrine Society went on to further define vitamin D insufficiency as a level between 21 and 29 ng/mL (2). 1. IOM (Institute of Medicine). 2010. Dietary reference    intakes for calcium and D. [place]: The    [employer]. 2. Holick MF, Binkley Shaniko, Bischoff-Ferrari HA, et al.    Evaluation, treatment, and prevention of vitamin D    deficiency: an Endocrine Society clinical practice    guideline. JCEM. 2011 Jul; 96(7):1911-30.   Direct LDL     Status: Abnormal   Collection Time: 07/02/17  8:21 AM  Result Value Ref Range   LDL Direct 103 (H) 0 - 99 mg/dL  Urinalysis, Routine w reflex microscopic     Status: Abnormal   Collection Time: 07/21/17  9:47 PM  Result Value Ref Range   Color, Urine STRAW (A) YELLOW   APPearance CLEAR CLEAR   Specific Gravity, Urine 1.005 1.005 - 1.030   pH 7.0 5.0 - 8.0   Glucose, UA  NEGATIVE NEGATIVE mg/dL   Hgb urine dipstick NEGATIVE NEGATIVE   Bilirubin Urine NEGATIVE NEGATIVE   Ketones, ur NEGATIVE NEGATIVE mg/dL   Protein, ur NEGATIVE NEGATIVE mg/dL   Nitrite NEGATIVE NEGATIVE   Leukocytes, UA NEGATIVE NEGATIVE    Comment: Performed at [facility], [address], [place],  [address]  Pregnancy, urine POC     Status: None   Collection Time: 07/21/17  9:48 PM  Result Value Ref Range   Preg Test, Ur NEGATIVE NEGATIVE    Comment:        THE SENSITIVITY OF THIS METHODOLOGY IS >24 mIU/mL

## 2017-09-15 ENCOUNTER — Other Ambulatory Visit: Payer: 59

## 2017-09-16 ENCOUNTER — Other Ambulatory Visit (INDEPENDENT_AMBULATORY_CARE_PROVIDER_SITE_OTHER): Payer: 59

## 2017-09-16 DIAGNOSIS — E559 Vitamin D deficiency, unspecified: Secondary | ICD-10-CM | POA: Diagnosis not present

## 2017-09-16 DIAGNOSIS — R7989 Other specified abnormal findings of blood chemistry: Secondary | ICD-10-CM | POA: Diagnosis not present

## 2017-09-17 LAB — VITAMIN D 25 HYDROXY (VIT D DEFICIENCY, FRACTURES): Vit D, 25-Hydroxy: 44.7 ng/mL (ref 30.0–100.0)

## 2017-09-17 LAB — TSH+T4F+T3FREE
FREE T4: 1.23 ng/dL (ref 0.82–1.77)
T3 FREE: 3.4 pg/mL (ref 2.0–4.4)
TSH: 6.65 u[IU]/mL — ABNORMAL HIGH (ref 0.450–4.500)

## 2017-09-22 ENCOUNTER — Telehealth: Payer: Self-pay | Admitting: Family Medicine

## 2017-09-22 NOTE — Telephone Encounter (Signed)
Patient was called by nurse on Friday 9/13 for lab results, patient calling back for these. Please call back when available

## 2017-09-23 NOTE — Telephone Encounter (Signed)
Please see lab result note. MPulliam, CMA/RT(R)

## 2017-09-26 ENCOUNTER — Other Ambulatory Visit: Payer: Self-pay

## 2017-09-26 DIAGNOSIS — L680 Hirsutism: Secondary | ICD-10-CM | POA: Diagnosis not present

## 2017-09-26 DIAGNOSIS — L709 Acne, unspecified: Secondary | ICD-10-CM | POA: Diagnosis not present

## 2017-09-26 DIAGNOSIS — D2372 Other benign neoplasm of skin of left lower limb, including hip: Secondary | ICD-10-CM | POA: Diagnosis not present

## 2017-09-26 DIAGNOSIS — L700 Acne vulgaris: Secondary | ICD-10-CM | POA: Diagnosis not present

## 2017-09-26 MED FILL — KETOCONAZOLE 2% SHAMPOO: 2 | 20 days supply | Qty: 120 | Fill #0

## 2017-09-26 MED FILL — FLUOCINONIDE 0.05 % SOLN: 0.05 | 20 days supply | Qty: 60 | Fill #0

## 2017-10-22 ENCOUNTER — Encounter: Payer: Self-pay | Admitting: Family Medicine

## 2017-10-24 MED FILL — SPIRONOLACTONE 25 MG TABS: 25 | 30 days supply | Qty: 60 | Fill #0

## 2017-10-27 MED FILL — VIT D2 1.25 MG (50,000 UNIT: 1.25 MG | 84 days supply | Qty: 24 | Fill #1

## 2017-11-05 ENCOUNTER — Ambulatory Visit (INDEPENDENT_AMBULATORY_CARE_PROVIDER_SITE_OTHER): Payer: 59 | Admitting: Family Medicine

## 2017-11-05 ENCOUNTER — Encounter: Payer: Self-pay | Admitting: Family Medicine

## 2017-11-05 VITALS — BP 114/78 | HR 64 | Resp 18 | Ht 67.0 in | Wt 173.0 lb

## 2017-11-05 DIAGNOSIS — L659 Nonscarring hair loss, unspecified: Secondary | ICD-10-CM

## 2017-11-05 DIAGNOSIS — E039 Hypothyroidism, unspecified: Secondary | ICD-10-CM

## 2017-11-05 DIAGNOSIS — E038 Other specified hypothyroidism: Secondary | ICD-10-CM | POA: Insufficient documentation

## 2017-11-05 DIAGNOSIS — R5383 Other fatigue: Secondary | ICD-10-CM

## 2017-11-05 MED ORDER — LEVOTHYROXINE SODIUM 25 MCG PO TABS: 25.0000 ug | ORAL_TABLET | Freq: Every day | ORAL | 1 refills | Status: AC

## 2017-11-05 MED ORDER — LEVOTHYROXINE SODIUM 25 MCG PO TABS: 25.0000 ug | ORAL_TABLET | Freq: Every day | ORAL | 1 refills | Status: DC

## 2017-11-05 MED FILL — LEVOTHYROXINE 25 MCG TABLET: 25 | 90 days supply | Qty: 90 | Fill #0

## 2017-11-05 NOTE — Progress Notes (Signed)
Impression and Recommendations:    1. Hair loss disorder   2. Fatigue, unspecified type   3. Subclinical hypothyroidism on labs with POSITIVE SX      Hair loss disorder - Plan: Ambulatory referral to Dermatology   Hair loss/fatigue -Reviewed TSH, T3, and T4 levels from September 2019 and June 2019 -Explained that although she is subclinically hypothyroidism, we can try a low dose of medication to treat -Explained sx of hyperthyroidism and instructed pt to contact us if she notices these sx -Explained sx of hypothyroidism including weight gain, hair loss, fatigue -Discussed a hair loss that specializes in hair loss that pt could see -Explained that this specialist takes roughly 6 months to see, so we can make the appointment now and see what works   Weight loss -explained that hair loss is often due to increased stress -Explained that increasing exercise increases blood flow to tissues which can help alleviate hair loss -Encouraged pt to find easy ways to increase activity level such as speed walking or splitting up workouts -Encouraged pt to begin using calorie tracking apps such as Lose It app or My Fitness Pal to help support weight loss -Explained that pt can return every month for weight loss support and to help with goal setting and accountability    Medications Discontinued During This Encounter  Medication Reason  . levothyroxine (SYNTHROID, LEVOTHROID) 25 MCG tablet Reorder  . spironolactone (ALDACTONE) 25 MG tablet Side effect (s)     Gross side effects, risk and benefits, and alternatives of medications and treatment plan in general discussed with patient.  Patient is aware that all medications have potential side effects and we are unable to predict every side effect or drug-drug interaction that may occur.   Patient will call with any questions prior to using medication if they have concerns.    Expresses verbal understanding and consents to current therapy  and treatment regimen.  No barriers to understanding were identified.  Red flag symptoms and signs discussed in detail.  Patient expressed understanding regarding what to do in case of emergency\urgent symptoms  Please see AVS handed out to patient at the end of our visit for further patient instructions/ counseling done pertaining to today's office visit.   Return for 6wks- lab;  then ov wiht me 2 mo.     Note:  This note was prepared with assistance of Dragon voice recognition software. Occasional wrong-word or sound-a-like substitutions may have occurred due to the inherent limitations of voice recognition software.  This document serves as a record of services personally performed by Mellody Dance, MD. It was created on her behalf by Georga Bora, a trained medical scribe. The creation of this record is based on the scribe's personal observations and the provider's statements to them.   I have reviewed the above medical documentation for accuracy and completeness and I concur.  Mellody Dance, D.O.        --------------------------------------------------------------------------------------------------------------------------------------------------------------------------------------------------------------------------------------------    Subjective:     HPI: Dana Gonzalez is a 27 y.o. female who presents to [place] [facility] today for issues as discussed below.  Fatigue/hair loss -Previously saw Dr. Denna Haggard  -Pt states she is experiencing hair loss and fatigue -Says she went to the dermatologist about her hair loss and the dermatologist sent her back here -states she was given a shampoo and conditioner by the dermatologist and after she used it, her hair started coming out in clumps -Says her dermatologist  believes its hormonal and told her to return to the PCP -Says she recently started a hair and nails vitamin since she noticed her hair was  falling out and hasn't seen any improvememnt -Says she has been experiencing fatigue and some intolerance of cold as well -States the dermatologist put her on a medication that she can't remember but she felt porrly after taking it -Says it made her feel drowsy "like I was in a cloud"  Weight loss -Pt has loss 9 lbs since her previous appointment -States she has not been exercising as much as she should recently    Wt Readings from Last 3 Encounters:  11/05/17 173 lb (78.5 kg)  08/05/17 182 lb (82.6 kg)  07/21/17 187 lb (84.8 kg)   BP Readings from Last 3 Encounters:  11/05/17 114/78  08/05/17 117/81  07/21/17 114/64   Pulse Readings from Last 3 Encounters:  11/05/17 64  08/05/17 65  07/21/17 67   BMI Readings from Last 3 Encounters:  11/05/17 27.10 kg/m  08/05/17 28.51 kg/m  07/21/17 29.29 kg/m     Patient Care Team    Relationship Specialty Notifications Start End  Mellody Dance, DO PCP - General Family Medicine  08/05/17      Patient Active Problem List   Diagnosis Date Noted  . Family history of breast cancer 01/13/2015    Priority: Medium  . Subclinical hypothyroidism on labs with POSITIVE SX 11/05/2017  . Vitamin D deficiency 08/05/2017  . Elevated LDL cholesterol level 08/05/2017  . Elevated TSH, N T4 08/05/2017  . Acne 08/05/2017  . Pelvic pain 07/21/2017  . IUD (intrauterine device) in place 07/21/2017  . Overweight (BMI 25.0-29.9) 06/30/2017  . Gastroesophageal reflux disease 06/30/2017  . Family history of diabetes mellitus (DM) 06/30/2017  . Hypertriglyceridemia without hypercholesterolemia 06/30/2017  . Migraines 07/29/2016  . Genetic testing 09/26/2015  . Weight gain 08/06/2015  . Fatigue 08/06/2015    Past Medical history, Surgical history, Family history, Social history, Allergies and Medications have been entered into the medical record, reviewed and changed as needed.    Current Meds  Medication Sig  . fluocinonide (LIDEX) 0.05 %  external solution   . ketoconazole (NIZORAL) 2 % shampoo   . levonorgestrel (MIRENA) 20 MCG/24HR IUD 1 each by Intrauterine route once.  . Multiple Vitamin (MULTIVITAMIN) capsule Take 1 capsule by mouth daily.  . rizatriptan (MAXALT) 10 MG tablet May repeat in 2 hours if needed  . Vitamin D, Ergocalciferol, (DRISDOL) 50000 units CAPS capsule 1 tab Every wed and sun    Allergies:  Allergies  Allergen Reactions  . Influenza Vaccines Hives  . Spironolactone Other (See Comments)    "swimmy headed"   . Keflex [Cephalexin]   . Latex Rash     Review of Systems:  A fourteen system review of systems was performed and found to be positive as per HPI.   Objective:   Blood pressure 114/78, pulse 64, resp. rate 18, height 5\' 7"  (1.702 m), weight 173 lb (78.5 kg), SpO2 99 %. Body mass index is 27.1 kg/m. General:  Well Developed, well nourished, appropriate for stated age.  Neuro:  Alert and oriented,  extra-ocular muscles intact  HEENT:  Normocephalic, atraumatic, neck supple, no carotid bruits appreciated  Skin:  no gross rash, warm, pink. Cardiac:  RRR, S1 S2 Respiratory:  ECTA B/L and A/P, Not using accessory muscles, speaking in full sentences- unlabored. Vascular:  Ext warm, no cyanosis apprec.; cap RF less 2 sec. Psych:  No HI/SI, judgement and insight good, Euthymic mood. Full Affect.

## 2017-11-05 NOTE — Patient Instructions (Signed)
6 wks f/up TSH, Free T3 & T4.   - otherwise  2 months for wt loss and reck of sx   Hypothyroidism Hypothyroidism is a disorder of the thyroid. The thyroid is a large gland that is located in the lower front of the neck. The thyroid releases hormones that control how the body works. With hypothyroidism, the thyroid does not make enough of these hormones. What are the causes? Causes of hypothyroidism may include:  Viral infections.  Pregnancy.  Your own defense system (immune system) attacking your thyroid.  Certain medicines.  Birth defects.  Past radiation treatments to your head or neck.  Past treatment with radioactive iodine.  Past surgical removal of part or all of your thyroid.  Problems with the gland that is located in the center of your brain (pituitary).  What are the signs or symptoms? Signs and symptoms of hypothyroidism may include:  Feeling as though you have no energy (lethargy).  Inability to tolerate cold.  Weight gain that is not explained by a change in diet or exercise habits.  Dry skin.  Coarse hair.  Menstrual irregularity.  Slowing of thought processes.  Constipation.  Sadness or depression.  How is this diagnosed? Your health care provider may diagnose hypothyroidism with blood tests and ultrasound tests. How is this treated? Hypothyroidism is treated with medicine that replaces the hormones that your body does not make. After you begin treatment, it may take several weeks for symptoms to go away. Follow these instructions at home:  Take medicines only as directed by your health care provider.  If you start taking any new medicines, tell your health care provider.  Keep all follow-up visits as directed by your health care provider. This is important. As your condition improves, your dosage needs may change. You will need to have blood tests regularly so that your health care provider can watch your condition. Contact a health care  provider if:  Your symptoms do not get better with treatment.  You are taking thyroid replacement medicine and: ? You sweat excessively. ? You have tremors. ? You feel anxious. ? You lose weight rapidly. ? You cannot tolerate heat. ? You have emotional swings. ? You have diarrhea. ? You feel weak. Get help right away if:  You develop chest pain.  You develop an irregular heartbeat.  You develop a rapid heartbeat. This information is not intended to replace advice given to you by your health care provider. Make sure you discuss any questions you have with your health care provider. Document Released: 12/24/2004 Document Revised: 06/01/2015 Document Reviewed: 05/11/2013 Elsevier Interactive Patient Education  2018 [employer].

## 2017-12-01 ENCOUNTER — Ambulatory Visit: Payer: Self-pay | Admitting: Podiatry

## 2017-12-19 ENCOUNTER — Other Ambulatory Visit: Payer: Self-pay

## 2017-12-19 ENCOUNTER — Other Ambulatory Visit: Payer: 59

## 2017-12-19 DIAGNOSIS — R7989 Other specified abnormal findings of blood chemistry: Secondary | ICD-10-CM

## 2017-12-19 DIAGNOSIS — R635 Abnormal weight gain: Secondary | ICD-10-CM

## 2017-12-19 DIAGNOSIS — R5383 Other fatigue: Secondary | ICD-10-CM

## 2017-12-19 DIAGNOSIS — E559 Vitamin D deficiency, unspecified: Secondary | ICD-10-CM

## 2017-12-19 DIAGNOSIS — E039 Hypothyroidism, unspecified: Secondary | ICD-10-CM

## 2017-12-19 DIAGNOSIS — E7800 Pure hypercholesterolemia, unspecified: Secondary | ICD-10-CM

## 2017-12-19 DIAGNOSIS — E781 Pure hyperglyceridemia: Secondary | ICD-10-CM

## 2017-12-19 DIAGNOSIS — E038 Other specified hypothyroidism: Secondary | ICD-10-CM

## 2017-12-19 DIAGNOSIS — Z833 Family history of diabetes mellitus: Secondary | ICD-10-CM

## 2018-01-12 ENCOUNTER — Encounter: Payer: Self-pay | Admitting: Podiatry

## 2018-01-12 ENCOUNTER — Other Ambulatory Visit: Payer: Self-pay | Admitting: Podiatry

## 2018-01-12 ENCOUNTER — Ambulatory Visit (INDEPENDENT_AMBULATORY_CARE_PROVIDER_SITE_OTHER): Payer: 59

## 2018-01-12 ENCOUNTER — Ambulatory Visit (INDEPENDENT_AMBULATORY_CARE_PROVIDER_SITE_OTHER): Payer: 59 | Admitting: Podiatry

## 2018-01-12 VITALS — BP 124/82 | HR 77

## 2018-01-12 DIAGNOSIS — M79672 Pain in left foot: Secondary | ICD-10-CM | POA: Diagnosis not present

## 2018-01-12 DIAGNOSIS — M7672 Peroneal tendinitis, left leg: Secondary | ICD-10-CM | POA: Diagnosis not present

## 2018-01-12 DIAGNOSIS — M79671 Pain in right foot: Secondary | ICD-10-CM | POA: Diagnosis not present

## 2018-01-12 DIAGNOSIS — M779 Enthesopathy, unspecified: Secondary | ICD-10-CM | POA: Diagnosis not present

## 2018-01-12 MED ORDER — TRIAMCINOLONE ACETONIDE 10 MG/ML IJ SUSP
10.0000 mg | Freq: Once | INTRAMUSCULAR | Status: AC
  Administered 2018-01-12: 10 mg

## 2018-01-14 NOTE — Progress Notes (Signed)
Subjective:   Patient ID: Dana Gonzalez, female   DOB: 28 y.o.   MRN: [id number]   HPI Patient presents with a bone sticking out outside of the left over right foot and states that it is become very tender over the last couple months and she has bumped it and it makes it hard to wear shoe gear comfortably.  Patient does not smoke and likes to be active   Review of Systems  All other systems reviewed and are negative.       Objective:  Physical Exam Vitals signs and nursing note reviewed.  Constitutional:      Appearance: She is well-developed.  Pulmonary:     Effort: Pulmonary effort is normal.  Musculoskeletal: Normal range of motion.  Skin:    General: Skin is warm.  Neurological:     Mental Status: She is alert.     Neurovascular status intact muscle strength is adequate range of motion within normal limits with patient noted to have inflammation pain of the base of the fifth metatarsal left with moderate prominence of the bone structure at the base.  The right also exhibits the same deformity but does not have the same inflammatory component and I did not note any damage as far as joint range of motion or indications of peroneal compromise     Assessment:  Probability for peroneal tendinitis left with trauma as the precipitating factor     Plan:  H&P x-rays reviewed and today I did a careful sterile prep of the left lateral foot explaining injection risk and injected 3 mg Kenalog 5 mg Xylocaine into the sheath at its insertion into the base of the fifth metatarsal and dispense fascial brace to lift up the side of the foot discussed aggressive ice therapy supportive shoe gear usage and reduced activity for the next several weeks.  I did explain the deformity that she has but I do not recommend correction currently unless it were to become chronic or persistent  X-ray indicates there is moderate enlargement base of the fifth metatarsal left and right with inflammation around  base of fifth metatarsal left but no acute injury

## 2018-01-19 ENCOUNTER — Other Ambulatory Visit: Payer: 59

## 2018-02-02 ENCOUNTER — Telehealth: Payer: 59 | Admitting: Family

## 2018-02-02 DIAGNOSIS — J069 Acute upper respiratory infection, unspecified: Secondary | ICD-10-CM | POA: Diagnosis not present

## 2018-02-02 MED ORDER — FLUTICASONE PROPIONATE 50 MCG/ACT NA SUSP: 2.0000 | Freq: Every day | NASAL | 6 refills | Status: AC

## 2018-02-02 NOTE — Progress Notes (Signed)
We are sorry you are not feeling well.  Here is how we plan to help!  Based on what you have shared with me, it looks like you may have a viral upper respiratory infection or a "common cold".  Colds are caused by a large number of viruses; however, rhinovirus is the most common cause.   Symptoms of the common cold vary from person to person, with common symptoms including sore throat, cough, and malaise.  A low-grade fever of 100.4 may present, but is often uncommon.  Symptoms vary however, and are closely related to a person's age or underlying illnesses.  The most common symptoms associated with the common cold are nasal discharge or congestion, cough, sneezing, headache and pressure in the ears and face.  Cold symptoms usually persist for about 3 to 10 days, but can last up to 2 weeks.  It is important to know that colds do not cause serious illness or complications in most cases.    The common cold is transmitted from person to person, with the most common method of transmission being a person's hands.  The virus is able to live on the skin and can infect other persons for up to 2 hours after direct contact.  Also, colds are transmitted when someone coughs or sneezes; thus, it is important to cover the mouth to reduce this risk.  To keep the spread of the common cold at [place], good hand hygiene is very important.  This is an infection that is most likely caused by a virus. There are no specific treatments for the common cold other than to help you with the symptoms until the infection runs its course.    For nasal congestion, you may use an oral decongestants such as Mucinex D or if you have glaucoma or high blood pressure use plain Mucinex.  Saline nasal spray or nasal drops can help and can safely be used as often as needed for congestion.  For your congestion, I have prescribed Fluticasone nasal spray one spray in each nostril twice a day. Your prescription was sent to [facility] [address]  [place], [place].  If you do not have a history of heart disease, hypertension, diabetes or thyroid disease, prostate/bladder issues or glaucoma, you may also use Sudafed to treat nasal congestion.  It is highly recommended that you consult with a pharmacist or your primary care physician to ensure this medication is safe for you to take.     If you have a cough, you may use cough suppressants such as Delsym and Robitussin.  If you have glaucoma or high blood pressure, you can also use Coricidin HBP.     If you have a sore or scratchy throat, use a saltwater gargle-  to  teaspoon of salt dissolved in a 4-ounce to 8-ounce glass of warm water.  Gargle the solution for approximately 15-30 seconds and then spit.  It is important not to swallow the solution.  You can also use throat lozenges/cough drops and Chloraseptic spray to help with throat pain or discomfort.  Warm or cold liquids can also be helpful in relieving throat pain.  For headache, pain or general discomfort, you can use Ibuprofen or Tylenol as directed.   Some authorities believe that zinc sprays or the use of Echinacea may shorten the course of your symptoms.   HOME CARE . Only take medications as instructed by your medical team. . Be sure to drink plenty of fluids. Water is fine as well as  fruit juices, sodas and electrolyte beverages. You may want to stay away from caffeine or alcohol. If you are nauseated, try taking small sips of liquids. How do you know if you are getting enough fluid? Your urine should be a pale yellow or almost colorless. . Get rest. . Taking a steamy shower or using a humidifier may help nasal congestion and ease sore throat pain. You can place a towel over your head and breathe in the steam from hot water coming from a faucet. . Using a saline nasal spray works much the same way. . Cough drops, hard candies and sore throat lozenges may ease your cough. . Avoid close contacts especially the very young and the  elderly . Cover your mouth if you cough or sneeze . Always remember to wash your hands.   GET HELP RIGHT AWAY IF: . You develop worsening fever. . If your symptoms do not improve within 10 days . You develop yellow or green discharge from your nose over 3 days. . You have coughing fits . You develop a severe head ache or visual changes. . You develop shortness of breath, difficulty breathing or start having chest pain . Your symptoms persist after you have completed your treatment plan  MAKE SURE YOU   Understand these instructions.  Will watch your condition.  Will get help right away if you are not doing well or get worse.  Your e-visit answers were reviewed by a board certified advanced clinical practitioner to complete your personal care plan. Depending upon the condition, your plan could have included both over the counter or prescription medications. Please review your pharmacy choice. If there is a problem, you may call our nursing hot line at and have the prescription routed to another pharmacy. Your safety is important to [place]. If you have drug allergies check your prescription carefully.   You can use MyChart to ask questions about today's visit, request a non-urgent call back, or ask for a work or school excuse for 24 hours related to this e-Visit. If it has been greater than 24 hours you will need to follow up with your provider, or enter a new e-Visit to address those concerns. You will get an e-mail in the next two days asking about your experience.  I hope that your e-visit has been valuable and will speed your recovery. Thank you for using e-visits.

## 2018-02-04 ENCOUNTER — Ambulatory Visit (INDEPENDENT_AMBULATORY_CARE_PROVIDER_SITE_OTHER): Payer: 59 | Admitting: Medical

## 2018-02-04 ENCOUNTER — Other Ambulatory Visit (HOSPITAL_COMMUNITY)
Admission: RE | Admit: 2018-02-04 | Discharge: 2018-02-04 | Disposition: A | Discharge status: Home / Self Care | Payer: 59 | Source: Ambulatory Visit | Attending: Medical | Admitting: Medical

## 2018-02-04 ENCOUNTER — Encounter: Payer: Self-pay | Admitting: Medical

## 2018-02-04 VITALS — BP 130/85 | HR 76 | Ht 67.0 in | Wt 176.1 lb

## 2018-02-04 DIAGNOSIS — Z30432 Encounter for removal of intrauterine contraceptive device: Secondary | ICD-10-CM | POA: Diagnosis not present

## 2018-02-04 DIAGNOSIS — Z01419 Encounter for gynecological examination (general) (routine) without abnormal findings: Secondary | ICD-10-CM | POA: Insufficient documentation

## 2018-02-04 MED ORDER — NORETHIN-ETH ESTRAD-FE BIPHAS 1 MG-10 MCG / 10 MCG PO TABS
1.0000 | ORAL_TABLET | Freq: Every day | ORAL | 11 refills | Status: DC
Start: 2018-02-04 — End: 2018-04-20

## 2018-02-04 NOTE — Progress Notes (Signed)
Subjective:    Dana Gonzalez is a 28 y.o. female who presents for an annual exam. The patient has no complaints today. The patient is sexually active. GYN screening history: last pap: approximate date 2016 and was normal. The patient wears seatbelts: yes. The patient participates in regular exercise: yes. Has the patient ever been transfused or tattooed?: no. The patient reports that there is not domestic violence in her life.   Menstrual History: OB History    Gravida  4   Para  4   Term  2   Preterm  2   AB  0   Living  3     SAB  0   TAB  0   Ectopic  0   Multiple  0   Live Births  3           Menarche age: 39 No LMP recorded. (Menstrual status: IUD).    The following portions of the patient's history were reviewed and updated as appropriate: allergies, current medications, past family history, past medical history, past social history, past surgical history and problem list.  Review of Systems Pertinent items are noted in HPI.    Objective:   Physical Exam  Nursing note and vitals reviewed. Constitutional: She is oriented to person, place, and time. She appears well-developed and well-nourished. No distress.  HENT:  Head: Normocephalic and atraumatic.  Eyes: EOM are normal.  Neck: Normal range of motion. Neck supple. No thyromegaly present.  Cardiovascular: Normal rate, regular rhythm and normal heart sounds.  No murmur heard. Respiratory: Effort normal and breath sounds normal. No respiratory distress. She has no wheezes.  GI: Soft. Bowel sounds are normal. She exhibits no distension and no mass. There is no abdominal tenderness. There is no rebound and no guarding.  Genitourinary: Cervix exhibits no motion tenderness, no discharge and no friability.    Vaginal discharge (scant white) present.     No vaginal bleeding.  No bleeding in the vagina.  Neurological: She is alert and oriented to person, place, and time.  Skin: Skin is warm and dry. No  erythema.  Psychiatric: She has a normal mood and affect.   GYNECOLOGY CLINIC PROCEDURE NOTE  Ms. Donika Butner is a 28 y.o. [phone] here for Mirena IUD removal. No GYN concerns.  IUD Removal  Patient was in the dorsal lithotomy position, normal external genitalia was noted.  A speculum was placed in the patient's vagina, normal discharge was noted, no lesions. The multiparous cervix was visualized, no lesions, no abnormal discharge.  The strings of the IUD were grasped and pulled using ring forceps. The IUD was removed in its entirety. Patient tolerated the procedure well.    Patient will use OCPs for contraception.  Routine preventative health maintenance measures emphasized.   Assessment:    Healthy female exam.   Birth control counseling IUD removal   Plan:     All questions answered. Await pap smear results.   Rx for OCPs sent to patient's pharmacy  Advised condom use for first month with OCPs Patient to return in 1 year for annual exam or sooner PRN  Luvenia Redden, PA-C 02/04/2018 12:00 PM

## 2018-02-04 NOTE — Patient Instructions (Signed)

## 2018-02-05 ENCOUNTER — Ambulatory Visit: Payer: 59 | Admitting: Family Medicine

## 2018-02-06 DIAGNOSIS — H5213 Myopia, bilateral: Secondary | ICD-10-CM | POA: Diagnosis not present

## 2018-02-09 LAB — CYTOLOGY - PAP
CHLAMYDIA, DNA PROBE: NEGATIVE
DIAGNOSIS: NEGATIVE
NEISSERIA GONORRHEA: NEGATIVE

## 2018-02-16 ENCOUNTER — Ambulatory Visit: Payer: 59 | Admitting: Family Medicine

## 2018-04-16 ENCOUNTER — Telehealth: Payer: 59 | Admitting: Physician Assistant

## 2018-04-16 DIAGNOSIS — R112 Nausea with vomiting, unspecified: Secondary | ICD-10-CM

## 2018-04-16 DIAGNOSIS — Z3490 Encounter for supervision of normal pregnancy, unspecified, unspecified trimester: Secondary | ICD-10-CM

## 2018-04-16 NOTE — Progress Notes (Signed)
For the safety of you and your child, I recommend a face to face office visit with a health care provider.  Many mothers need to take medicines during their pregnancy and while nursing.  After reviewing your E-Visit request, I recommend that you consult your OB/GYN or pediatrician for medical advice in relation to your condition and prescription medications while pregnant or breastfeeding.    NOTE: If you entered your credit card information for this eVisit, you will not be charged. You may see a "hold" on your card for the $35 but that hold will drop off and you will not have a charge processed.  If you are having a true medical emergency please call 911.  If you need an urgent face to face visit, [facility] has four urgent care centers for your convenience.  If you need care fast and have a high deductible or no insurance consider:   [email or web address] to reserve your spot online an avoid wait times  [facility] [address], [address] [place], [place] [address] Modified hours of operation: Monday-Friday, 10 AM to 6 PM  Saturday & Sunday 10 AM to 4 PM  [facility] (New Address!) [address], [place], [place] [address] *Just off [address], across the road from [place] hours of operation: Monday-Friday, 10 AM to 5 PM  Closed Saturday & Sunday  [facility]'s modified hours of operation will be in effect from Wednesday, April 1st through Thursday, April 30th.  The following sites will take your  insurance:  . [facility] [place] a Provider at this Location  [address] [place], [place] [address] . 10 am to 8 pm Monday-Friday . 12 pm to 8 pm Saturday-Sunday   . [facility] [place] a Provider at this Location  [address], [place], [place] [address] . 8 am to  8 pm Monday-Friday . 9 am to 6 pm Saturday . 11 am to 6 pm Sunday   . [facility] at [place] Get Driving Directions  [address] [place], [place] [address] . 8 am to 8 pm Monday-Friday . 8 am to 4 pm Saturday-Sunday   Your e-visit answers were reviewed by a board certified advanced clinical practitioner to complete your personal care plan.  Thank you for using e-Visits.

## 2018-04-19 ENCOUNTER — Encounter (HOSPITAL_COMMUNITY): Payer: Self-pay | Admitting: *Deleted

## 2018-04-19 ENCOUNTER — Inpatient Hospital Stay (HOSPITAL_COMMUNITY)
Admission: AD | Admit: 2018-04-19 | Discharge: 2018-04-20 | Disposition: A | Discharge status: Home / Self Care | Payer: 59 | Attending: Obstetrics & Gynecology | Admitting: Obstetrics & Gynecology

## 2018-04-19 ENCOUNTER — Other Ambulatory Visit: Payer: Self-pay

## 2018-04-19 DIAGNOSIS — Z3201 Encounter for pregnancy test, result positive: Secondary | ICD-10-CM | POA: Diagnosis not present

## 2018-04-19 DIAGNOSIS — R109 Unspecified abdominal pain: Secondary | ICD-10-CM

## 2018-04-19 DIAGNOSIS — O3680X0 Pregnancy with inconclusive fetal viability, not applicable or unspecified: Secondary | ICD-10-CM | POA: Insufficient documentation

## 2018-04-19 DIAGNOSIS — Z3A01 Less than 8 weeks gestation of pregnancy: Secondary | ICD-10-CM | POA: Insufficient documentation

## 2018-04-19 DIAGNOSIS — Z79899 Other long term (current) drug therapy: Secondary | ICD-10-CM | POA: Diagnosis not present

## 2018-04-19 DIAGNOSIS — Z793 Long term (current) use of hormonal contraceptives: Secondary | ICD-10-CM | POA: Insufficient documentation

## 2018-04-19 DIAGNOSIS — R110 Nausea: Secondary | ICD-10-CM | POA: Diagnosis not present

## 2018-04-19 DIAGNOSIS — R102 Pelvic and perineal pain: Secondary | ICD-10-CM | POA: Insufficient documentation

## 2018-04-19 DIAGNOSIS — Z87891 Personal history of nicotine dependence: Secondary | ICD-10-CM | POA: Insufficient documentation

## 2018-04-19 DIAGNOSIS — O26891 Other specified pregnancy related conditions, first trimester: Secondary | ICD-10-CM | POA: Insufficient documentation

## 2018-04-19 LAB — POCT PREGNANCY, URINE: Preg Test, Ur: POSITIVE — AB

## 2018-04-19 NOTE — MAU Note (Signed)
Pt here for constant LLQ cramping that started several days ago. Pt denies vaginal bleeding or discharge. Has not tried anythign for pain. States she is pregnant. LMP 03/07/18.

## 2018-04-20 ENCOUNTER — Inpatient Hospital Stay (HOSPITAL_COMMUNITY): Payer: 59

## 2018-04-20 ENCOUNTER — Encounter (HOSPITAL_COMMUNITY): Payer: Self-pay

## 2018-04-20 DIAGNOSIS — Z793 Long term (current) use of hormonal contraceptives: Secondary | ICD-10-CM | POA: Diagnosis not present

## 2018-04-20 DIAGNOSIS — Z79899 Other long term (current) drug therapy: Secondary | ICD-10-CM | POA: Diagnosis not present

## 2018-04-20 DIAGNOSIS — Z3A01 Less than 8 weeks gestation of pregnancy: Secondary | ICD-10-CM

## 2018-04-20 DIAGNOSIS — R109 Unspecified abdominal pain: Secondary | ICD-10-CM | POA: Diagnosis not present

## 2018-04-20 DIAGNOSIS — R110 Nausea: Secondary | ICD-10-CM | POA: Diagnosis not present

## 2018-04-20 DIAGNOSIS — O26891 Other specified pregnancy related conditions, first trimester: Secondary | ICD-10-CM | POA: Diagnosis not present

## 2018-04-20 DIAGNOSIS — Z87891 Personal history of nicotine dependence: Secondary | ICD-10-CM | POA: Diagnosis not present

## 2018-04-20 DIAGNOSIS — Z3201 Encounter for pregnancy test, result positive: Secondary | ICD-10-CM | POA: Diagnosis not present

## 2018-04-20 DIAGNOSIS — O3680X0 Pregnancy with inconclusive fetal viability, not applicable or unspecified: Secondary | ICD-10-CM

## 2018-04-20 DIAGNOSIS — R102 Pelvic and perineal pain: Secondary | ICD-10-CM | POA: Diagnosis not present

## 2018-04-20 LAB — URINALYSIS, ROUTINE W REFLEX MICROSCOPIC
Bilirubin Urine: NEGATIVE
Glucose, UA: NEGATIVE mg/dL
Hgb urine dipstick: NEGATIVE
Ketones, ur: NEGATIVE mg/dL
Leukocytes,Ua: NEGATIVE
Nitrite: NEGATIVE
Protein, ur: NEGATIVE mg/dL
Specific Gravity, Urine: 1.009 (ref 1.005–1.030)
pH: 6 (ref 5.0–8.0)

## 2018-04-20 LAB — CBC
HCT: 40.2 % (ref 36.0–46.0)
Hemoglobin: 14 g/dL (ref 12.0–15.0)
MCH: 32.4 pg (ref 26.0–34.0)
MCHC: 34.8 g/dL (ref 30.0–36.0)
MCV: 93.1 fL (ref 80.0–100.0)
Platelets: 213 10*3/uL (ref 150–400)
RBC: 4.32 MIL/uL (ref 3.87–5.11)
RDW: 12.1 % (ref 11.5–15.5)
WBC: 10.1 10*3/uL (ref 4.0–10.5)
nRBC: 0 % (ref 0.0–0.2)

## 2018-04-20 LAB — HCG, QUANTITATIVE, PREGNANCY: hCG, Beta Chain, Quant, S: 86198 m[IU]/mL — ABNORMAL HIGH (ref <5)

## 2018-04-20 NOTE — MAU Provider Note (Signed)
History     CSN: [id number]  Arrival date and time: 04/19/18 2321   First Provider Initiated Contact with Patient 04/19/18 2357      Chief Complaint  Patient presents with  . Possible Pregnancy  . Pelvic Pain  . Nausea   Ms. Dana Gonzalez presents with abdominal pain She states that the pain has been going on for about 10 days She has a history of a fetal demise, and since then has been more nervous Based on LMP, she's about [redacted] weeks pregnant and has not had any care so far Pain is mild Started out intermittent, but now is constant and slightly more intense Denies vaginal bleeding, pain, itching, burning or discharge Normal urination and bowel movements   Past Medical History:  Diagnosis Date  . Family history of breast cancer   . GERD (gastroesophageal reflux disease)   . Headache(784.0)   . Infection    UTI  . Thyroid disease     Past Surgical History:  Procedure Laterality Date  . MOUTH SURGERY      Family History  Problem Relation Age of Onset  . Cancer Mother 95       breast to bone  . Hypertension Father   . Diabetes Maternal Grandmother   . Stroke Maternal Grandmother   . Heart disease Paternal Grandmother   . Breast cancer Maternal Aunt 28  . Mental retardation Maternal Aunt   . Breast cancer Maternal Aunt 40       dx twice, another time in her 74s-50s    Social History   Tobacco Use  . Smoking status: Former Smoker    Packs/day: 0.25    Years: 1.00    Pack years: 0.25    Types: Cigarettes    Last attempt to quit: 05/22/2012    Years since quitting: 5.9  . Smokeless tobacco: Never Used  . Tobacco comment: Aug 2015  Substance Use Topics  . Alcohol use: No    Comment: No THC use since age 28  . Drug use: No    Types: Marijuana    Comment: not since age 28    Allergies:  Allergies  Allergen Reactions  . Influenza Vaccines Hives  . Spironolactone Other (See Comments)    "swimmy headed"   . Keflex [Cephalexin]   . Latex Rash     Medications Prior to Admission  Medication Sig Dispense Refill Last Dose  . prenatal vitamin w/FE, FA (PRENATAL 1 + 1) 27-1 MG TABS tablet Take 1 tablet by mouth daily at 12 noon.   04/19/2018 at Unknown time  . fluocinonide (LIDEX) 0.05 % external solution   3 More than a month at Unknown time  . fluticasone (FLONASE) 50 MCG/ACT nasal spray Place 2 sprays into both nostrils daily. 16 g 6 More than a month at Unknown time  . ketoconazole (NIZORAL) 2 % shampoo   2 More than a month at Unknown time  . levonorgestrel (MIRENA) 20 MCG/24HR IUD 1 each by Intrauterine route once.   More than a month at Unknown time  . levothyroxine (SYNTHROID, LEVOTHROID) 25 MCG tablet Take 1 tablet (25 mcg total) by mouth daily. 90 tablet 1 More than a month at Unknown time  . Multiple Vitamin (MULTIVITAMIN) capsule Take 1 capsule by mouth daily.   More than a month at Unknown time  . Norethindrone-Ethinyl Estradiol-Fe Biphas (LO LOESTRIN FE) 1 MG-10 MCG / 10 MCG tablet Take 1 tablet by mouth daily. 1 Package 11 More than a month  at Unknown time  . rizatriptan (MAXALT) 10 MG tablet May repeat in 2 hours if needed (Patient not taking: Reported on 02/04/2018) 10 tablet 0 More than a month at Unknown time  . Vitamin D, Ergocalciferol, (DRISDOL) 50000 units CAPS capsule 1 tab Every wed and sun 24 capsule 3 More than a month at Unknown time    Review of Systems  All other systems reviewed and are negative.  Physical Exam   Blood pressure 125/69, pulse 88, temperature 98.5 F (36.9 C), temperature source Oral, resp. rate 18, height 5\' 7"  (1.702 m), weight 82.1 kg, last menstrual period 03/07/2018, SpO2 98 %.  Physical Exam  Constitutional: She is oriented to person, place, and time. She appears well-developed and well-nourished. No distress.  HENT:  Head: Normocephalic and atraumatic.  Eyes: Pupils are equal, round, and reactive to light. Right eye exhibits no discharge. Left eye exhibits no discharge. No scleral  icterus.  Cardiovascular: Normal rate and regular rhythm.  Respiratory: Effort normal. No respiratory distress.  GI: Soft. She exhibits no distension. There is no abdominal tenderness.  Neurological: She is alert and oriented to person, place, and time.  Skin: She is not diaphoretic.  Psychiatric: She has a normal mood and affect. Her behavior is normal. Judgment and thought content normal.    MAU Course  Procedures  MDM Pregnancy of unknown location Ob US and hcg ordered  Assessment and Plan  Abdominal pain during pregnancy in first trimester Pregnancy of unknown anatomic location  - IUP at 6 weeks on [place] today - hcg pending on discharge - return precautions discussed - discharge home in stable condition - encourage patient to establish care with Ob - follow up PRN   Aura Camps 04/20/2018, 12:05 AM

## 2018-04-20 NOTE — Discharge Instructions (Signed)

## 2018-05-01 DIAGNOSIS — N912 Amenorrhea, unspecified: Secondary | ICD-10-CM | POA: Diagnosis not present

## 2018-05-05 DIAGNOSIS — Z369 Encounter for antenatal screening, unspecified: Secondary | ICD-10-CM | POA: Diagnosis not present

## 2018-05-05 DIAGNOSIS — Z3A09 9 weeks gestation of pregnancy: Secondary | ICD-10-CM | POA: Diagnosis not present

## 2018-05-05 DIAGNOSIS — Z3687 Encounter for antenatal screening for uncertain dates: Secondary | ICD-10-CM | POA: Diagnosis not present

## 2018-05-05 DIAGNOSIS — Z3201 Encounter for pregnancy test, result positive: Secondary | ICD-10-CM | POA: Diagnosis not present

## 2018-10-05 ENCOUNTER — Encounter: Payer: Self-pay | Admitting: Family Medicine

## 2018-10-06 NOTE — Telephone Encounter (Signed)
Pt states that she has hives and rash when she receives the flu vaccine.  Pt states that she received her first and last flu vaccine in 2017 and that was when she developed the rash and broke out in hives.  Charyl Bigger, CMA

## 2020-10-30 IMAGING — US OBSTETRIC <14 WK ULTRASOUND
1 series · 16 of 24 positions shown · non-contrast
Comparison: None.

CLINICAL DATA: Positive pregnancy test unknown dates

EXAM:
OBSTETRIC <14 WK ULTRASOUND
TECHNIQUE: Transabdominal ultrasound was performed for evaluation of the
gestation as well as the maternal uterus and adnexal regions.

[Series 1: obstetric <14 wk ultrasound · 24 acquisitions, 16 frames shown]
[im 1/24]
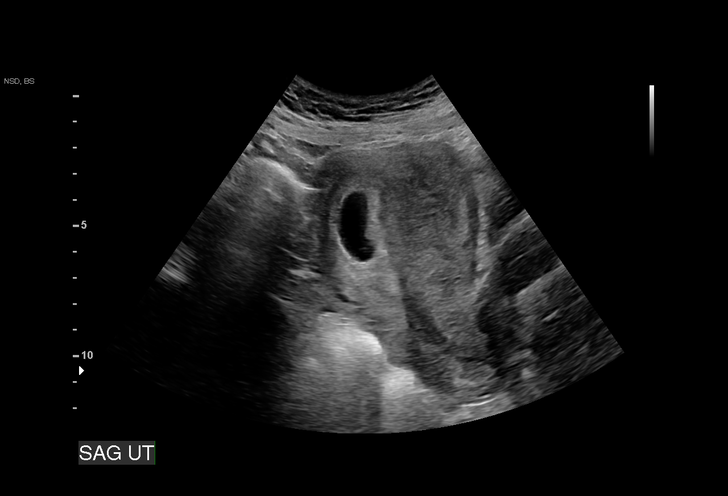
[im 3/24]
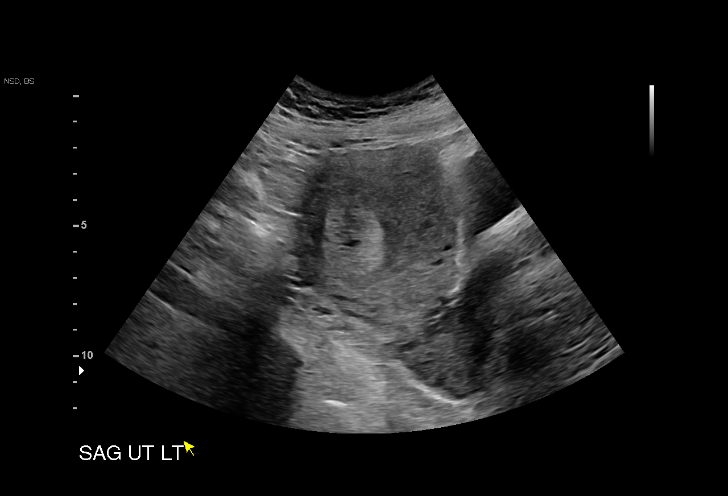
[im 4/24]
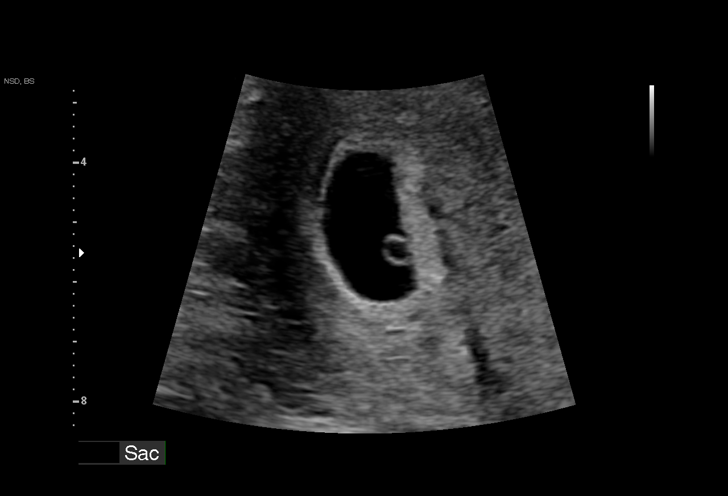
[im 6/24]
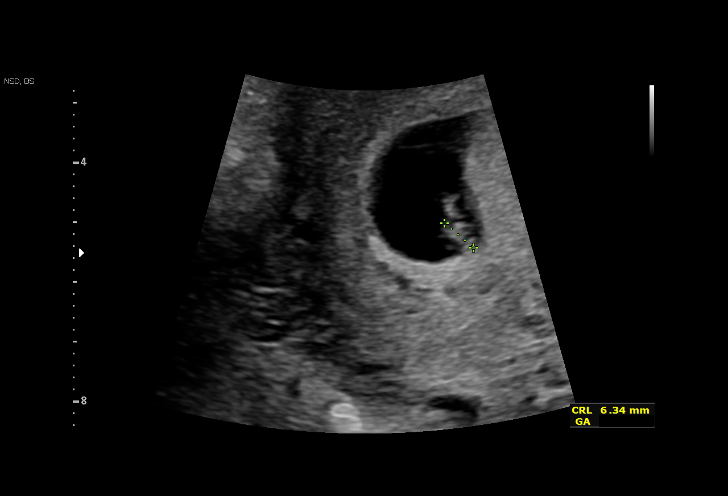
[im 7/24]
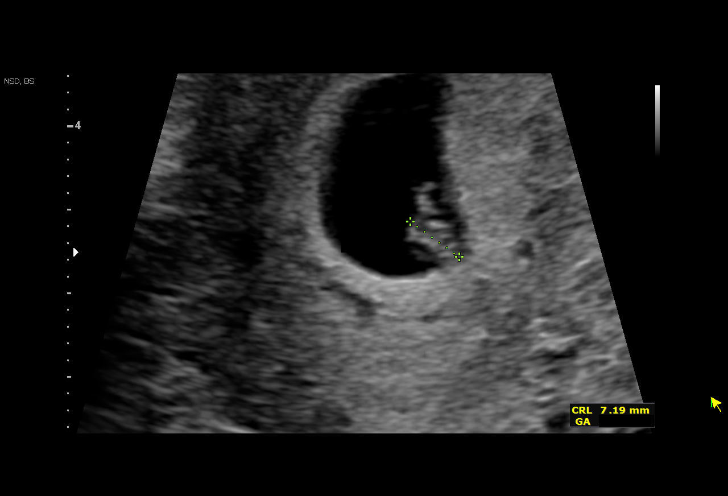
[im 9/24]
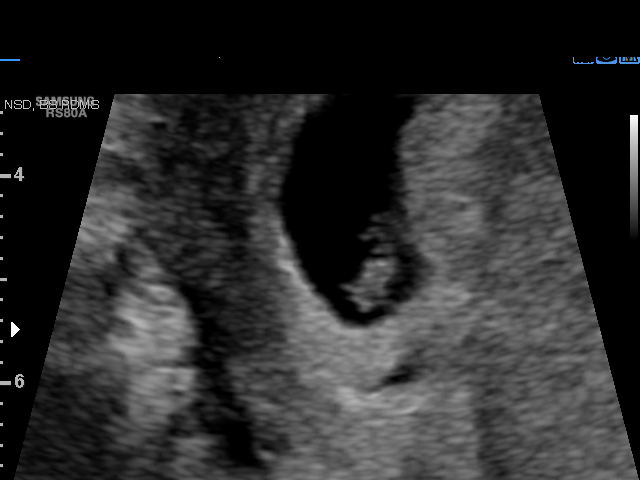
[im 10/24]
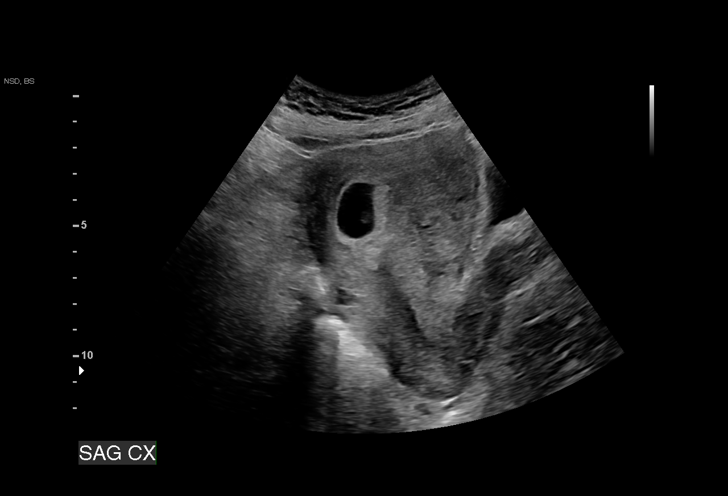
[im 12/24]
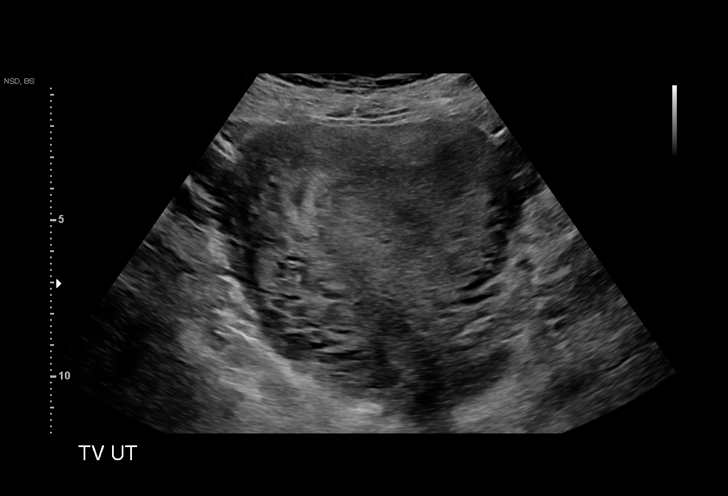
[im 13/24]
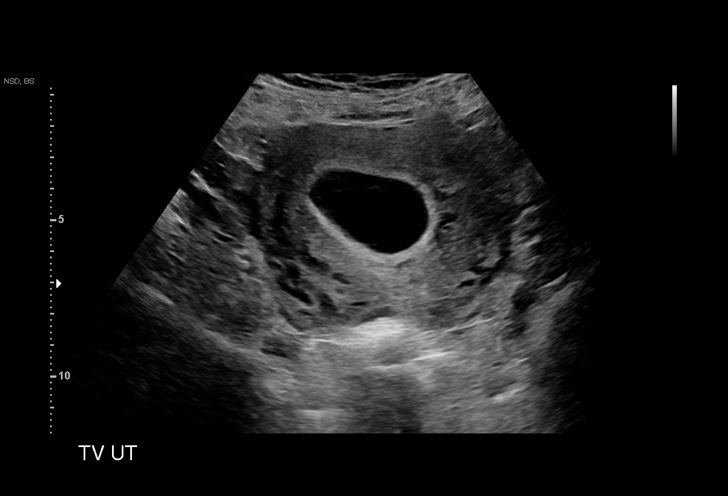
[im 15/24]
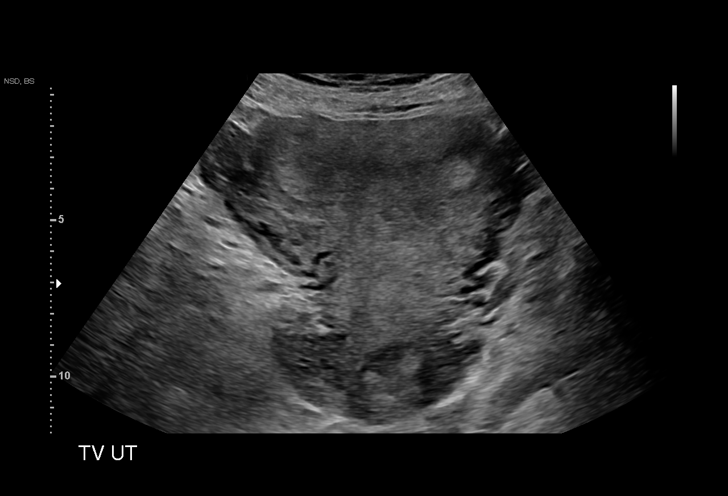
[im 16/24]
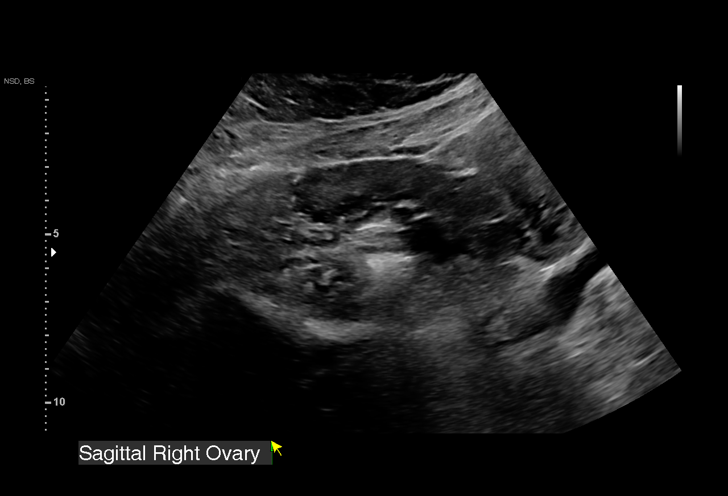
[im 18/24]
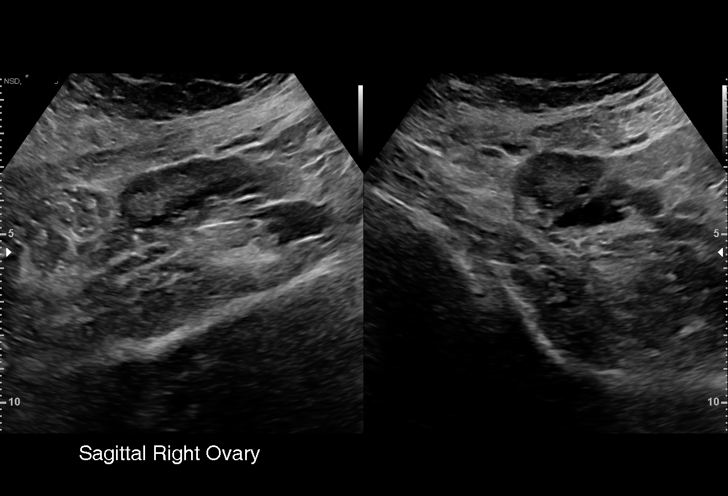
[im 19/24]
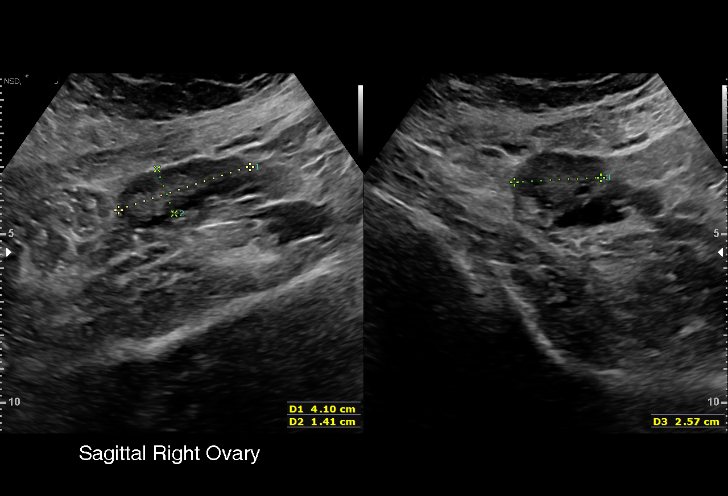
[im 21/24]
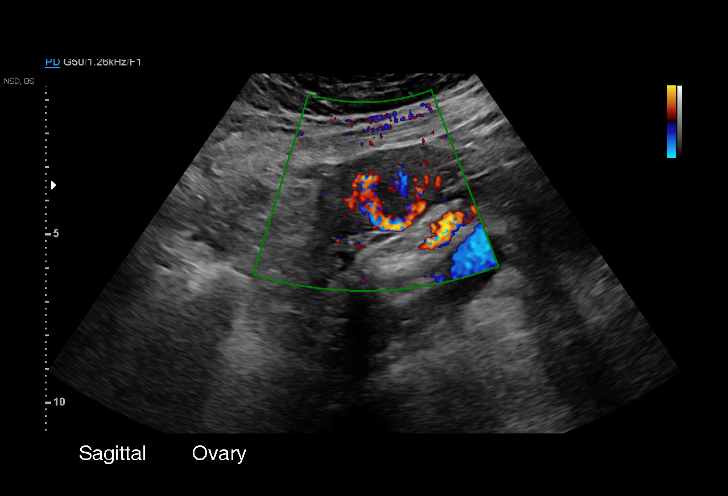
[im 22/24]
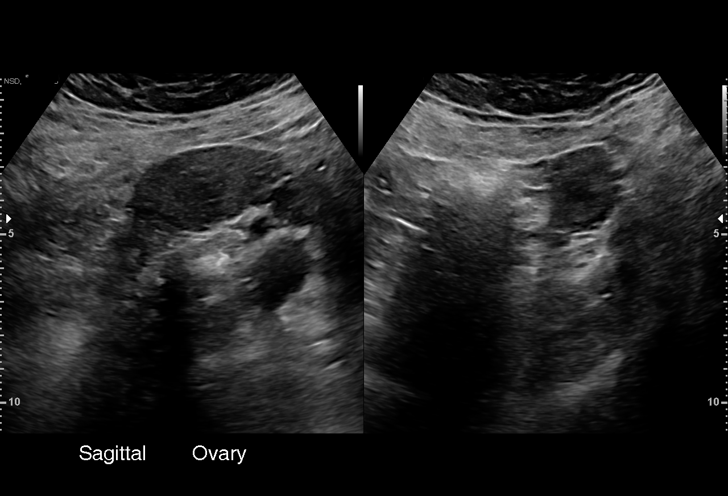
[im 24/24]
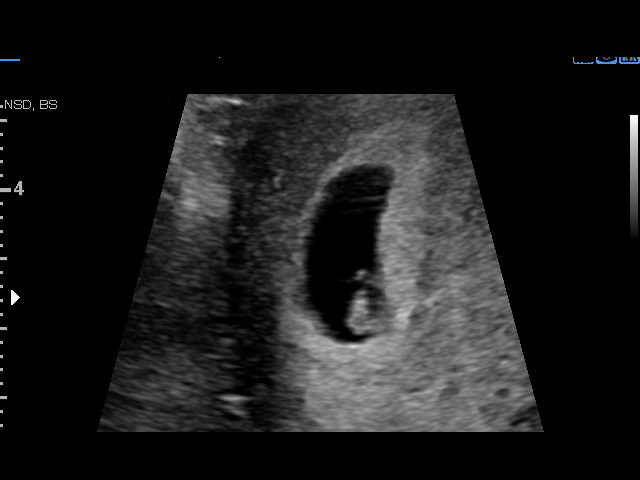

[16 of 24 positions shown; findings below may reference images not displayed]

FINDINGS: Intrauterine gestational sac: Present

Yolk sac:  Present

Embryo:  Present

Cardiac Activity: Present

Heart Rate: 132 bpm

CRL:   6.6 mm   6 w 3 d                  US EDC: 12/11/2018

Subchorionic hemorrhage:  None visualized.

Maternal uterus/adnexae: Within normal limits. No free fluid is
noted.
IMPRESSION: Single live intrauterine gestation at 6 weeks 3 days.
